# Patient Record
Sex: Female | Born: 1950
Health system: Southern US, Community
[De-identification: ages and names within clinical notes are randomized; demographics above are authoritative.]

## PROBLEM LIST (undated history)

## (undated) DIAGNOSIS — I10 Essential (primary) hypertension: Secondary | ICD-10-CM

## (undated) DIAGNOSIS — F419 Anxiety disorder, unspecified: Secondary | ICD-10-CM

## (undated) DIAGNOSIS — M1990 Unspecified osteoarthritis, unspecified site: Secondary | ICD-10-CM

## (undated) DIAGNOSIS — E079 Disorder of thyroid, unspecified: Secondary | ICD-10-CM

## (undated) DIAGNOSIS — E785 Hyperlipidemia, unspecified: Secondary | ICD-10-CM

## (undated) DIAGNOSIS — K219 Gastro-esophageal reflux disease without esophagitis: Secondary | ICD-10-CM

## (undated) DIAGNOSIS — E039 Hypothyroidism, unspecified: Secondary | ICD-10-CM

## (undated) DIAGNOSIS — S82851A Displaced trimalleolar fracture of right lower leg, initial encounter for closed fracture: Secondary | ICD-10-CM

## (undated) DIAGNOSIS — F32A Depression, unspecified: Secondary | ICD-10-CM

## (undated) HISTORY — PX: ABDOMINAL HYSTERECTOMY: SHX81

## (undated) HISTORY — PX: CHOLECYSTECTOMY: SHX55

## (undated) HISTORY — DX: Hyperlipidemia, unspecified: E78.5

## (undated) HISTORY — DX: Anxiety disorder, unspecified: F41.9

## (undated) HISTORY — DX: Essential (primary) hypertension: I10

## (undated) HISTORY — DX: Disorder of thyroid, unspecified: E07.9

---

## 2000-02-27 ENCOUNTER — Encounter: Payer: Self-pay | Admitting: Family Medicine

## 2000-02-27 ENCOUNTER — Encounter: Admission: RE | Admit: 2000-02-27 | Discharge: 2000-02-27 | Payer: Self-pay | Admitting: Family Medicine

## 2003-03-21 ENCOUNTER — Ambulatory Visit (HOSPITAL_BASED_OUTPATIENT_CLINIC_OR_DEPARTMENT_OTHER): Admission: RE | Admit: 2003-03-21 | Discharge: 2003-03-21 | Payer: Self-pay | Admitting: Orthopedic Surgery

## 2003-05-17 ENCOUNTER — Ambulatory Visit (HOSPITAL_BASED_OUTPATIENT_CLINIC_OR_DEPARTMENT_OTHER): Admission: RE | Admit: 2003-05-17 | Discharge: 2003-05-17 | Payer: Self-pay | Admitting: Orthopedic Surgery

## 2003-05-17 ENCOUNTER — Ambulatory Visit (HOSPITAL_COMMUNITY): Admission: RE | Admit: 2003-05-17 | Discharge: 2003-05-17 | Payer: Self-pay | Admitting: Orthopedic Surgery

## 2004-05-08 ENCOUNTER — Ambulatory Visit: Payer: Self-pay | Admitting: Family Medicine

## 2004-05-14 ENCOUNTER — Ambulatory Visit: Payer: Self-pay | Admitting: Family Medicine

## 2004-06-05 ENCOUNTER — Ambulatory Visit: Payer: Self-pay | Admitting: Family Medicine

## 2004-08-13 ENCOUNTER — Ambulatory Visit: Payer: Self-pay | Admitting: Family Medicine

## 2004-10-01 ENCOUNTER — Ambulatory Visit: Payer: Self-pay | Admitting: Family Medicine

## 2004-12-03 ENCOUNTER — Other Ambulatory Visit: Admission: RE | Admit: 2004-12-03 | Discharge: 2004-12-03 | Payer: Self-pay | Admitting: Obstetrics and Gynecology

## 2004-12-10 ENCOUNTER — Emergency Department (HOSPITAL_COMMUNITY): Admission: EM | Admit: 2004-12-10 | Discharge: 2004-12-10 | Payer: Self-pay | Admitting: Emergency Medicine

## 2004-12-11 ENCOUNTER — Ambulatory Visit: Payer: Self-pay | Admitting: Family Medicine

## 2005-01-02 ENCOUNTER — Ambulatory Visit: Payer: Self-pay | Admitting: Family Medicine

## 2005-05-13 ENCOUNTER — Ambulatory Visit: Payer: Self-pay | Admitting: Family Medicine

## 2005-06-17 ENCOUNTER — Ambulatory Visit: Payer: Self-pay | Admitting: Family Medicine

## 2005-06-23 ENCOUNTER — Other Ambulatory Visit: Admission: RE | Admit: 2005-06-23 | Discharge: 2005-06-23 | Payer: Self-pay | Admitting: Obstetrics and Gynecology

## 2005-08-12 ENCOUNTER — Ambulatory Visit: Payer: Self-pay | Admitting: Family Medicine

## 2005-10-01 ENCOUNTER — Ambulatory Visit: Payer: Self-pay | Admitting: Family Medicine

## 2005-11-18 ENCOUNTER — Ambulatory Visit: Payer: Self-pay | Admitting: Family Medicine

## 2005-12-08 ENCOUNTER — Other Ambulatory Visit: Admission: RE | Admit: 2005-12-08 | Discharge: 2005-12-08 | Payer: Self-pay | Admitting: Obstetrics and Gynecology

## 2006-06-04 ENCOUNTER — Ambulatory Visit: Payer: Self-pay | Admitting: Family Medicine

## 2006-09-01 ENCOUNTER — Ambulatory Visit: Payer: Self-pay | Admitting: Family Medicine

## 2007-03-29 ENCOUNTER — Ambulatory Visit: Payer: Self-pay | Admitting: Internal Medicine

## 2007-04-13 ENCOUNTER — Ambulatory Visit: Payer: Self-pay

## 2010-11-19 NOTE — Assessment & Plan Note (Signed)
[facility]                            CARDIOLOGY OFFICE NOTE   NAME:Copelin, Noely                            MRN:          [id number]  DATE:03/29/2007                            DOB:          16-Oct-1950    Ms. Byrd is a 60 year old who was referred by Dr. Caryl Never for  evaluation of chest pressure.   The patient has no known coronary artery disease.  She notes over the  past year episodes of chest discomfort.  She describes it as a  toothache that occurs on and off.  The episodes usually last about 30  minutes to an hour, not brought on by any particular activity, more  recently with increased frequency.   She has a chronic cough, question if this is exacerbating.  She also  notes over the past 3 years, she has been more tired.   She says she has a high pain tolerance and was concerned, and Dr.  Caryl Never again referred her for further evaluation.   CURRENT MEDICATIONS:  1. Synthroid 75 alternating with 25 daily.  2. Diovan/HCTZ 160/25 daily.  3. Vytorin 10/20.  4. Lexapro 10.   PAST MEDICAL HISTORY:  1. Hypothyroidism.  2. Hypertension.  3. Dyslipidemia.  4. Anxiety/depression.   PAST SURGICAL HISTORY:  1. Status post TAH.  2. Status post cholecystectomy.   SOCIAL HISTORY:  The patient does not smoke, does not drink.   FAMILY HISTORY:  Mother deceased at age 90, unknown causes.  Father with  known CAD, had a CABG at age 60, was a smoker.  Four sisters with  hypertension, arthritis.   REVIEW OF SYSTEMS:  All systems are reviewed, negative to the above  problem except as noted.   PHYSICAL EXAM:  The patient is in no distress.  Blood pressure is 160/100.  Pulse is 68.  Weight 163.  HEENT:  Normocephalic, atraumatic.  EOMI.  PERRL.  Throat clear, nares  clear, mucous membranes moist.  LUNGS:  Clear with no wheezes or rales.  CARDIAC:  Regular rate and rhythm, S1, S2, no S3, S4 or murmurs.  ABDOMEN:  Supple, nontender, no  hepatomegaly, no masses.  EXTREMITIES:  2+ pulses, no edema.   IMPRESSION:  Ms. Muela is a 60 year old woman with risk factors for  coronary artery disease as noted above.  Blood pressure is a little high  today.  It actually has been high in clinic as well.  She comes in for  evaluation of chest pressure that has atypical features but is  concerning since it has progressed, also with fatigability, shortness of  breath.   I would recommend scheduling the patient for a stress Myoview and also  an echocardiogram to evaluate.   In regard to her hypertension, she may need to be on another agent.  She  has, though, said that her blood pressure has been better controlled in  the past.  We will need to follow.  Again, I will be back in touch with  the patient.     Pricilla Riffle, MD, [facility]  Electronically Signed  PVR/MedQ  DD: 03/30/2007  DT: 03/30/2007  Job #: [id number]   cc:   Evelena Peat, M.D.

## 2010-11-22 NOTE — Op Note (Signed)
NAMECRYSTALMARIE, YASIN                             ACCOUNT NO.:  [id number]   MEDICAL RECORD NO.:  [id number]                   PATIENT TYPE:  AMB   LOCATION:  DSC                                  FACILITY:  [facility]   PHYSICIAN:  Cindee Salt, M.D.                    DATE OF BIRTH:  24-Feb-1951   DATE OF PROCEDURE:  03/21/2003  DATE OF DISCHARGE:                                 OPERATIVE REPORT   PREOPERATIVE DIAGNOSIS:  Carpal tunnel syndrome, mass, right wrist.   POSTOPERATIVE DIAGNOSIS:  Carpal tunnel syndrome, mass, right wrist.   OPERATION/PROCEDURE:  Decompression median nerve, right wrist.   SURGEON:  Cindee Salt, M.D.   ASSISTANT:  Abul Imam.   ANESTHESIA:  Forearm based IV regional.   INDICATIONS:  The patient is a 60year-old female with a history of carpal  tunnel syndrome.  EMG nerve conduction is positive.  A mass just proximal to  the wrist crease.  She is desirous for exploration and release.   DESCRIPTION OF PROCEDURE:  The patient was brought to the operating room  where forearm based IV regional anesthetic was carried out without  difficulty.  She was prepped and draped using DuraPrep in the supine  position, right arm free.  A longitudinal incision was made in the palm,  curved to the ulnar aspect, carried down through the subcutaneous tissue.  Bleeders were electrocauterized.  Palmar fascia was split.  Superficial  palmar arch identified.  Flexor tendon to the ring and little finger  identified to the ulnar side of the median nerve.  A carpal retinaculum was  incised with sharp dissection.  Right angle Sewall retractor was placed  between the skin and forearm fascia.  The fascia was released for  approximately 3 cm proximal to the wrist crease under direct vision.  The  canal was explored.  No further lesions were identified.  Tenosynovial  tissue was moderately thickened but not grossly enlarged.  Separate incision  was then made over the mass, carried down through  the subcutaneous tissue.  Bleeders again electrocauterized.  The palmar fascia was noted to be propped  up.  The fatty tissue was present beneath this but did not appear to be a  lipoma.  Using sharp dissection, a fullness in the flexor superficialis with  a very distal muscle belly was noted.  The canal was explored, the median  nerve identified and protected as was the ulnar nerve.  No further lesions  were identified.  Exploration carried down to the pronator.  The wounds were  copiously irrigated with saline and skin closed with interrupted 5-0 nylon  sutures.  Sterile compressive dressing and splint was applied.  The patient  tolerated the procedure well and was taken to the recovery room for  observation in satisfactory condition.   She is discharged to home to return to the [facility]  of [place] in one  week on Vicodin and Keflex.                                               Cindee Salt, M.D.    GK/MEDQ  D:  03/21/2003  T:  03/21/2003  Job:  [id number]

## 2010-11-22 NOTE — Op Note (Signed)
   NAMESHANOAH, ASBILL                             ACCOUNT NO.:  [id number]   MEDICAL RECORD NO.:  [id number]                   PATIENT TYPE:  AMB   LOCATION:  DSC                                  FACILITY:  [facility]   PHYSICIAN:  Cindee Salt, M.D.                    DATE OF BIRTH:  04-02-1951   DATE OF PROCEDURE:  05/17/2003  DATE OF DISCHARGE:                                 OPERATIVE REPORT   PREOPERATIVE DIAGNOSIS:  Carpal tunnel syndrome left hand.   POSTOPERATIVE DIAGNOSIS:  Carpal tunnel syndrome left hand.   OPERATION:  Release of left carpal tunnel.   SURGEON:  Cindee Salt, M.D.   ASSISTANTCarolyne Fiscal.   ANESTHESIA:  Forearm based IV regional.   HISTORY:  The patient is a 60 year old female with a history of carpal  tunnel syndrome. EMG nerve conduction is positive, which has not responded  to conservative treatment.   DESCRIPTION OF PROCEDURE:  The patient was brought to the operating room  with forearm based IV regional anesthetic carried out without difficulty.  She was prepped using Duraprep. In the supine position, the left arm free. A  longitudinal incision was made in the palm and carried down through the  subcutaneous tissue. Bleeders were electrocauterized. The palmar fascia was  split. The superficial palmar arch was identified. The flexor tendon to the  ring and little finger identified to the ulnar side of the median nerve. The  retinaculum was incised with sharp dissection. A right angle and Sewell  retractor were placed between the skin and forearm fascia. The fascia was  released for approximately 3 cm proximal to the wrist crease under direct  vision. The canal was explored. No further lesions were identified. The  wound was irrigated and the skin was closed with interrupted 5-0 nylon  sutures. A sterile compressive dressing and splint was applied. The patient  tolerated the procedure well and was taken to the recovery room for  observation in satisfactory  condition. It was noted that she had a  persistent median artery. This had not thrombosed. The patient is discharged  to home to return to the [facility] of [place] in one week. On Vicodin  and Keflex.                                               Cindee Salt, M.D.    GK/MEDQ  D:  05/17/2003  T:  05/17/2003  Job:  [id number]

## 2011-07-08 HISTORY — PX: CARPAL TUNNEL RELEASE: SHX101

## 2013-12-15 ENCOUNTER — Telehealth: Payer: Self-pay | Admitting: *Deleted

## 2013-12-15 NOTE — Telephone Encounter (Signed)
Called Catherine Mayer back. Neither one of [place] know why she was suppose to call our office.

## 2013-12-15 NOTE — Telephone Encounter (Signed)
Courtney from [facility], Dr. Fidel Levy called,   She said patient told her that they needed to get in contact with you, but she had no idea why?  She would like a call back. CB# [phone]

## 2013-12-19 ENCOUNTER — Encounter: Payer: Self-pay | Admitting: Internal Medicine

## 2013-12-19 ENCOUNTER — Ambulatory Visit (INDEPENDENT_AMBULATORY_CARE_PROVIDER_SITE_OTHER): Payer: BC Managed Care – PPO | Admitting: Internal Medicine

## 2013-12-19 ENCOUNTER — Ambulatory Visit
Admission: RE | Admit: 2013-12-19 | Discharge: 2013-12-19 | Disposition: A | Discharge status: Home / Self Care | Payer: BC Managed Care – PPO | Source: Ambulatory Visit | Attending: Internal Medicine | Admitting: Internal Medicine

## 2013-12-19 VITALS — BP 144/90 | HR 94 | Temp 98.3°F | Resp 16 | Ht 62.0 in | Wt 164.6 lb

## 2013-12-19 DIAGNOSIS — T17308A Unspecified foreign body in larynx causing other injury, initial encounter: Secondary | ICD-10-CM

## 2013-12-19 DIAGNOSIS — E0590 Thyrotoxicosis, unspecified without thyrotoxic crisis or storm: Secondary | ICD-10-CM

## 2013-12-19 DIAGNOSIS — E039 Hypothyroidism, unspecified: Secondary | ICD-10-CM | POA: Insufficient documentation

## 2013-12-19 LAB — TSH: TSH: 0.02 u[IU]/mL — ABNORMAL LOW (ref 0.35–4.50)

## 2013-12-19 LAB — T3, FREE: T3, Free: 3.5 pg/mL (ref 2.3–4.2)

## 2013-12-19 LAB — T4, FREE: Free T4: 1.88 ng/dL — ABNORMAL HIGH (ref 0.60–1.60)

## 2013-12-19 NOTE — Progress Notes (Addendum)
Patient ID: Catherine Mayer, female   DOB: 1950/09/23, 63 y.o.   MRN: [id number]   HPI  Catherine Mayer is a 63 y.o.-year-old female, self-referred in consultation for choking (? goiter/thyroid nodules).  Pt c/o: - + choking - + coughing up phlegm - thick mucus (for a long time) - + sharp pains in L side of neck - denies feeling nodules in neck - + ocasional hoarseness - + dysphagia/no odynophagia - + occasionally SOB with lying down.  Pt describes: - no heat/cold intolerance - + weight gain (50 lbs in last 10 years) - + fatigue - + decreased appetite - + poor sleep - occasionally diarrhea and constipation - + dry skin - + hair falling - + depression  No available TFT results.   She has no FH of thyroid disorders. No FH of thyroid cancer. No h/o radiation tx to head or neck. No recent use of iodine supplements.  ROS: Constitutional: see HPI Eyes: + blurry vision, no xerophthalmia ENT: no sore throat, see HPI Cardiovascular: no CP/+SOB/+ palpitations/no leg swelling Respiratory: + cough/no SOB/+ wheezing Gastrointestinal: no N/V/D/C, + GERD Musculoskeletal: + muscle aches/no joint aches Skin: no rashes, + easy bruising, + itching, + hair loss Neurological: + tremors/no numbness/tingling/dizziness, + HA Psychiatric: + depression/no anxiety  + low libido  PMH: - HTN - HL  - vit D def - GERD  PSxH: - TAH 1977 - cholecystectomy  History   Social History  . Marital Status: Married    Spouse Name: N/A    Number of Children: 1, 944 y/o   Occupational History  . Sales - furniture    Social History Main Topics  . Smoking status: Never Smoker   . Smokeless tobacco: No  . Alcohol Use: No  . Drug Use: No   Current Outpatient Rx  Name  Route  Sig  Dispense  Refill  . escitalopram (LEXAPRO) 20 MG tablet               . LORazepam (ATIVAN) 0.5 MG tablet               . losartan-hydrochlorothiazide (HYZAAR) 100-25 MG per tablet               . omeprazole  (PRILOSEC) 20 MG capsule               . pravastatin (PRAVACHOL) 80 MG tablet               . Vitamin D, Ergocalciferol, (DRISDOL) 50000 UNITS CAPS capsule                FH: - sister with DM - mother, sister, father with HTN, HL - father with heart disease  NKDA  PE: BP 144/90  Pulse 94  Temp(Src) 98.3 F (36.8 C)  Resp 16  Ht 5\' 2"  (1.575 m)  Wt 164 lb 9.6 oz (74.662 kg)  BMI 30.10 kg/m2  SpO2 96% Wt Readings from Last 3 Encounters:  12/19/13 164 lb 9.6 oz (74.662 kg)   Constitutional: overweight, in NAD Eyes: PERRLA, EOMI, no exophthalmos ENT: moist mucous membranes, + slight thyromegaly, no cervical lymphadenopathy Cardiovascular: RRR, No MRG Respiratory: CTA B Gastrointestinal: abdomen soft, NT, ND, BS+ Musculoskeletal: no deformities, strength intact in all 4 Skin: moist, warm, no rashes Neurological: slight tremor with outstretched hands, DTR normal in all 4  ASSESSMENT: 1. Choking - Pt believes her thyroid is causing her choking - would like to check on this  PLAN:  1. Patient with neck compression sxs - I cannot palpate nodules, only slight thyromegaly - we discussed to get a thyroid U/S to check for nodules. If this is normal, we may need a Ba swallow to see if internal or external compression. We scheduled this for today. - we'll check thyroid tests today: TSH, free T4, free T3 - If these are abnormal, she will need to return in 6-8 weeks for repeat labs - we will schedule a new appt if the above labs or U/S are normal  CLINICAL DATA: History of choking. Evaluate for thyroid nodularity.  EXAM: THYROID ULTRASOUND  TECHNIQUE: Ultrasound examination of the thyroid gland and adjacent soft tissues was performed.  COMPARISON: None.  FINDINGS: Right thyroid lobe  Measurements: 3.5 x 0.8 x 1.2 cm. Diffusely heterogeneous thyroid parenchyma. The thyroid is largely hypoechoic with bands of increased echogenicity. No nodules  visualized.  Left thyroid lobe  Measurements: 3.9 x 0.8 x 1.4 cm. Diffusely heterogeneous thyroid parenchyma. The thyroid is largely hypoechoic with bands of increased echogenicity. No nodules visualized.  Isthmus  Thickness: 0.2 cm. No nodules visualized.  Lymphadenopathy  None visualized.  IMPRESSION: 1. No discrete thyroid nodule identified. 2. Diffusely heterogeneous hypoechoic thyroid gland with bands of increased echogenicity. While nonspecific, these findings can be seen in the setting of chronic thyroiditis such as Hashimoto's thyroiditis.   Electronically Signed By: Malachy MoanHeath McCullough M.D. On: 12/19/2013 11:32  Based on the results above, will add a TPO Abs level to the labs drawn, since the U/S suggests Hashimoto's thyroiditis. Will also need the barium swallow (ordered).  Component     Latest Ref Rng 12/19/2013  TSH     0.35 - 4.50 uIU/mL 0.02 (L)  Free T4     0.60 - 1.60 ng/dL 4.091.88 (H)  T3, Free     2.3 - 4.2 pg/mL 3.5  TPO Abs not added.  Likely thyroiditis >> will suggest pt to use Ibuprofen to decrease swelling and will recheck the TFTs + TPO Abs + TSI Abs + ATA in 3 weeks.  01/17/2014  CLINICAL DATA: History of choking, fullness in the throat  EXAM: ESOPHOGRAM / BARIUM SWALLOW / BARIUM TABLET STUDY  TECHNIQUE: Combined double contrast and single contrast examination performed using effervescent crystals, thick barium liquid, and thin barium liquid. The patient was observed with fluoroscopy swallowing a 13mm barium sulphate tablet.  FLUOROSCOPY TIME: 1 min 42 seconds  COMPARISON: None.  FINDINGS: With the history of coughing after swallowing, the study was begun in the lateral projection. Rapid sequence spot films show no evidence of aspiration or penetration. The swallowing mechanism is unremarkable. A double contrast study shows the mucosa of the esophagus to be normal. Esophageal peristalsis is normal. However, there is a small hiatal  hernia present. Moderate gastroesophageal reflux is demonstrated. A barium pill was given at the end of the study which passed into the stomach without delay.  IMPRESSION: 1. Small hiatal hernia with moderate gastroesophageal reflux. 2. Barium pill passes into the stomach without delay. 3. No evidence of aspiration   Electronically Signed By: Dwyane DeePaul Barry M.D. On: 01/17/2014 13:26  No extrinsic Esophageal compression on Ba Esophagogram. Chocking possibly from GERD. She is on Protonix.  Will FWD to PCP.

## 2013-12-19 NOTE — Patient Instructions (Addendum)
Please stop at the lab. We will call you with the results. We scheduled your thyroid U/S for today at 10:00 am. After this, we may need a barium swallow test, but let's wait for the results of the U/S and the thyroid tests. We will schedule a new appointment if the labs or ultrasound are abnormal.

## 2013-12-29 DIAGNOSIS — T17308A Unspecified foreign body in larynx causing other injury, initial encounter: Secondary | ICD-10-CM | POA: Insufficient documentation

## 2014-01-16 ENCOUNTER — Other Ambulatory Visit: Payer: BC Managed Care – PPO

## 2014-01-17 ENCOUNTER — Ambulatory Visit
Admission: RE | Admit: 2014-01-17 | Discharge: 2014-01-17 | Disposition: A | Discharge status: Home / Self Care | Payer: BC Managed Care – PPO | Source: Ambulatory Visit | Attending: Internal Medicine | Admitting: Internal Medicine

## 2014-01-18 ENCOUNTER — Encounter: Payer: Self-pay | Admitting: *Deleted

## 2014-01-20 ENCOUNTER — Telehealth: Payer: Self-pay | Admitting: Internal Medicine

## 2014-01-20 NOTE — Telephone Encounter (Signed)
Called pt and advised her per Dr Charlean SanfilippoGherghe's note. Pt understood and said she will be here.

## 2014-01-20 NOTE — Telephone Encounter (Signed)
Yes, to see how she is feeling and repeat all the labs.

## 2014-01-20 NOTE — Telephone Encounter (Signed)
Since pts labs came back good does she still need to come in on 7/28

## 2014-01-20 NOTE — Telephone Encounter (Signed)
Please read note below and advise.  

## 2014-01-31 ENCOUNTER — Other Ambulatory Visit: Payer: Self-pay | Admitting: Internal Medicine

## 2014-01-31 ENCOUNTER — Encounter: Payer: Self-pay | Admitting: Internal Medicine

## 2014-01-31 ENCOUNTER — Ambulatory Visit (INDEPENDENT_AMBULATORY_CARE_PROVIDER_SITE_OTHER): Payer: BC Managed Care – PPO | Admitting: Internal Medicine

## 2014-01-31 VITALS — BP 174/104 | HR 77 | Temp 97.9°F | Resp 12 | Wt 166.0 lb

## 2014-01-31 DIAGNOSIS — Z5189 Encounter for other specified aftercare: Secondary | ICD-10-CM

## 2014-01-31 DIAGNOSIS — T17308A Unspecified foreign body in larynx causing other injury, initial encounter: Secondary | ICD-10-CM

## 2014-01-31 DIAGNOSIS — T17308D Unspecified foreign body in larynx causing other injury, subsequent encounter: Secondary | ICD-10-CM

## 2014-01-31 DIAGNOSIS — E0590 Thyrotoxicosis, unspecified without thyrotoxic crisis or storm: Secondary | ICD-10-CM

## 2014-01-31 DIAGNOSIS — E039 Hypothyroidism, unspecified: Secondary | ICD-10-CM

## 2014-01-31 LAB — TSH: TSH: 3.507 u[IU]/mL (ref 0.350–4.500)

## 2014-01-31 LAB — T4, FREE: Free T4: 1.04 ng/dL (ref 0.80–1.80)

## 2014-01-31 MED ORDER — LEVOTHYROXINE SODIUM 125 MCG PO TABS: 125.0000 ug | ORAL_TABLET | Freq: Every day | ORAL | Status: DC

## 2014-01-31 NOTE — Patient Instructions (Signed)
Please separate the Synthroid from Omeprazole and take Synthroid in am, fasting, with water. Please stop at the lab downstairs [facility]  Please come back for a follow-up appointment in 3 months

## 2014-01-31 NOTE — Progress Notes (Signed)
Patient ID: Catherine Mayer, female   DOB: 12-06-1950, 63 y.o.   MRN: [id number]   HPI  Catherine Mayer is a 63 y.o.-year-old female, returning for f/u for hypothyroidism and choking. Lats visit 1.5 mo ago.   Pt still c/o choking. - denies feeling nodules in neck - + ocasional hoarseness - + dysphagia/no odynophagia - + occasionally SOB with lying down.  At last visit, we checked a thyroid U/S (no nodules) and a barium swallow test (no exogenous compression on the esophagus, only a small hiatal hernia). It is possible that her neck compression sxs can be fluctuating ~ amount of inflammation in her thyroid - she had a heterogeneous aspect of her thyroid on U/S suggesting thyroiditis, likely Hashimoto's thyroiditis.   She tells me that she believes her choking may have a nervous component >> she felt this after getting out of the car to come to the appt.  Hypothyroidism: One thing that she forgot to mention at our last appt is that she was on Synthroid 125 mcg daily! This med did not appear in her med list and did not tell me she was on it.   She now tells me that she was dx'ed with hypothyroidism 6 years ago >> started Synthroid (DAW) - 125 mcg. She is still taking this dose now: - at bedtime (2-3 hours after dinner)! - with water - takes it along with Prilosec!  Previous TFT results were reviewed; Component     Latest Ref Rng 12/19/2013  TSH     0.35 - 4.50 uIU/mL 0.02 (L)  Free T4     0.60 - 1.60 ng/dL 1.91 (H)  T3, Free     2.3 - 4.2 pg/mL 3.5   Pt describes: - no heat/cold intolerance - + weight gain (50 lbs in last 10 years) - + fatigue - + poor sleep - occasionally diarrhea and constipation - + dry skin - + hair falling - + depression  ROS: Constitutional: see HPI Eyes: no blurry vision, no xerophthalmia ENT: no sore throat, see HPI Cardiovascular: no CP/SOB/no palpitations/no leg swelling Respiratory: no cough/no SOB/wheezing Gastrointestinal: no N/V/D/C, +  GERD Musculoskeletal: no muscle aches/no joint aches Skin: no rashes, + easy bruising Neurological: + tremors/no numbness/tingling/dizziness  I reviewed pt's medications, allergies, PMH, social hx, family hx and no changes required, except as mentioned above.  PE: BP 174/104  Pulse 77  Temp(Src) 97.9 F (36.6 C) (Oral)  Resp 12  Wt 166 lb (75.297 kg)  SpO2 95% Wt Readings from Last 3 Encounters:  01/31/14 166 lb (75.297 kg)  12/19/13 164 lb 9.6 oz (74.662 kg)   Constitutional: overweight, in NAD Eyes: PERRLA, EOMI, no exophthalmos ENT: moist mucous membranes, + slight thyromegaly, no cervical lymphadenopathy Cardiovascular: RRR, No MRG Respiratory: CTA B Gastrointestinal: abdomen soft, NT, ND, BS+ Musculoskeletal: no deformities, strength intact in all 4 Skin: moist, warm, no rashes Neurological: slight tremor with outstretched hands, DTR normal in all 4  ASSESSMENT: 1. Choking - Pt believes her thyroid is causing her choking - we performed the following tests:  THYROID ULTRASOUND (12/19/2013)  FINDINGS: Right thyroid lobe  Measurements: 3.5 x 0.8 x 1.2 cm. Diffusely heterogeneous thyroid parenchyma. The thyroid is largely hypoechoic with bands of increased echogenicity. No nodules visualized.  Left thyroid lobe  Measurements: 3.9 x 0.8 x 1.4 cm. Diffusely heterogeneous thyroid parenchyma. The thyroid is largely hypoechoic with bands of increased echogenicity. No nodules visualized.  Isthmus  Thickness: 0.2 cm. No nodules visualized.  Lymphadenopathy  None visualized.  IMPRESSION: 1. No discrete thyroid nodule identified. 2. Diffusely heterogeneous hypoechoic thyroid gland with bands of increased echogenicity. While nonspecific, these findings can be seen in the setting of chronic thyroiditis such as Hashimoto's thyroiditis.  Based on the results above, will add a TPO Abs level to the labs drawn, since the U/S suggests Hashimoto's  thyroiditis.  ESOPHOGRAM / BARIUM SWALLOW / BARIUM TABLET STUDY (01/17/2014)  FINDINGS: With the history of coughing after swallowing, the study was begun in the lateral projection. Rapid sequence spot films show no evidence of aspiration or penetration. The swallowing mechanism is unremarkable. A double contrast study shows the mucosa of the esophagus to be normal. Esophageal peristalsis is normal. However, there is a small hiatal hernia present. Moderate gastroesophageal reflux is demonstrated. A barium pill was given at the end of the study which passed into the stomach without delay.  IMPRESSION: 1. Small hiatal hernia with moderate gastroesophageal reflux. 2. Barium pill passes into the stomach without delay. 3. No evidence of aspiration  No extrinsic Esophageal compression on Ba Esophagogram.   2. Hypothyroidism - On DAW Synthroid 125 mcg  Component     Latest Ref Rng 12/19/2013  TSH     0.35 - 4.50 uIU/mL 0.02 (L)  Free T4     0.60 - 1.60 ng/dL 1.611.88 (H)  T3, Free     2.3 - 4.2 pg/mL 3.5    PLAN:  1. Choking - Patient with neck compression sxs - thyroid U/S shows no nodules; Ba swallow shows no compression. Possible nervous component.  2. Hypothyroidism - per last TFT check, her TSH is oversuppressed on her current Synthroid dose. This is also in the context of taking the PPI along with the Synthroid after dinner! - we discussed about risk of having an overly suppressed TSH: tachycardia, arrhythmia, hypercoagulability >> blood clots, osteoporosis, + hyperthyroid sxs. - we discussed about correct intake of thyroid hh: EVERY DAY, with water, >30 min before b'fast, separated by >4h from anti acid medication, calcium, iron, MVI. - will check thyroid tests today: TSH, free T4 - If these are abnormal, which I suspect, she will need to return in 6-8 weeks for repeat labs, after we change the Synthroid dose - we will schedule a new appt in 3 mo  Orders Only on 01/31/2014   Component Date Value Ref Range Status  . TSH 01/31/2014 3.507  0.350 - 4.500 uIU/mL Final  . Free T4 01/31/2014 1.04  0.80 - 1.80 ng/dL Final   Since TSH a little higher in the normal range >> we will continue the same dose of Synthroid (125 mcg), but separating it from the PPI >> we will need to recheck labs in 5-6 weeks.

## 2014-05-01 ENCOUNTER — Ambulatory Visit: Payer: BC Managed Care – PPO | Admitting: Internal Medicine

## 2015-02-14 ENCOUNTER — Other Ambulatory Visit (HOSPITAL_COMMUNITY): Payer: Self-pay | Admitting: Emergency Medicine

## 2015-02-14 ENCOUNTER — Ambulatory Visit (HOSPITAL_COMMUNITY)
Admission: RE | Admit: 2015-02-14 | Discharge: 2015-02-14 | Disposition: A | Discharge status: Home / Self Care | Payer: Self-pay | Source: Ambulatory Visit | Attending: Emergency Medicine | Admitting: Emergency Medicine

## 2015-02-14 DIAGNOSIS — R29810 Facial weakness: Secondary | ICD-10-CM

## 2016-04-22 ENCOUNTER — Encounter: Payer: Self-pay | Admitting: Family Medicine

## 2016-05-05 ENCOUNTER — Other Ambulatory Visit: Payer: Self-pay | Admitting: Obstetrics and Gynecology

## 2016-05-05 DIAGNOSIS — R928 Other abnormal and inconclusive findings on diagnostic imaging of breast: Secondary | ICD-10-CM

## 2016-05-07 ENCOUNTER — Other Ambulatory Visit: Payer: Self-pay | Admitting: Obstetrics and Gynecology

## 2016-05-07 ENCOUNTER — Ambulatory Visit
Admission: RE | Admit: 2016-05-07 | Discharge: 2016-05-07 | Disposition: A | Discharge status: Home / Self Care | Payer: Medicare Other | Source: Ambulatory Visit | Attending: Obstetrics and Gynecology | Admitting: Obstetrics and Gynecology

## 2016-05-07 DIAGNOSIS — R928 Other abnormal and inconclusive findings on diagnostic imaging of breast: Secondary | ICD-10-CM

## 2016-05-12 ENCOUNTER — Other Ambulatory Visit: Payer: Self-pay | Admitting: Obstetrics and Gynecology

## 2016-05-12 DIAGNOSIS — R928 Other abnormal and inconclusive findings on diagnostic imaging of breast: Secondary | ICD-10-CM

## 2016-05-13 ENCOUNTER — Ambulatory Visit
Admission: RE | Admit: 2016-05-13 | Discharge: 2016-05-13 | Disposition: A | Discharge status: Home / Self Care | Payer: Medicare Other | Source: Ambulatory Visit | Attending: Obstetrics and Gynecology | Admitting: Obstetrics and Gynecology

## 2016-05-13 DIAGNOSIS — R928 Other abnormal and inconclusive findings on diagnostic imaging of breast: Secondary | ICD-10-CM

## 2016-07-22 DIAGNOSIS — E559 Vitamin D deficiency, unspecified: Secondary | ICD-10-CM | POA: Insufficient documentation

## 2016-07-22 DIAGNOSIS — E785 Hyperlipidemia, unspecified: Secondary | ICD-10-CM | POA: Insufficient documentation

## 2016-07-22 DIAGNOSIS — F3342 Major depressive disorder, recurrent, in full remission: Secondary | ICD-10-CM | POA: Insufficient documentation

## 2016-07-22 DIAGNOSIS — I10 Essential (primary) hypertension: Secondary | ICD-10-CM | POA: Insufficient documentation

## 2018-06-11 ENCOUNTER — Ambulatory Visit: Payer: Medicare Other | Admitting: Diagnostic Neuroimaging

## 2018-07-02 ENCOUNTER — Emergency Department (HOSPITAL_COMMUNITY)
Admission: EM | Admit: 2018-07-02 | Discharge: 2018-07-02 | Disposition: A | Discharge status: Home / Self Care | Payer: Medicare Other | Attending: Emergency Medicine | Admitting: Emergency Medicine

## 2018-07-02 ENCOUNTER — Encounter (HOSPITAL_COMMUNITY): Payer: Self-pay | Admitting: Emergency Medicine

## 2018-07-02 ENCOUNTER — Emergency Department (HOSPITAL_COMMUNITY): Payer: Medicare Other

## 2018-07-02 DIAGNOSIS — E876 Hypokalemia: Secondary | ICD-10-CM | POA: Diagnosis not present

## 2018-07-02 DIAGNOSIS — Z79899 Other long term (current) drug therapy: Secondary | ICD-10-CM | POA: Insufficient documentation

## 2018-07-02 DIAGNOSIS — F410 Panic disorder [episodic paroxysmal anxiety] without agoraphobia: Secondary | ICD-10-CM | POA: Insufficient documentation

## 2018-07-02 DIAGNOSIS — E039 Hypothyroidism, unspecified: Secondary | ICD-10-CM | POA: Diagnosis not present

## 2018-07-02 DIAGNOSIS — R0602 Shortness of breath: Secondary | ICD-10-CM | POA: Diagnosis present

## 2018-07-02 LAB — BASIC METABOLIC PANEL
Anion gap: 14 (ref 5–15)
BUN: 23 mg/dL (ref 8–23)
CHLORIDE: 99 mmol/L (ref 98–111)
CO2: 26 mmol/L (ref 22–32)
Calcium: 8.4 mg/dL — ABNORMAL LOW (ref 8.9–10.3)
Creatinine, Ser: 0.99 mg/dL (ref 0.44–1.00)
GFR calc Af Amer: >60 mL/min (ref >60)
GFR calc non Af Amer: 59 mL/min — ABNORMAL LOW (ref >60)
GLUCOSE: 108 mg/dL — AB (ref 70–99)
POTASSIUM: 2.8 mmol/L — AB (ref 3.5–5.1)
Sodium: 139 mmol/L (ref 135–145)

## 2018-07-02 LAB — I-STAT TROPONIN, ED: Troponin i, poc: 0 ng/mL (ref 0.00–0.08)

## 2018-07-02 LAB — CBC
HEMATOCRIT: 38.4 % (ref 36.0–46.0)
HEMOGLOBIN: 13.3 g/dL (ref 12.0–15.0)
MCH: 32.2 pg (ref 26.0–34.0)
MCHC: 34.6 g/dL (ref 30.0–36.0)
MCV: 93 fL (ref 80.0–100.0)
Platelets: 208 10*3/uL (ref 150–400)
RBC: 4.13 MIL/uL (ref 3.87–5.11)
RDW: 12.4 % (ref 11.5–15.5)
WBC: 5.6 10*3/uL (ref 4.0–10.5)
nRBC: 0 % (ref 0.0–0.2)

## 2018-07-02 MED ORDER — POTASSIUM CHLORIDE ER 10 MEQ PO TBCR: 20.0000 meq | EXTENDED_RELEASE_TABLET | Freq: Every day | ORAL | 0 refills | Status: DC

## 2018-07-02 MED ORDER — LORAZEPAM 0.5 MG PO TABS: 0.5000 mg | ORAL_TABLET | Freq: Every day | ORAL | 0 refills | Status: DC | PRN

## 2018-07-02 MED ORDER — POTASSIUM CHLORIDE CRYS ER 20 MEQ PO TBCR: 40.0000 meq | EXTENDED_RELEASE_TABLET | Freq: Once | ORAL | Status: DC

## 2018-07-02 NOTE — ED Provider Notes (Signed)
[facility] Provider Note   CSN: [id number] Arrival date & time: 07/02/18  1616     History   Chief Complaint Chief Complaint  Patient presents with  . Shortness of Breath    HPI Catherine Mayer Comes is a 67 y.o. female.  Pt presents to the ED today with sob.  The pt has had a viral GI bug (N/v/d) for the past few days.  Today, she woke up from sleep around 0400 and felt like she could not get her breath.  She has a hx of anxiety and felt like this was a panic attack.  She said she breathed through it, and it went away.  She saw her pcp today for the vomiting and diarrhea.  She was given zofran and was able to tolerate gatorade.  She was feeling better, but then the same thing happened around 1400.  She breathed through it again, and it went away.  She felt like she should get it checked out.  She feels fine now.     History reviewed. No pertinent past medical history.  Patient Active Problem List   Diagnosis Date Noted  . Choking 12/29/2013  . Hypothyroidism 12/19/2013    Past Surgical History:  Procedure Laterality Date  . ABDOMINAL HYSTERECTOMY    . CHOLECYSTECTOMY       OB History   No obstetric history on file.      Home Medications    Prior to Admission medications   Medication Sig Start Date End Date Taking? Authorizing Provider  escitalopram (LEXAPRO) 20 MG tablet Take 20 mg by mouth daily.  11/29/13  Yes [provider]  levothyroxine (SYNTHROID) 125 MCG tablet Take 1 tablet (125 mcg total) by mouth daily before breakfast. 01/31/14  Yes Carlus PavlovGherghe, Cristina, MD  losartan-hydrochlorothiazide (HYZAAR) 100-25 MG per tablet Take 1 tablet by mouth daily.  11/25/13  Yes [provider]  meloxicam (MOBIC) 15 MG tablet Take 15 mg by mouth daily as needed for pain. 03/09/18  Yes [provider]  omeprazole (PRILOSEC) 20 MG capsule Take 20 mg by mouth daily.  11/29/13  Yes [provider]  ondansetron  (ZOFRAN-ODT) 8 MG disintegrating tablet Take 8 mg by mouth every 8 (eight) hours as needed for nausea/vomiting. 07/02/18 07/09/18 Yes [provider]  pravastatin (PRAVACHOL) 80 MG tablet Take 80 mg by mouth daily.  10/30/13  Yes [provider]  Vitamin D, Ergocalciferol, (DRISDOL) 50000 UNITS CAPS capsule Take 50,000 Units by mouth every 7 (seven) days. SUNDAYS 10/28/13  Yes [provider]  LORazepam (ATIVAN) 0.5 MG tablet Take 1 tablet (0.5 mg total) by mouth daily as needed for anxiety. 07/02/18   Jacalyn LefevreHaviland, Lileigh Fahringer, MD  potassium chloride (K-DUR) 10 MEQ tablet Take 2 tablets (20 mEq total) by mouth daily. 07/02/18   Jacalyn LefevreHaviland, Aizlynn Digilio, MD    Family History No family history on file.  Social History Social History   Tobacco Use  . Smoking status: Never Smoker  . Smokeless tobacco: Never Used  Substance Use Topics  . Alcohol use: Not on file  . Drug use: Never     Allergies   Patient has no known allergies.   Review of Systems Review of Systems  Respiratory: Positive for shortness of breath.   All other systems reviewed and are negative.    Physical Exam Updated Vital Signs BP (!) 165/111 (BP Location: Right Arm)   Pulse 88   Temp 98.9 F (37.2 C) (Oral)   Resp  18   Ht 5' (1.524 m)   Wt 72.6 kg   SpO2 96%   BMI 31.25 kg/m   Physical Exam Vitals signs and nursing note reviewed.  Constitutional:      Appearance: She is well-developed.  HENT:     Head: Normocephalic and atraumatic.     Mouth/Throat:     Mouth: Mucous membranes are moist.     Pharynx: Oropharynx is clear.  Eyes:     Extraocular Movements: Extraocular movements intact.     Pupils: Pupils are equal, round, and reactive to light.  Neck:     Musculoskeletal: Normal range of motion and neck supple.  Cardiovascular:     Rate and Rhythm: Normal rate.  Pulmonary:     Effort: Pulmonary effort is normal.     Breath sounds: Normal breath sounds.  Abdominal:     General: Bowel  sounds are normal.     Palpations: Abdomen is soft.  Musculoskeletal: Normal range of motion.  Skin:    General: Skin is warm and dry.     Capillary Refill: Capillary refill takes less than 2 seconds.  Neurological:     General: No focal deficit present.     Mental Status: She is alert and oriented to person, place, and time.  Psychiatric:        Mood and Affect: Mood normal.        Behavior: Behavior normal.      ED Treatments / Results  Labs (all labs ordered are listed, but only abnormal results are displayed) Labs Reviewed  BASIC METABOLIC PANEL - Abnormal; Notable for the following components:      Result Value   Potassium 2.8 (*)    Glucose, Bld 108 (*)    Calcium 8.4 (*)    GFR calc non Af Amer 59 (*)    All other components within normal limits  CBC  I-STAT TROPONIN, ED    EKG EKG Interpretation  Date/Time:  Friday July 02 2018 16:22:37 EST Ventricular Rate:  89 PR Interval:  144 QRS Duration: 82 QT Interval:  380 QTC Calculation: 462 R Axis:   76 Text Interpretation:  Normal sinus rhythm Cannot rule out Anterior infarct , age undetermined Abnormal ECG No old tracing to compare Confirmed by Jacalyn Lefevre [phone]) on 07/02/2018 9:55:26 PM   Radiology Dg Chest 2 View  Result Date: 07/02/2018 CLINICAL DATA:  Shortness of breath today, worse lying down EXAM: CHEST - 2 VIEW COMPARISON:  None. FINDINGS: No active infiltrate or effusion is seen. Mediastinal and hilar contours are unremarkable. The heart is mildly enlarged. There are mild degenerative changes in the mid to lower thoracic spine. IMPRESSION: No active lung disease.  Mild cardiomegaly. Electronically Signed   By: Dwyane Dee M.D.   On: 07/02/2018 17:03    Procedures Procedures (including critical care time)  Medications Ordered in ED Medications  potassium chloride SA (K-DUR,KLOR-CON) CR tablet 40 mEq (has no administration in time range)     Initial Impression / Assessment and Plan / ED  Course  I have reviewed the triage vital signs and the nursing notes.  Pertinent labs & imaging results that were available during my care of the patient were reviewed by me and considered in my medical decision making (see chart for details).     Pt's story is atypical for CAD.  She did not have associated CP.  Last episode of SOB occurred 8 hours ago.  EKG, CXR, troponin nl.  Potassium is low,  likely from the diarrhea.  Pt will be given a dose here and instructed to take it for the next 2 weeks.  She is given a rx for ativan 0.5 mg (#10) if it happens again and the breathing does not help.  If she develops cp or worsening sob, she is instructed to return to the Ed.  Final Clinical Impressions(s) / ED Diagnoses   Final diagnoses:  Hypokalemia  Panic attack    ED Discharge Orders         Ordered    potassium chloride (K-DUR) 10 MEQ tablet  Daily     07/02/18 2212    LORazepam (ATIVAN) 0.5 MG tablet  Daily PRN     07/02/18 2212           Jacalyn LefevreHaviland, Pricsilla Lindvall, MD 07/02/18 2216

## 2018-07-02 NOTE — ED Notes (Signed)
RN Victorino DikeJennifer informed of elevated BP

## 2018-07-02 NOTE — ED Triage Notes (Signed)
Pt reports that she has been having SOB worsening when laying down intermittently but it has been worse today. States she has been recovering from a stomach virus. States she went to her doctors office and the sent her here for further evaluation.

## 2018-07-13 ENCOUNTER — Ambulatory Visit: Payer: Medicare Other | Admitting: Diagnostic Neuroimaging

## 2018-09-13 DIAGNOSIS — F411 Generalized anxiety disorder: Secondary | ICD-10-CM | POA: Insufficient documentation

## 2018-09-13 DIAGNOSIS — F410 Panic disorder [episodic paroxysmal anxiety] without agoraphobia: Secondary | ICD-10-CM | POA: Insufficient documentation

## 2019-05-17 DIAGNOSIS — Z1231 Encounter for screening mammogram for malignant neoplasm of breast: Secondary | ICD-10-CM | POA: Diagnosis not present

## 2019-05-17 DIAGNOSIS — L900 Lichen sclerosus et atrophicus: Secondary | ICD-10-CM | POA: Diagnosis not present

## 2019-05-18 ENCOUNTER — Encounter: Payer: Self-pay | Admitting: Family Medicine

## 2019-05-18 ENCOUNTER — Other Ambulatory Visit: Payer: Self-pay

## 2019-05-18 ENCOUNTER — Ambulatory Visit (INDEPENDENT_AMBULATORY_CARE_PROVIDER_SITE_OTHER): Payer: Medicare Other | Admitting: Family Medicine

## 2019-05-18 VITALS — BP 188/112 | HR 98 | Temp 98.6°F | Ht 60.0 in | Wt 168.0 lb

## 2019-05-18 DIAGNOSIS — F410 Panic disorder [episodic paroxysmal anxiety] without agoraphobia: Secondary | ICD-10-CM

## 2019-05-18 DIAGNOSIS — F411 Generalized anxiety disorder: Secondary | ICD-10-CM | POA: Diagnosis not present

## 2019-05-18 DIAGNOSIS — Z79899 Other long term (current) drug therapy: Secondary | ICD-10-CM

## 2019-05-18 DIAGNOSIS — E039 Hypothyroidism, unspecified: Secondary | ICD-10-CM | POA: Diagnosis not present

## 2019-05-18 DIAGNOSIS — M79651 Pain in right thigh: Secondary | ICD-10-CM

## 2019-05-18 DIAGNOSIS — Z23 Encounter for immunization: Secondary | ICD-10-CM | POA: Diagnosis not present

## 2019-05-18 DIAGNOSIS — I10 Essential (primary) hypertension: Secondary | ICD-10-CM | POA: Diagnosis not present

## 2019-05-18 DIAGNOSIS — Z7689 Persons encountering health services in other specified circumstances: Secondary | ICD-10-CM

## 2019-05-18 DIAGNOSIS — E782 Mixed hyperlipidemia: Secondary | ICD-10-CM

## 2019-05-18 MED ORDER — LORAZEPAM 0.5 MG PO TABS: 0.5000 mg | ORAL_TABLET | Freq: Every day | ORAL | 1 refills | Status: DC | PRN

## 2019-05-18 MED ORDER — AMLODIPINE BESYLATE 5 MG PO TABS
5.0000 mg | ORAL_TABLET | Freq: Every day | ORAL | 3 refills | Status: DC
Start: 2019-05-18 — End: 2020-02-01

## 2019-05-18 NOTE — Patient Instructions (Addendum)
You had labs performed today.  You will be contacted with the results of the labs once they are available, usually in the next 3 business days for routine lab work.  If you have an active my chart account, they will be released to your MyChart.  If you prefer to have these labs released to you via telephone, please let us know.  If you had a pap smear or biopsy performed, expect to be contacted in about 7-10 days.  We talked about the risks of benzodiazepine use today including development of dementia, falls, life-threatening breathing problems.  Controlled Substance Guidelines:  1. You cannot get an early refill, even it is lost.  2. You cannot get controlled medications from any other doctor, unless it is the emergency department and related to a new problem or injury.  3. You cannot use alcohol, marijuana, cocaine or any other recreational drugs while using this medication. This is very dangerous.  4. You are willing to have your urine drug tested at each visit.  5. You will not drive while using this medication, because that can put yourself and others in serious danger of an accident. 6. If any medication is stolen, then there must be a police report to verify it, or it cannot be refilled.  7. I will not prescribe these medications for longer than 3 months.  8. You must bring your pill bottle to each visit.  9. You must use the same pharmacy for all refills for the medication, unless you clear it with me beforehand.  10. You cannot share or sell this medication.

## 2019-05-18 NOTE — Progress Notes (Signed)
Subjective: JM:EQASTMHDQ care, generalized anxiety disorder with panic, right thigh pain HPI: Catherine Mayer is a 68 y.o. female presenting to clinic today for:  1.  Generalized anxiety disorder with panic Patient reports longstanding history of generalized anxiety disorder.  She also has situational anxiety for which she uses lorazepam.  She identifies her husband as the reason that she uses this intermittently.  She admits that he is an alcoholic.  Sometimes, he becomes very frustrating and she uses the medication to calm down.  She uses this rarely and certainly not every day.  Denies excessive sedation, change in memory, falls.  She does not use alcohol or exogenous substances.  She is also treated with max dose of Lexapro.  No history of hospitalization for mental health disorder.  No known family history of mental health disorder.  2.  Right thigh pain Patient reports about a year history of a right posterior thigh pain.  Denies any preceding injury.  She has been evaluated by orthopedics and had an MRI which she reports was negative.  She was never referred to physical therapy.  She uses Mobic intermittently with no significant relief.  3.  Hypothyroidism Several year history of hypothyroidism.  She notes that this was found incidentally on laboratory work-up.  Denies any history of radiation or surgery to the neck.  No known family history of thyroid disorder.  She denies any change in voice, difficulty swallowing, tremors, heart palpitation, unplanned weight change or change in bowel movements  4.  Hypertension Patient reports compliance with Hyzaar 100-25 mg daily.  No chest pain, shortness of breath, dizziness, headaches or lower extremity edema.  She does not monitor her blood pressure at home.  Past Medical History:  Diagnosis Date  . Anxiety   . Hyperlipidemia   . Hypertension   . Thyroid disease    Past Surgical History:  Procedure Laterality Date  . ABDOMINAL HYSTERECTOMY     . CHOLECYSTECTOMY     Social History   Socioeconomic History  . Marital status: Married    Spouse name: Not on file  . Number of children: Not on file  . Years of education: Not on file  . Highest education level: Not on file  Occupational History  . Not on file  Social Needs  . Financial resource strain: Not on file  . Food insecurity    Worry: Not on file    Inability: Not on file  . Transportation needs    Medical: Not on file    Non-medical: Not on file  Tobacco Use  . Smoking status: Never Smoker  . Smokeless tobacco: Never Used  Substance and Sexual Activity  . Alcohol use: Not Currently  . Drug use: Never  . Sexual activity: Not on file  Lifestyle  . Physical activity    Days per week: Not on file    Minutes per session: Not on file  . Stress: Not on file  Relationships  . Social Musician on phone: Not on file    Gets together: Not on file    Attends religious service: Not on file    Active member of club or organization: Not on file    Attends meetings of clubs or organizations: Not on file    Relationship status: Not on file  . Intimate partner violence    Fear of current or ex partner: Not on file    Emotionally abused: Not on file    Physically abused: Not  on file    Forced sexual activity: Not on file  Other Topics Concern  . Not on file  Social History Narrative  . Not on file   Current Meds  Medication Sig  . escitalopram (LEXAPRO) 20 MG tablet Take 20 mg by mouth daily.   Marland Kitchen. levothyroxine (SYNTHROID) 125 MCG tablet Take 1 tablet (125 mcg total) by mouth daily before breakfast.  . LORazepam (ATIVAN) 0.5 MG tablet Take by mouth.  . losartan-hydrochlorothiazide (HYZAAR) 100-25 MG per tablet Take 1 tablet by mouth daily.   . meloxicam (MOBIC) 15 MG tablet Take 15 mg by mouth daily as needed for pain.  Marland Kitchen. omeprazole (PRILOSEC) 20 MG capsule Take 20 mg by mouth daily.   . pravastatin (PRAVACHOL) 80 MG tablet Take 80 mg by mouth daily.     Family History  Problem Relation Age of Onset  . Hypertension Mother   . Hyperlipidemia Mother   . Hypertension Father    No Known Allergies   Health Maintenance: Mammo/ Pap w/ Dr Jackelyn Knifemeisinger.  Will schedule.  Completed ROI for colonoscopy results. ROS: Per HPI  Objective: Office vital signs reviewed. BP (!) 188/112   Pulse 98   Temp 98.6 F (37 C) (Oral)   Ht 5' (1.524 m)   Wt 168 lb (76.2 kg)   SpO2 99%   BMI 32.81 kg/m   Physical Examination:  General: Awake, alert, well nourished, No acute distress HEENT: Normal; no goiter.  No exophthalmos Cardio: regular rate and rhythm, S1S2 heard, no murmurs appreciated Pulm: clear to auscultation bilaterally, no wheezes, rhonchi or rales; normal work of breathing on room air Extremities: warm, well perfused, No edema, cyanosis or clubbing; +2 pulses bilaterally MSK: normal gait and station; right posterior thigh without any deformity. Skin: Normal temperature Neuro: No tremor.  Alert and oriented.  No focal neurologic deficits Psych: Mood stable, speech normal, affect appropriate, pleasant and interactive Depression screen [facility] 05/18/2019  Decreased Interest 0  Down, Depressed, Hopeless 0  PHQ - 2 Score 0  Altered sleeping 1  Tired, decreased energy 0  Change in appetite 0  Feeling bad or failure about yourself  0  Trouble concentrating 0  Moving slowly or fidgety/restless 0  Suicidal thoughts 0  PHQ-9 Score 1   GAD 7 : Generalized Anxiety Score 05/18/2019  Nervous, Anxious, on Edge 1  Control/stop worrying 1  Worry too much - different things 1  Trouble relaxing 0  Restless 1  Easily annoyed or irritable 0  Afraid - awful might happen 0  Total GAD 7 Score 4  Anxiety Difficulty Somewhat difficult    Assessment/ Plan: 68 y.o. female   1. Generalized anxiety disorder with panic attacks Sparing use of of benzodiazepine.  We discussed the risks of the medication today.  Continue SSRI.  Controlled substance contract  and UDS obtained today.  The Narcotic Database has been reviewed.  There were no red flags.  - DRUG SCREEN-TOXASSURE - LORazepam (ATIVAN) 0.5 MG tablet; Take 1 tablet (0.5 mg total) by mouth daily as needed for anxiety.  Dispense: 90 tablet; Refill: 1  2. Controlled substance agreement signed - DRUG SCREEN-TOXASSURE  3. Hypothyroidism, unspecified type Asymptomatic.  Check thyroid panel - Thyroid Panel With TSH  4. Establishing care with new doctor, encounter for  5. Essential hypertension with goal blood pressure less than 130/80 Not controlled.  I have added amlodipine.  She will follow-up in 2 weeks for with RN for blood pressure recheck - amLODipine (NORVASC)  5 MG tablet; Take 1 tablet (5 mg total) by mouth daily.  Dispense: 90 tablet; Refill: 3  6. Mixed hyperlipidemia Continue statin  7. Right thigh pain Uncertain etiology.  MRI not visible in database.  Question strain?  It appears that she was seen by Dr. Gladstone Lighter previously.  I recommended possible eval by sports medicine to evaluate gait, possible need for shoe inserts.  She may benefit from eval under ultrasound as well. - Ambulatory referral to [facility], [place] [phone]

## 2019-05-19 LAB — THYROID PANEL WITH TSH
Free Thyroxine Index: 2.1 (ref 1.2–4.9)
T3 Uptake Ratio: 26 % (ref 24–39)
T4, Total: 8 ug/dL (ref 4.5–12.0)
TSH: 5.14 u[IU]/mL — ABNORMAL HIGH (ref 0.450–4.500)

## 2019-05-21 LAB — TOXASSURE SELECT 13 (MW), URINE

## 2019-05-31 ENCOUNTER — Other Ambulatory Visit: Payer: Self-pay

## 2019-06-01 ENCOUNTER — Ambulatory Visit: Payer: Medicare Other

## 2019-06-01 VITALS — BP 145/98 | HR 88

## 2019-06-01 DIAGNOSIS — I10 Essential (primary) hypertension: Secondary | ICD-10-CM

## 2019-06-01 NOTE — Progress Notes (Signed)
Patient notified to increase Norvasc to 10 mg per day.  She will call back to schedule appointment for blood pressure recheck in 2-4 weeks.

## 2019-06-01 NOTE — Progress Notes (Signed)
Patient here today for blood pressure check.  Blood pressure and pulse 155/96, 87. Rechecked at 145/98, 88.

## 2019-06-06 ENCOUNTER — Telehealth: Payer: Self-pay | Admitting: Family Medicine

## 2019-06-06 NOTE — Telephone Encounter (Signed)
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  This person was exposed to Covid and has been quarantined from her full time job for 14 days.  The person came to work at her part-time job even though she was supposed to be quarantined from her full time job.  Patient's questions is, shouldn't this person also be quarantining from her part time job.  In response, I answered yes that if she is quarantined from her full time job she should also quarantine herself from her part time job.

## 2019-06-13 ENCOUNTER — Ambulatory Visit: Payer: Medicare Other | Admitting: Family Medicine

## 2019-06-21 ENCOUNTER — Ambulatory Visit: Payer: Medicare Other | Admitting: Family Medicine

## 2019-07-14 ENCOUNTER — Encounter: Payer: Self-pay | Admitting: Family Medicine

## 2019-07-14 ENCOUNTER — Ambulatory Visit (INDEPENDENT_AMBULATORY_CARE_PROVIDER_SITE_OTHER): Payer: Medicare Other | Admitting: Family Medicine

## 2019-07-14 ENCOUNTER — Other Ambulatory Visit: Payer: Self-pay

## 2019-07-14 DIAGNOSIS — M79671 Pain in right foot: Secondary | ICD-10-CM | POA: Diagnosis not present

## 2019-07-14 DIAGNOSIS — M722 Plantar fascial fibromatosis: Secondary | ICD-10-CM

## 2019-07-14 MED ORDER — DICLOFENAC SODIUM 1 % EX GEL: 2.0000 g | Freq: Four times a day (QID) | CUTANEOUS | 1 refills | Status: DC

## 2019-07-14 MED ORDER — PREDNISONE 20 MG PO TABS
ORAL_TABLET | ORAL | 0 refills | Status: DC
Start: 2019-07-14 — End: 2019-08-17

## 2019-07-14 NOTE — Patient Instructions (Signed)
Plantar Fasciitis  Plantar fasciitis is a painful foot condition that affects the heel. It occurs when the band of tissue that connects the toes to the heel bone (plantar fascia) becomes irritated. This can happen as the result of exercising too much or doing other repetitive activities (overuse injury). The pain from plantar fasciitis can range from mild irritation to severe pain that makes it difficult to walk or move. The pain is usually worse in the morning after sleeping, or after sitting or lying down for a while. Pain may also be worse after long periods of walking or standing. What are the causes? This condition may be caused by:  Standing for long periods of time.  Wearing shoes that do not have good arch support.  Doing activities that put stress on joints (high-impact activities), including running, aerobics, and ballet.  Being overweight.  An abnormal way of walking (gait).  Tight muscles in the back of your lower leg (calf).  High arches in your feet.  Starting a new athletic activity. What are the signs or symptoms? The main symptom of this condition is heel pain. Pain may:  Be worse with first steps after a time of rest, especially in the morning after sleeping or after you have been sitting or lying down for a while.  Be worse after long periods of standing still.  Decrease after 30-45 minutes of activity, such as gentle walking. How is this diagnosed? This condition may be diagnosed based on your medical history and your symptoms. Your health care provider may ask questions about your activity level. Your health care provider will do a physical exam to check for:  A tender area on the bottom of your foot.  A high arch in your foot.  Pain when you move your foot.  Difficulty moving your foot. You may have imaging tests to confirm the diagnosis, such as:  X-rays.  Ultrasound.  MRI. How is this treated? Treatment for plantar fasciitis depends on how  severe your condition is. Treatment may include:  Rest, ice, applying pressure (compression), and raising the affected foot (elevation). This may be called RICE therapy. Your health care provider may recommend RICE therapy along with over-the-counter pain medicines to manage your pain.  Exercises to stretch your calves and your plantar fascia.  A splint that holds your foot in a stretched, upward position while you sleep (night splint).  Physical therapy to relieve symptoms and prevent problems in the future.  Injections of steroid medicine (cortisone) to relieve pain and inflammation.  Stimulating your plantar fascia with electrical impulses (extracorporeal shock wave therapy). This is usually the last treatment option before surgery.  Surgery, if other treatments have not worked after 12 months. Follow these instructions at home:  Managing pain, stiffness, and swelling  If directed, put ice on the painful area: ? Put ice in a plastic bag, or use a frozen bottle of water. ? Place a towel between your skin and the bag or bottle. ? Roll the bottom of your foot over the bag or bottle. ? Do this for 20 minutes, 2-3 times a day.  Wear athletic shoes that have air-sole or gel-sole cushions, or try wearing soft shoe inserts that are designed for plantar fasciitis.  Raise (elevate) your foot above the level of your heart while you are sitting or lying down. Activity  Avoid activities that cause pain. Ask your health care provider what activities are safe for you.  Do physical therapy exercises and stretches as told   by your health care provider.  Try activities and forms of exercise that are easier on your joints (low-impact). Examples include swimming, water aerobics, and biking. General instructions  Take over-the-counter and prescription medicines only as told by your health care provider.  Wear a night splint while sleeping, if told by your health care provider. Loosen the splint  if your toes tingle, become numb, or turn cold and blue.  Maintain a healthy weight, or work with your health care provider to lose weight as needed.  Keep all follow-up visits as told by your health care provider. This is important. Contact a health care provider if you:  Have symptoms that do not go away after caring for yourself at home.  Have pain that gets worse.  Have pain that affects your ability to move or do your daily activities. Summary  Plantar fasciitis is a painful foot condition that affects the heel. It occurs when the band of tissue that connects the toes to the heel bone (plantar fascia) becomes irritated.  The main symptom of this condition is heel pain that may be worse after exercising too much or standing still for a long time.  Treatment varies, but it usually starts with rest, ice, compression, and elevation (RICE therapy) and over-the-counter medicines to manage pain. This information is not intended to replace advice given to you by your health care provider. Make sure you discuss any questions you have with your health care provider. Document Revised: 06/05/2017 Document Reviewed: 04/20/2017 [employer] Patient Education  2020 [employer]  

## 2019-07-14 NOTE — Progress Notes (Signed)
Virtual Visit via telephone Note Due to COVID-19 pandemic this visit was conducted virtually. This visit type was conducted due to national recommendations for restrictions regarding the COVID-19 Pandemic (e.g. social distancing, sheltering in place) in an effort to limit this patient's exposure and mitigate transmission in our community. All issues noted in this document were discussed and addressed.  A physical exam was not performed with this format.   I connected with Catherine Mayer on 07/14/2019 at 1435 by telephone and verified that I am speaking with the correct person using two identifiers. Catherine Mayer is currently located at work and no one is currently with them during visit. The provider, Kari Baars, FNP is located in their office at time of visit.  I discussed the limitations, risks, security and privacy concerns of performing an evaluation and management service by telephone and the availability of in person appointments. I also discussed with the patient that there may be a patient responsible charge related to this service. The patient expressed understanding and agreed to proceed.  Subjective:  Patient ID: Catherine Mayer, female    DOB: 13-Dec-1950, 69 y.o.   MRN: [id number]  Chief Complaint:  Foot Pain   HPI: Catherine Mayer is a 69 y.o. female presenting on 07/14/2019 for Foot Pain   Pt complains of right heel pain that started several days ago. States the pain is unbearable at times. States the pain is worse in the mornings and after sitting for long periods of time. States it hurts to walk and bear weight. No known injury.   Foot Pain This is a new problem. The current episode started in the past 7 days. The problem occurs daily. The problem has been waxing and waning. Associated symptoms include arthralgias and myalgias. Pertinent negatives include no abdominal pain, anorexia, change in bowel habit, chest pain, chills, congestion, coughing, diaphoresis, fatigue, fever, headaches, joint  swelling, nausea, neck pain, numbness, rash, sore throat, swollen glands, urinary symptoms, vertigo, visual change, vomiting or weakness. The symptoms are aggravated by walking. She has tried rest and position changes for the symptoms. The treatment provided no relief.     Relevant past medical, surgical, family, and social history reviewed and updated as indicated.  Allergies and medications reviewed and updated.   Past Medical History:  Diagnosis Date  . Anxiety   . Hyperlipidemia   . Hypertension   . Thyroid disease     Past Surgical History:  Procedure Laterality Date  . ABDOMINAL HYSTERECTOMY    . CHOLECYSTECTOMY      Social History   Socioeconomic History  . Marital status: Married    Spouse name: Not on file  . Number of children: Not on file  . Years of education: Not on file  . Highest education level: Not on file  Occupational History  . Not on file  Tobacco Use  . Smoking status: Never Smoker  . Smokeless tobacco: Never Used  Substance and Sexual Activity  . Alcohol use: Not Currently  . Drug use: Never  . Sexual activity: Not on file  Other Topics Concern  . Not on file  Social History Narrative  . Not on file   Social Determinants of Health   Financial Resource Strain:   . Difficulty of Paying Living Expenses: Not on file  Food Insecurity:   . Worried About Programme researcher, broadcasting/film/video in the Last Year: Not on file  . Ran Out of Food in the Last Year: Not on file  Transportation Needs:   .  Lack of Transportation (Medical): Not on file  . Lack of Transportation (Non-Medical): Not on file  Physical Activity:   . Days of Exercise per Week: Not on file  . Minutes of Exercise per Session: Not on file  Stress:   . Feeling of Stress : Not on file  Social Connections:   . Frequency of Communication with Friends and Family: Not on file  . Frequency of Social Gatherings with Friends and Family: Not on file  . Attends Religious Services: Not on file  . Active  Member of Clubs or Organizations: Not on file  . Attends Banker Meetings: Not on file  . Marital Status: Not on file  Intimate Partner Violence:   . Fear of Current or Ex-Partner: Not on file  . Emotionally Abused: Not on file  . Physically Abused: Not on file  . Sexually Abused: Not on file    Outpatient Encounter Medications as of 07/14/2019  Medication Sig  . amLODipine (NORVASC) 5 MG tablet Take 1 tablet (5 mg total) by mouth daily.  . diclofenac Sodium (VOLTAREN) 1 % GEL Apply 2 g topically 4 (four) times daily.  Marland Kitchen escitalopram (LEXAPRO) 20 MG tablet Take 20 mg by mouth daily.   Marland Kitchen levothyroxine (SYNTHROID) 125 MCG tablet Take 1 tablet (125 mcg total) by mouth daily before breakfast.  . LORazepam (ATIVAN) 0.5 MG tablet Take 1 tablet (0.5 mg total) by mouth daily as needed for anxiety.  Marland Kitchen losartan-hydrochlorothiazide (HYZAAR) 100-25 MG per tablet Take 1 tablet by mouth daily.   . meloxicam (MOBIC) 15 MG tablet Take 15 mg by mouth daily as needed for pain.  Marland Kitchen omeprazole (PRILOSEC) 20 MG capsule Take 20 mg by mouth daily.   . potassium chloride (K-DUR) 10 MEQ tablet Take 2 tablets (20 mEq total) by mouth daily. (Patient not taking: Reported on 05/18/2019)  . pravastatin (PRAVACHOL) 80 MG tablet Take 80 mg by mouth daily.   . predniSONE (DELTASONE) 20 MG tablet 2 po at sametime daily for 5 days   No facility-administered encounter medications on file as of 07/14/2019.    No Known Allergies  Review of Systems  Constitutional: Negative for activity change, appetite change, chills, diaphoresis, fatigue, fever and unexpected weight change.  HENT: Negative.  Negative for congestion and sore throat.   Eyes: Negative.   Respiratory: Negative for cough, chest tightness and shortness of breath.   Cardiovascular: Negative for chest pain, palpitations and leg swelling.  Gastrointestinal: Negative for abdominal pain, anorexia, blood in stool, change in bowel habit, constipation,  diarrhea, nausea and vomiting.  Endocrine: Negative.   Genitourinary: Negative for dysuria, frequency and urgency.  Musculoskeletal: Positive for arthralgias, gait problem and myalgias. Negative for joint swelling and neck pain.  Skin: Negative.  Negative for rash.  Allergic/Immunologic: Negative.   Neurological: Negative for dizziness, vertigo, weakness, numbness and headaches.  Hematological: Negative.   Psychiatric/Behavioral: Negative for confusion, hallucinations, sleep disturbance and suicidal ideas.  All other systems reviewed and are negative.        Observations/Objective: No vital signs or physical exam, this was a telephone or virtual health encounter.  Pt alert and oriented, answers all questions appropriately, and able to speak in full sentences.    Assessment and Plan: Catherine Mayer was seen today for foot pain.  Diagnoses and all orders for this visit:  Right foot pain Plantar fasciitis of right foot Reported symptoms of heel pain that is worse first thing in the morning and after resting consistent with  plantar fascitis. Last GFR 59, will give burst of steroids and topical NSAID therapy. Stretching exercises discussed in detail with pt. Pt aware to wear good arch supports. Follow up if symptoms persist or worsen, may need trigger point injection or referral to podiatry.  -     predniSONE (DELTASONE) 20 MG tablet; 2 po at sametime daily for 5 days -     diclofenac Sodium (VOLTAREN) 1 % GEL; Apply 2 g topically 4 (four) times daily.     Follow Up Instructions: Return if symptoms worsen or fail to improve.    I discussed the assessment and treatment plan with the patient. The patient was provided an opportunity to ask questions and all were answered. The patient agreed with the plan and demonstrated an understanding of the instructions.   The patient was advised to call back or seek an in-person evaluation if the symptoms worsen or if the condition fails to improve as  anticipated.  The above assessment and management plan was discussed with the patient. The patient verbalized understanding of and has agreed to the management plan. Patient is aware to call the clinic if they develop any new symptoms or if symptoms persist or worsen. Patient is aware when to return to the clinic for a follow-up visit. Patient educated on when it is appropriate to go to the emergency department.    I provided 15 minutes of non-face-to-face time during this encounter. The call started at 1435. The call ended at 1450. The other time was used for coordination of care.    Monia Pouch, FNP-C [facility] [address] [place],  [address] [phone] 07/14/2019

## 2019-08-02 DIAGNOSIS — M9261 Juvenile osteochondrosis of tarsus, right ankle: Secondary | ICD-10-CM | POA: Diagnosis not present

## 2019-08-02 DIAGNOSIS — M7731 Calcaneal spur, right foot: Secondary | ICD-10-CM | POA: Diagnosis not present

## 2019-08-02 DIAGNOSIS — M7661 Achilles tendinitis, right leg: Secondary | ICD-10-CM | POA: Diagnosis not present

## 2019-08-02 DIAGNOSIS — M79671 Pain in right foot: Secondary | ICD-10-CM | POA: Diagnosis not present

## 2019-08-02 DIAGNOSIS — M25579 Pain in unspecified ankle and joints of unspecified foot: Secondary | ICD-10-CM | POA: Diagnosis not present

## 2019-08-15 DIAGNOSIS — M7661 Achilles tendinitis, right leg: Secondary | ICD-10-CM | POA: Diagnosis not present

## 2019-08-15 DIAGNOSIS — M79671 Pain in right foot: Secondary | ICD-10-CM | POA: Diagnosis not present

## 2019-08-17 ENCOUNTER — Ambulatory Visit: Payer: Medicare Other

## 2019-08-17 ENCOUNTER — Other Ambulatory Visit: Payer: Self-pay

## 2019-08-17 ENCOUNTER — Encounter: Payer: Self-pay | Admitting: Family Medicine

## 2019-08-17 ENCOUNTER — Ambulatory Visit (INDEPENDENT_AMBULATORY_CARE_PROVIDER_SITE_OTHER): Payer: Medicare Other | Admitting: Family Medicine

## 2019-08-17 ENCOUNTER — Telehealth: Payer: Self-pay | Admitting: Family Medicine

## 2019-08-17 VITALS — BP 103/68 | HR 92 | Temp 98.0°F | Ht 60.0 in

## 2019-08-17 DIAGNOSIS — E782 Mixed hyperlipidemia: Secondary | ICD-10-CM | POA: Diagnosis not present

## 2019-08-17 DIAGNOSIS — E039 Hypothyroidism, unspecified: Secondary | ICD-10-CM | POA: Diagnosis not present

## 2019-08-17 DIAGNOSIS — Z1211 Encounter for screening for malignant neoplasm of colon: Secondary | ICD-10-CM | POA: Diagnosis not present

## 2019-08-17 DIAGNOSIS — Z780 Asymptomatic menopausal state: Secondary | ICD-10-CM | POA: Diagnosis not present

## 2019-08-17 DIAGNOSIS — I10 Essential (primary) hypertension: Secondary | ICD-10-CM

## 2019-08-17 NOTE — Progress Notes (Signed)
Subjective: CC: F/u thyroid disorder, HTN, HLD HPI: Catherine Mayer is a 69 y.o. female presenting to clinic today for:  1.  Hypothyroidism Several year history of hypothyroidism. No history of radiation or surgery to the neck.  No known family history of thyroid disorder.    She reports compliance with her levothyroxine 125 mcg daily and takes it appropriately.  No use of biotin.  She notes that it has become quite unaffordable lately.  The generic is costing $80 or the brand is well over 100.  This is to her mail order.  She is wondering if there are alternatives to this medicine.  She denies any change in voice, difficulty swallowing, tremors, heart palpitation, unplanned weight change or change in bowel movements  2.  Hypertension Patient reports compliance with Hyzaar 100-25 mg daily and Norvasc 10 mg daily.  No chest pain, shortness of breath, dizziness, headaches or lower extremity edema.  She has been monitoring her blood pressure with a home wrist cuff that is quite we will hold.  Her blood pressures at home are reading 140s over 70s to 80s.  Nothing above 150 over 90s.  Past Medical History:  Diagnosis Date  . Anxiety   . Hyperlipidemia   . Hypertension   . Thyroid disease    Past Surgical History:  Procedure Laterality Date  . ABDOMINAL HYSTERECTOMY    . CHOLECYSTECTOMY     Social History   Socioeconomic History  . Marital status: Married    Spouse name: Not on file  . Number of children: Not on file  . Years of education: Not on file  . Highest education level: Not on file  Occupational History  . Not on file  Tobacco Use  . Smoking status: Never Smoker  . Smokeless tobacco: Never Used  Substance and Sexual Activity  . Alcohol use: Not Currently  . Drug use: Never  . Sexual activity: Not on file  Other Topics Concern  . Not on file  Social History Narrative  . Not on file   Social Determinants of Health   Financial Resource Strain:   . Difficulty of Paying  Living Expenses: Not on file  Food Insecurity:   . Worried About Charity fundraiser in the Last Year: Not on file  . Ran Out of Food in the Last Year: Not on file  Transportation Needs:   . Lack of Transportation (Medical): Not on file  . Lack of Transportation (Non-Medical): Not on file  Physical Activity:   . Days of Exercise per Week: Not on file  . Minutes of Exercise per Session: Not on file  Stress:   . Feeling of Stress : Not on file  Social Connections:   . Frequency of Communication with Friends and Family: Not on file  . Frequency of Social Gatherings with Friends and Family: Not on file  . Attends Religious Services: Not on file  . Active Member of Clubs or Organizations: Not on file  . Attends Archivist Meetings: Not on file  . Marital Status: Not on file  Intimate Partner Violence:   . Fear of Current or Ex-Partner: Not on file  . Emotionally Abused: Not on file  . Physically Abused: Not on file  . Sexually Abused: Not on file   Current Meds  Medication Sig  . amLODipine (NORVASC) 5 MG tablet Take 1 tablet (5 mg total) by mouth daily.  . diclofenac Sodium (VOLTAREN) 1 % GEL Apply 2 g topically 4 (  four) times daily.  Marland Kitchen escitalopram (LEXAPRO) 20 MG tablet Take 20 mg by mouth daily.   Marland Kitchen ibuprofen (ADVIL) 600 MG tablet Take 600 mg by mouth 3 (three) times daily.  Marland Kitchen levothyroxine (SYNTHROID) 125 MCG tablet Take 1 tablet (125 mcg total) by mouth daily before breakfast.  . LORazepam (ATIVAN) 0.5 MG tablet Take 1 tablet (0.5 mg total) by mouth daily as needed for anxiety.  Marland Kitchen losartan-hydrochlorothiazide (HYZAAR) 100-25 MG per tablet Take 1 tablet by mouth daily.   . meloxicam (MOBIC) 15 MG tablet Take 15 mg by mouth daily as needed for pain.  Marland Kitchen omeprazole (PRILOSEC) 20 MG capsule Take 20 mg by mouth daily.   . pravastatin (PRAVACHOL) 80 MG tablet Take 80 mg by mouth daily.    Family History  Problem Relation Age of Onset  . Hypertension Mother   .  Hyperlipidemia Mother   . Hypertension Father    No Known Allergies   Health Maintenance: Mammo/ Pap w/ Dr Willis Modena.  Will schedule.  Completed ROI for colonoscopy results. ROS: Per HPI  Objective: Office vital signs reviewed. BP 103/68   Pulse 92   Temp 98 F (36.7 C) (Temporal)   Ht 5' (1.524 m)   SpO2 97%   BMI 32.81 kg/m   Physical Examination:  General: Awake, alert, well nourished, No acute distress HEENT: Normal; no goiter.  No exophthalmos Cardio: regular rate and rhythm, S1S2 heard, no murmurs appreciated Pulm: clear to auscultation bilaterally, no wheezes, rhonchi or rales; normal work of breathing on room air Extremities: warm, well perfused, No edema, cyanosis or clubbing; +2 radial pulses bilaterally MSK: she has RLE in a walking boot. Skin: Normal temperature Neuro: No tremor.  Alert and oriented.  No focal neurologic deficits Psych: Mood stable, speech normal, affect appropriate, pleasant and interactive Depression screen [facility] 2/9 08/17/2019 05/18/2019  Decreased Interest 1 0  Down, Depressed, Hopeless 1 0  PHQ - 2 Score 2 0  Altered sleeping 1 1  Tired, decreased energy 0 0  Change in appetite 1 0  Feeling bad or failure about yourself  0 0  Trouble concentrating 1 0  Moving slowly or fidgety/restless 0 0  Suicidal thoughts 0 0  PHQ-9 Score 5 1  Difficult doing work/chores Not difficult at all -   GAD 7 : Generalized Anxiety Score 08/17/2019 05/18/2019  Nervous, Anxious, on Edge 1 1  Control/stop worrying 1 1  Worry too much - different things 1 1  Trouble relaxing 0 0  Restless 0 1  Easily annoyed or irritable 0 0  Afraid - awful might happen 1 0  Total GAD 7 Score 4 4  Anxiety Difficulty Not difficult at all Somewhat difficult    Assessment/ Plan: 69 y.o. female   1. Hypothyroidism, unspecified type Check thyroid levels.  I will reach out to her pharmacy to determine if Euthyrox might be cheaper than current levothyroxine. ?  Deductible that  she might have to meet - Thyroid Panel With TSH  2. Essential hypertension with goal blood pressure less than 130/80 A little too controlled.  She borders hypotension.  For this reason I have recommended that she reduce her Norvasc back to 1 tablet daily and continue the Hyzaar at current dose.  We discussed goal blood pressure less than 150/90.  Encouraged her to replace the wrist cuff with a new brachial cuff for better readings. - CMP14+EGFR  3. Mixed hyperlipidemia Continue statin.  Check nonfasting lipid panel - CMP14+EGFR - Lipid Panel  4. Asymptomatic postmenopausal estrogen deficiency Plan for DEXA. - DG [facility] DEXA; Future  5. Screen for colon cancer I have referred her back to her gastroenterologist at [facility] for colonoscopy - Ambulatory referral to Gastroenterology   Janora Norlander, [place] [phone]

## 2019-08-17 NOTE — Patient Instructions (Signed)
Go back to taking ONE tablet of Amlodipine daily.  Goal BP <150/90.  If it starts going back up above this level, go to 1.5 tablets of Amlodipine.  The 2 tablets seem to be too strong.   I will work on figuring out your thyroid medication.  Plan for bone density scan soon.  Catherine Mayer will call you to arrange  I have ordered referral to [facility] for your colon cancer screen.  You had labs performed today.  You will be contacted with the results of the labs once they are available, usually in the next 3 business days for routine lab work.  If you have an active my chart account, they will be released to your MyChart.  If you prefer to have these labs released to you via telephone, please let us know.  If you had a pap smear or biopsy performed, expect to be contacted in about 7-10 days.

## 2019-08-18 LAB — CMP14+EGFR
ALT: 24 IU/L (ref 0–32)
AST: 23 IU/L (ref 0–40)
Albumin/Globulin Ratio: 1.8 (ref 1.2–2.2)
Albumin: 4.4 g/dL (ref 3.8–4.8)
Alkaline Phosphatase: 91 IU/L (ref 39–117)
BUN/Creatinine Ratio: 16 (ref 12–28)
BUN: 15 mg/dL (ref 8–27)
Bilirubin Total: 0.6 mg/dL (ref 0.0–1.2)
CO2: 24 mmol/L (ref 20–29)
Calcium: 9.2 mg/dL (ref 8.7–10.3)
Chloride: 97 mmol/L (ref 96–106)
Creatinine, Ser: 0.92 mg/dL (ref 0.57–1.00)
GFR calc Af Amer: 74 mL/min/{1.73_m2} (ref >59)
GFR calc non Af Amer: 64 mL/min/{1.73_m2} (ref >59)
Globulin, Total: 2.5 g/dL (ref 1.5–4.5)
Glucose: 88 mg/dL (ref 65–99)
Potassium: 3.3 mmol/L — ABNORMAL LOW (ref 3.5–5.2)
Sodium: 140 mmol/L (ref 134–144)
Total Protein: 6.9 g/dL (ref 6.0–8.5)

## 2019-08-18 LAB — LIPID PANEL
Chol/HDL Ratio: 4.6 ratio — ABNORMAL HIGH (ref 0.0–4.4)
Cholesterol, Total: 199 mg/dL (ref 100–199)
HDL: 43 mg/dL (ref >39)
LDL Chol Calc (NIH): 114 mg/dL — ABNORMAL HIGH (ref 0–99)
Triglycerides: 240 mg/dL — ABNORMAL HIGH (ref 0–149)
VLDL Cholesterol Cal: 42 mg/dL — ABNORMAL HIGH (ref 5–40)

## 2019-08-18 LAB — THYROID PANEL WITH TSH
Free Thyroxine Index: 1.9 (ref 1.2–4.9)
T3 Uptake Ratio: 24 % (ref 24–39)
T4, Total: 7.9 ug/dL (ref 4.5–12.0)
TSH: 8.81 u[IU]/mL — ABNORMAL HIGH (ref 0.450–4.500)

## 2019-08-18 MED ORDER — LEVOTHYROXINE SODIUM 137 MCG PO TABS: 137.0000 ug | ORAL_TABLET | Freq: Every day | ORAL | 0 refills | Status: DC

## 2019-08-18 NOTE — Addendum Note (Signed)
Addended by: [name] on: 08/18/2019 11:05 AM   Modules accepted: Orders

## 2019-08-29 ENCOUNTER — Ambulatory Visit (INDEPENDENT_AMBULATORY_CARE_PROVIDER_SITE_OTHER): Payer: Medicare Other | Admitting: *Deleted

## 2019-08-29 DIAGNOSIS — M79671 Pain in right foot: Secondary | ICD-10-CM | POA: Diagnosis not present

## 2019-08-29 DIAGNOSIS — Z0000 Encounter for general adult medical examination without abnormal findings: Secondary | ICD-10-CM

## 2019-08-29 DIAGNOSIS — M7661 Achilles tendinitis, right leg: Secondary | ICD-10-CM | POA: Diagnosis not present

## 2019-08-29 NOTE — Progress Notes (Addendum)
MEDICARE ANNUAL WELLNESS VISIT  08/29/2019  Telephone Visit Disclaimer This Medicare AWV was conducted by telephone due to national recommendations for restrictions regarding the COVID-19 Pandemic (e.g. social distancing).  I verified, using two identifiers, that I am speaking with Catherine Mayer or their authorized healthcare agent. I discussed the limitations, risks, security, and privacy concerns of performing an evaluation and management service by telephone and the potential availability of an in-person appointment in the future. The patient expressed understanding and agreed to proceed.   Subjective:  Catherine Mayer is a 69 y.o. female patient of Catherine Norlander, DO who had a Medicare Annual Wellness Visit today via telephone. 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 she has 1 son. she reports that she is socially active and does interact with friends/family regularly. she is minimally physically active and enjoys the outdoors and playing with dogs weather permitting.  Patient Care Team: Catherine Norlander, DO as PCP - General (Family Medicine) Cheri Fowler, MD as Consulting Physician (Obstetrics and Gynecology)  Advanced Directives 08/29/2019 07/02/2018  Does Patient Have a Medical Advance Directive? No No  Would patient like information on creating a medical advance directive? No - Patient declined No - Patient declined    Hospital Utilization Over the Past 12 Months: # of hospitalizations or ER visits: 0 # of surgeries: 0  Review of Systems    Patient reports that her overall health is unchanged compared to last year.  History obtained from the patient and chart.   Patient Reported Readings (BP, Pulse, CBG, Weight, etc) none  Pain Assessment Pain : No/denies pain     Current Medications & Allergies (verified) Allergies as of 08/29/2019   No Known Allergies      Medication List        Accurate as of August 29, 2019  9:55 AM.  If you have any questions, ask your nurse or doctor.          STOP taking these medications    diclofenac Sodium 1 % Gel Commonly known as: Voltaren   potassium chloride 10 MEQ tablet Commonly known as: KLOR-CON       TAKE these medications    amLODipine 5 MG tablet Commonly known as: NORVASC Take 1 tablet (5 mg total) by mouth daily.   escitalopram 20 MG tablet Commonly known as: LEXAPRO Take 20 mg by mouth daily.   ibuprofen 600 MG tablet Commonly known as: ADVIL Take 600 mg by mouth 3 (three) times daily.   levothyroxine 137 MCG tablet Commonly known as: Synthroid Take 1 tablet (137 mcg total) by mouth daily before breakfast. Recheck levels in 2-3 months   LORazepam 0.5 MG tablet Commonly known as: ATIVAN Take 1 tablet (0.5 mg total) by mouth daily as needed for anxiety.   losartan-hydrochlorothiazide 100-25 MG tablet Commonly known as: HYZAAR Take 1 tablet by mouth daily.   meloxicam 15 MG tablet Commonly known as: MOBIC Take 15 mg by mouth daily as needed for pain.   omeprazole 20 MG capsule Commonly known as: PRILOSEC Take 20 mg by mouth daily.   pravastatin 80 MG tablet Commonly known as: PRAVACHOL Take 80 mg by mouth daily.        History (reviewed): Past Medical History:  Diagnosis Date   Anxiety    Hyperlipidemia    Hypertension    Thyroid disease    Past Surgical History:  Procedure Laterality Date   ABDOMINAL HYSTERECTOMY  CARPAL TUNNEL RELEASE Bilateral 2013   CHOLECYSTECTOMY     Family History  Problem Relation Age of Onset   Hypertension Mother    Hyperlipidemia Mother    Hypertension Father    Hypertension Sister    Hypertension Son    Hyperlipidemia Son    Hypertension Sister    Hypertension Sister    Social History   Socioeconomic History   Marital status: Married    Spouse name: Catherine Mayer   Number of children: 1   Years of education: Not on file   Highest education level: Associate degree: occupational,  Scientist, product/process development, or vocational program  Occupational History   Occupation: Control and instrumentation engineer    Comment: part time  Tobacco Use   Smoking status: Never Smoker   Smokeless tobacco: Never Used  Substance and Sexual Activity   Alcohol use: Not Currently   Drug use: Never   Sexual activity: Not on file  Other Topics Concern   Not on file  Social History Narrative   Enjoys playing with dogs and being outside.    Social Determinants of Health   Financial Resource Strain:    Difficulty of Paying Living Expenses: Not on file  Food Insecurity:    Worried About Programme researcher, broadcasting/film/video in the Last Year: Not on file   [employer] of Food in the Last Year: Not on file  Transportation Needs:    Lack of Transportation (Medical): Not on file   Lack of Transportation (Non-Medical): Not on file  Physical Activity:    Days of Exercise per Week: Not on file   Minutes of Exercise per Session: Not on file  Stress:    Feeling of Stress : Not on file  Social Connections:    Frequency of Communication with Friends and Family: Not on file   Frequency of Social Gatherings with Friends and Family: Not on file   Attends Religious Services: Not on file   Active Member of Clubs or Organizations: Not on file   Attends Banker Meetings: Not on file   Marital Status: Not on file    Activities of Daily Living In your present state of health, do you have any difficulty performing the following activities: 08/29/2019  Hearing? N  Vision? N  Difficulty concentrating or making decisions? N  Walking or climbing stairs? N  Dressing or bathing? N  Doing errands, shopping? N  Preparing Food and eating ? N  Using the Toilet? N  In the past six months, have you accidently leaked urine? Y  Comment stress incontinence  Do you have problems with loss of bowel control? N  Managing your Medications? N  Managing your Finances? N  Housekeeping or managing your Housekeeping? N  Some recent data might be hidden     Patient Education/ Literacy How often do you need to have someone help you when you read instructions, pamphlets, or other written materials from your doctor or pharmacy?: 1 - Never  Exercise Current Exercise Habits: The patient does not participate in regular exercise at present, Exercise limited by: None identified  Diet Patient reports consuming 2 meals a day and 2 snack(s) a day Patient reports that her primary diet is: Regular Patient reports that she does have regular access to food.   Depression Screen PHQ 2/9 Scores 08/29/2019 08/17/2019 05/18/2019  PHQ - 2 Score 0 2 0  PHQ- 9 Score - 5 1     Fall Risk Fall Risk  08/29/2019 08/17/2019 05/18/2019  Falls in the  past year? 1 1 0  Number falls in past yr: 0 0 -  Injury with Fall? 1 1 -  Comment foot injury - -  Risk for fall due to : History of fall(s) History of fall(s) -  Follow up Falls prevention discussed Falls prevention discussed -     Objective:  Catherine Mayer seemed alert and oriented and she participated appropriately during our telephone visit.  Blood Pressure Weight BMI  BP Readings from Last 3 Encounters:  08/17/19 103/68  06/01/19 (!) 145/98  05/18/19 (!) 188/112   Wt Readings from Last 3 Encounters:  05/18/19 168 lb (76.2 kg)  07/02/18 160 lb (72.6 kg)  01/31/14 166 lb (75.3 kg)   BMI Readings from Last 1 Encounters:  08/17/19 32.81 kg/m    *Unable to obtain current vital signs, weight, and BMI due to telephone visit type  Hearing/Vision  Deyonna did not seem to have difficulty with hearing/understanding during the telephone conversation Reports that she has had a formal eye exam by an eye care professional within the past year Reports that she has not had a formal hearing evaluation within the past year *Unable to fully assess hearing and vision during telephone visit type  Cognitive Function: 6CIT Screen 08/29/2019  What Year? 0 points  What month? 0 points  What time? 0 points  Count back from 20  0 points  Months in reverse 0 points  Repeat phrase 0 points  Total Score 0   (Normal:0-7, Significant for Dysfunction: >8)  Normal Cognitive Function Screening: YES  Immunization & Health Maintenance Record Immunization History  Administered Date(s) Administered   Influenza, High Dose Seasonal PF 07/22/2016, 03/18/2017, 03/22/2019   Influenza,inj,quad, With Preservative 04/06/2017   Pneumococcal Conjugate-13 07/22/2016   Pneumococcal Polysaccharide-23 05/18/2019   Tdap 02/13/2015    Health Maintenance  Topic Date Due   MAMMOGRAM  01/18/2001   COLONOSCOPY  01/18/2001   DEXA SCAN  01/19/2016   TETANUS/TDAP  02/12/2025   INFLUENZA VACCINE  Completed   Hepatitis C Screening  Completed   PNA vac Low Risk Adult  Completed       Assessment  This is a routine wellness examination for Catherine Mayer.  Health Maintenance: Due or Overdue Health Maintenance Due  Topic Date Due   MAMMOGRAM  01/18/2001   COLONOSCOPY  01/18/2001   DEXA SCAN  01/19/2016    Catherine Mayer does not need a referral for Community Assistance: Care Management:   no Social Work:    no Prescription Assistance:  no Nutrition/Diabetes Education:  no   Plan:  Personalized Goals Goals Addressed             This Visit's Progress    Patient Stated       08/29/2019 AWV Goal: Fall Prevention  Over the next year, patient will decrease their risk for falls by: Using assistive devices, such as a cane or walker, as needed Identifying fall risks within their home and correcting them by: Removing throw rugs Adding handrails to stairs or ramps Removing clutter and keeping a clear pathway throughout the home Increasing light, especially at night Adding shower handles/bars Raising toilet seat Identifying potential personal risk factors for falls: Medication side effects Incontinence/urgency Vestibular dysfunction Hearing loss Musculoskeletal disorders Neurological disorders Orthostatic hypotension   08/29/2019 AWV Goal: Exercise for General Health  Patient will verbalize understanding of the benefits of increased physical activity: Exercising regularly is important. It will improve your overall fitness, flexibility, and endurance. Regular exercise also will improve  your overall health. It can help you control your weight, reduce stress, and improve your bone density. Over the next year, patient will increase physical activity as tolerated with a goal of at least 150 minutes of moderate physical activity per week.  You can tell that you are exercising at a moderate intensity if your heart starts beating faster and you start breathing faster but can still hold a conversation. Moderate-intensity exercise ideas include: Walking 1 mile (1.6 km) in about 15 minutes Biking Hiking Golfing Dancing Water aerobics Patient will verbalize understanding of everyday activities that increase physical activity by providing examples like the following: Yard work, such as: Insurance underwriter Gardening Washing windows or floors Patient will be able to explain general safety guidelines for exercising:  Before you start a new exercise program, talk with your health care provider. Do not exercise so much that you hurt yourself, feel dizzy, or get very short of breath. Wear comfortable clothes and wear shoes with good support. Drink plenty of water while you exercise to prevent dehydration or heat stroke. Work out until your breathing and your heartbeat get faster.        Personalized Health Maintenance & Screening Recommendations  UP TO DATE  Lung Cancer Screening Recommended: no (Low Dose CT Chest recommended if Age 68-80 years, 30 pack-year currently smoking OR have quit w/in past 15 years) Hepatitis C Screening recommended: no HIV Screening recommended: no  Advanced Directives: Written information was not  prepared per patient's request.  Referrals & Orders No orders of the defined types were placed in this encounter.   Follow-up Plan Follow-up with [name], DO as planned     I have personally reviewed and noted the following in the patient's chart:   Medical and social history Use of alcohol, tobacco or illicit drugs  Current medications and supplements Functional ability and status Nutritional status Physical activity Advanced directives List of other physicians Hospitalizations, surgeries, and ER visits in previous 12 months Vitals Screenings to include cognitive, depression, and falls Referrals and appointments  In addition, I have reviewed and discussed with Catherine Mayer certain preventive protocols, quality metrics, and best practice recommendations. A written personalized care plan for preventive services as well as general preventive health recommendations is available and can be mailed to the patient at her request.      Niklas Chretien, JAMIE B  08/29/2019   I have reviewed and agree with the above AWV documentation.   Jannifer Rodney, FNP

## 2019-09-26 DIAGNOSIS — M7661 Achilles tendinitis, right leg: Secondary | ICD-10-CM | POA: Diagnosis not present

## 2019-09-26 DIAGNOSIS — M79671 Pain in right foot: Secondary | ICD-10-CM | POA: Diagnosis not present

## 2019-10-03 ENCOUNTER — Telehealth: Payer: Self-pay | Admitting: Family Medicine

## 2019-10-04 ENCOUNTER — Other Ambulatory Visit: Payer: Self-pay

## 2019-10-04 ENCOUNTER — Ambulatory Visit (INDEPENDENT_AMBULATORY_CARE_PROVIDER_SITE_OTHER): Payer: Medicare Other | Admitting: Family Medicine

## 2019-10-04 ENCOUNTER — Encounter: Payer: Self-pay | Admitting: Family Medicine

## 2019-10-04 VITALS — BP 133/88 | HR 87 | Temp 97.4°F | Ht 60.0 in | Wt 168.0 lb

## 2019-10-04 DIAGNOSIS — R21 Rash and other nonspecific skin eruption: Secondary | ICD-10-CM

## 2019-10-04 MED ORDER — TRIAMCINOLONE ACETONIDE 0.1 % EX CREA: 1.0000 "application " | TOPICAL_CREAM | Freq: Two times a day (BID) | CUTANEOUS | 0 refills | Status: DC

## 2019-10-04 MED ORDER — LORATADINE 10 MG PO TABS: 10.0000 mg | ORAL_TABLET | Freq: Every day | ORAL | 11 refills | Status: DC

## 2019-10-04 NOTE — Patient Instructions (Signed)
This appears to be a contact dermatitis.  It does not appear consistent with skin infection or blood clot.  I am sending in a steroid cream to apply to affected areas twice daily and a Claritin to take daily as well.  If not better by Tues next week or if you develop any concerning signs (leg swelling, painful calf, fevers, etc) I want you to get seen.   Atopic Dermatitis Atopic dermatitis is a skin disorder that causes inflammation of the skin. This is the most common type of eczema. Eczema is a group of skin conditions that cause the skin to be itchy, red, and swollen. This condition is generally worse during the cooler winter months and often improves during the warm summer months. Symptoms can vary from person to person. Atopic dermatitis usually starts showing signs in infancy and can last through adulthood. This condition cannot be passed from one person to another (non-contagious), but it is more common in families. Atopic dermatitis may not always be present. When it is present, it is called a flare-up. What are the causes? The exact cause of this condition is not known. Flare-ups of the condition may be triggered by:  Contact with something that you are sensitive or allergic to.  Stress.  Certain foods.  Extremely hot or cold weather.  Harsh chemicals and soaps.  Dry air.  Chlorine. What increases the risk? This condition is more likely to develop in people who have a personal history or family history of eczema, allergies, asthma, or hay fever. What are the signs or symptoms? Symptoms of this condition include:  Dry, scaly skin.  Red, itchy rash.  Itchiness, which can be severe. This may occur before the skin rash. This can make sleeping difficult.  Skin thickening and cracking that can occur over time. How is this diagnosed? This condition is diagnosed based on your symptoms, a medical history, and a physical exam. How is this treated? There is no cure for this  condition, but symptoms can usually be controlled. Treatment focuses on:  Controlling the itchiness and scratching. You may be given medicines, such as antihistamines or steroid creams.  Limiting exposure to things that you are sensitive or allergic to (allergens).  Recognizing situations that cause stress and developing a plan to manage stress. If your atopic dermatitis does not get better with medicines, or if it is all over your body (widespread), a treatment using a specific type of light (phototherapy) may be used. Follow these instructions at home: Skin care   Keep your skin well-moisturized. Doing this seals in moisture and helps to prevent dryness. ? Use unscented lotions that have petroleum in them. ? Avoid lotions that contain alcohol or water. They can dry the skin.  Keep baths or showers short (less than 5 minutes) in warm water. Do not use hot water. ? Use mild, unscented cleansers for bathing. Avoid soap and bubble bath. ? Apply a moisturizer to your skin right after a bath or shower.  Do not apply anything to your skin without checking with your health care provider. General instructions  Dress in clothes made of cotton or cotton blends. Dress lightly because heat increases itchiness.  When washing your clothes, rinse your clothes twice so all of the soap is removed.  Avoid any triggers that can cause a flare-up.  Try to manage your stress.  Keep your fingernails cut short.  Avoid scratching. Scratching makes the rash and itchiness worse. It may also result in a skin infection (  impetigo) due to a break in the skin caused by scratching.  Take or apply over-the-counter and prescription medicines only as told by your health care provider.  Keep all follow-up visits as told by your health care provider. This is important.  Do not be around people who have cold sores or fever blisters. If you get the infection, it may cause your atopic dermatitis to worsen. Contact a  health care provider if:  Your itchiness interferes with sleep.  Your rash gets worse or it is not better within one week of starting treatment.  You have a fever.  You have a rash flare-up after having contact with someone who has cold sores or fever blisters. Get help right away if:  You develop pus or soft yellow scabs in the rash area. Summary  This condition causes a red rash and itchy, dry, scaly skin.  Treatment focuses on controlling the itchiness and scratching, limiting exposure to things that you are sensitive or allergic to (allergens), recognizing situations that cause stress, and developing a plan to manage stress.  Keep your skin well-moisturized.  Keep baths or showers shorter than 5 minutes and use warm water. Do not use hot water. This information is not intended to replace advice given to you by your health care provider. Make sure you discuss any questions you have with your health care provider. Document Revised: 10/12/2018 Document Reviewed: 07/25/2016 Elsevier Patient Education  2020 [employer].

## 2019-10-04 NOTE — Progress Notes (Signed)
Subjective: CC: Red leg PCP: Janora Norlander, DO HPI:Catherine Mayer is a 69 y.o. female presenting to clinic today for:  1.  Red leg Patient reports couple day history of redness along the anterior right leg.  She has a slight similar appearance along the left medial knee.  Reports some tingling but no overt pain.  She is ambulating normally (recently treated for a injury in the right lower extremity).  She is now out of the boot.  She did wear blue jeans yesterday which she thinks may have aggravated things.  No treatments thus far.  Denies any swelling of the leg.   ROS: Per HPI  No Known Allergies Past Medical History:  Diagnosis Date  . Anxiety   . Hyperlipidemia   . Hypertension   . Thyroid disease     Current Outpatient Medications:  .  amLODipine (NORVASC) 5 MG tablet, Take 1 tablet (5 mg total) by mouth daily., Disp: 90 tablet, Rfl: 3 .  escitalopram (LEXAPRO) 20 MG tablet, Take 20 mg by mouth daily. , Disp: , Rfl:  .  ibuprofen (ADVIL) 600 MG tablet, Take 600 mg by mouth 3 (three) times daily., Disp: , Rfl:  .  levothyroxine (SYNTHROID) 137 MCG tablet, Take 1 tablet (137 mcg total) by mouth daily before breakfast. Recheck levels in 2-3 months, Disp: 90 tablet, Rfl: 0 .  LORazepam (ATIVAN) 0.5 MG tablet, Take 1 tablet (0.5 mg total) by mouth daily as needed for anxiety., Disp: 90 tablet, Rfl: 1 .  losartan-hydrochlorothiazide (HYZAAR) 100-25 MG per tablet, Take 1 tablet by mouth daily. , Disp: , Rfl:  .  meloxicam (MOBIC) 15 MG tablet, Take 15 mg by mouth daily as needed for pain., Disp: , Rfl:  .  omeprazole (PRILOSEC) 20 MG capsule, Take 20 mg by mouth daily. , Disp: , Rfl:  .  pravastatin (PRAVACHOL) 80 MG tablet, Take 80 mg by mouth daily. , Disp: , Rfl:  Social History   Socioeconomic History  . Marital status: Married    Spouse name: Elenore Rota  . Number of children: 1  . Years of education: Not on file  . Highest education level: Associate degree: occupational,  Hotel manager, or vocational program  Occupational History  . Occupation: Economist    Comment: part time  Tobacco Use  . Smoking status: Never Smoker  . Smokeless tobacco: Never Used  Substance and Sexual Activity  . Alcohol use: Not Currently  . Drug use: Never  . Sexual activity: Not on file  Other Topics Concern  . Not on file  Social History Narrative   Enjoys playing with dogs and being outside.    Social Determinants of Health   Financial Resource Strain:   . Difficulty of Paying Living Expenses:   Food Insecurity:   . Worried About Charity fundraiser in the Last Year:   . Arboriculturist in the Last Year:   Transportation Needs:   . Film/video editor (Medical):   Marland Kitchen Lack of Transportation (Non-Medical):   Physical Activity:   . Days of Exercise per Week:   . Minutes of Exercise per Session:   Stress:   . Feeling of Stress :   Social Connections:   . Frequency of Communication with Friends and Family:   . Frequency of Social Gatherings with Friends and Family:   . Attends Religious Services:   . Active Member of Clubs or Organizations:   . Attends Archivist Meetings:   .  Marital Status:   Intimate Partner Violence:   . Fear of Current or Ex-Partner:   . Emotionally Abused:   Marland Kitchen Physically Abused:   . Sexually Abused:    Family History  Problem Relation Age of Onset  . Hypertension Mother   . Hyperlipidemia Mother   . Hypertension Father   . Hypertension Sister   . Hypertension Son   . Hyperlipidemia Son   . Hypertension Sister   . Hypertension Sister     Objective: Office vital signs reviewed. BP 133/88   Pulse 87   Temp (!) 97.4 F (36.3 C) (Temporal)   Ht 5' (1.524 m)   Wt 168 lb (76.2 kg)   SpO2 97%   BMI 32.81 kg/m   Physical Examination:  General: Awake, alert, well nourished, No acute distress Extremities: warm, well perfused, No edema, cyanosis or clubbing; +2 pulses bilaterally; negative Homans.  No palpable cords.   No tenderness palpation to the calf.  Calves are equal in size. Skin: Blanching, erythematous, maculopapular rash noted along the anterior right lower leg extending up to the knee.  Left medial knee with similar.  No skin breakdown.  No induration.  Assessment/ Plan: 69 y.o. female   1. Rash and nonspecific skin eruption Uncertain etiology.  Nothing on exam to suggest blood clot or infection at this time.  Trial of triamcinolone cream and Claritin.  We discussed red flag signs and symptoms warranting further evaluation.  If no resolution by this time next week, she is to follow-up. - triamcinolone cream (KENALOG) 0.1 %; Apply 1 application topically 2 (two) times daily. x7-10d  Dispense: 30 g; Refill: 0 - loratadine (CLARITIN) 10 MG tablet; Take 1 tablet (10 mg total) by mouth daily.  Dispense: 30 tablet; Refill: 11   No orders of the defined types were placed in this encounter.  No orders of the defined types were placed in this encounter.    [name], DO [facility] [phone]

## 2019-10-19 ENCOUNTER — Other Ambulatory Visit: Payer: Self-pay | Admitting: Family Medicine

## 2019-10-26 ENCOUNTER — Telehealth: Payer: Self-pay | Admitting: Family Medicine

## 2019-10-26 NOTE — Telephone Encounter (Signed)
Pts husband called to let us know when patient had her COVID Vaccines and last Eye Exam.  08/31/2019- 1st COVID Vaccine 09/28/2019- 2nd COVID Vaccine  09/27/2019- Last Eye Exam

## 2019-10-26 NOTE — Telephone Encounter (Signed)
For nurse review.

## 2019-10-27 NOTE — Telephone Encounter (Signed)
Dates recorded

## 2019-11-21 ENCOUNTER — Ambulatory Visit (INDEPENDENT_AMBULATORY_CARE_PROVIDER_SITE_OTHER): Payer: Medicare Other | Admitting: Family Medicine

## 2019-11-21 ENCOUNTER — Encounter: Payer: Self-pay | Admitting: Family Medicine

## 2019-11-21 ENCOUNTER — Other Ambulatory Visit: Payer: Self-pay

## 2019-11-21 ENCOUNTER — Ambulatory Visit: Payer: Medicare Other

## 2019-11-21 VITALS — BP 130/88 | HR 83 | Temp 97.5°F | Ht 60.0 in | Wt 164.0 lb

## 2019-11-21 DIAGNOSIS — E034 Atrophy of thyroid (acquired): Secondary | ICD-10-CM | POA: Diagnosis not present

## 2019-11-21 DIAGNOSIS — F410 Panic disorder [episodic paroxysmal anxiety] without agoraphobia: Secondary | ICD-10-CM

## 2019-11-21 DIAGNOSIS — F411 Generalized anxiety disorder: Secondary | ICD-10-CM

## 2019-11-21 DIAGNOSIS — E876 Hypokalemia: Secondary | ICD-10-CM

## 2019-11-21 DIAGNOSIS — M7661 Achilles tendinitis, right leg: Secondary | ICD-10-CM | POA: Diagnosis not present

## 2019-11-21 DIAGNOSIS — I10 Essential (primary) hypertension: Secondary | ICD-10-CM

## 2019-11-21 DIAGNOSIS — M79671 Pain in right foot: Secondary | ICD-10-CM | POA: Diagnosis not present

## 2019-11-21 MED ORDER — LORAZEPAM 0.5 MG PO TABS: 0.5000 mg | ORAL_TABLET | Freq: Every day | ORAL | 1 refills | Status: DC | PRN

## 2019-11-21 NOTE — Progress Notes (Signed)
Subjective: CC: F/u thyroid disorder, HTN HPI: Catherine Mayer is a 69 y.o. female presenting to clinic today for:  1.  Hypothyroidism Several year history of hypothyroidism. No history of radiation or surgery to the neck.  No known family history of thyroid disorder.    TSH was not appropriate at last visit and therefore her Synthroid was increased to 137 mcg daily.  She has been feeling fine on this dose.  No change in voice, difficulty swallowing, tremor, heart palpitation, change in bowel habits.  2.  Hypertension Blood pressure was borderline hypotensive and she was instructed to reduce Norvasc to 5 mg daily with continued Hyzaar 100-25 mg daily.  No chest pain, shortness of breath.  3.  Stress at home Patient reports ongoing stress at home related to her husband's behavior.  Her husband was found to have some abnormalities within his lab work but he has not yet pursued further work-up of this.  He is convinced that he "has cancer" and that he will die from cancer.  She notes that he has been drinking more alcohol as a result and she subsequently is more stressed out about his behaviors.  She continues to take her Lexapro as prescribed and uses Ativan very sparingly.  No falls, mental status changes with her medication.  Past Medical History:  Diagnosis Date  . Anxiety   . Hyperlipidemia   . Hypertension   . Thyroid disease    Past Surgical History:  Procedure Laterality Date  . ABDOMINAL HYSTERECTOMY    . CARPAL TUNNEL RELEASE Bilateral 2013  . CHOLECYSTECTOMY     Social History   Socioeconomic History  . Marital status: Married    Spouse name: [name]  . Number of children: 1  . Years of education: Not on file  . Highest education level: Associate degree: occupational, Scientist, product/process development, or vocational program  Occupational History  . 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 Smoking status: Never Smoker  . Smokeless tobacco: Never Used  Substance and  Sexual Activity  . Alcohol use: Not Currently  . Drug use: Never  . Sexual activity: Not on file  Other Topics Concern  . Not on file  Social History Narrative   Enjoys playing with dogs and being outside.    Social Determinants of Health   Financial Resource Strain:   . Difficulty of Paying Living Expenses:   Food Insecurity:   . Worried About Programme researcher, broadcasting/film/video in the Last Year:   . Barista in the Last Year:   Transportation Needs:   . Freight forwarder (Medical):   Marland Kitchen Lack of Transportation (Non-Medical):   Physical Activity:   . Days of Exercise per Week:   . Minutes of Exercise per Session:   Stress:   . Feeling of Stress :   Social Connections:   . Frequency of Communication with Friends and Family:   . Frequency of Social Gatherings with Friends and Family:   . Attends Religious Services:   . Active Member of Clubs or Organizations:   . Attends Banker Meetings:   Marland Kitchen Marital Status:   Intimate Partner Violence:   . Fear of Current or Ex-Partner:   . Emotionally Abused:   Marland Kitchen Physically Abused:   . Sexually Abused:    No outpatient medications have been marked as taking for the 11/21/19 encounter (Appointment) with [name], DO.   Family History  Problem Relation Age  of Onset  . Hypertension Mother   . Hyperlipidemia Mother   . Hypertension Father   . Hypertension Sister   . Hypertension Son   . Hyperlipidemia Son   . Hypertension Sister   . Hypertension Sister    No Known Allergies   Health Maintenance: Mammo/ Pap w/ Dr Willis Modena.  Will schedule.  Completed ROI for colonoscopy results. ROS: Per HPI  Objective: Office vital signs reviewed. BP 130/88   Pulse 83   Temp (!) 97.5 F (36.4 C) (Temporal)   Ht 5' (1.524 m)   Wt 164 lb (74.4 kg)   SpO2 97%   BMI 32.03 kg/m   Physical Examination:  General: Awake, alert, well nourished, No acute distress HEENT: Normal; no goiter.  No exophthalmos Cardio: regular rate  and rhythm, S1S2 heard, no murmurs appreciated Pulm: clear to auscultation bilaterally, no wheezes, rhonchi or rales; normal work of breathing on room air Extremities: warm, well perfused, No edema, cyanosis or clubbing; +2 radial pulses bilaterally Skin: Normal temperature Neuro: No tremor.   Psych: Mood stable, speech normal, affect appropriate, pleasant and interactive Depression screen [facility] 2/9 11/21/2019 10/04/2019 08/29/2019  Decreased Interest 0 0 0  Down, Depressed, Hopeless 0 0 0  PHQ - 2 Score 0 0 0  Altered sleeping 0 0 -  Tired, decreased energy 0 0 -  Change in appetite 0 0 -  Feeling bad or failure about yourself  0 0 -  Trouble concentrating 0 0 -  Moving slowly or fidgety/restless 0 0 -  Suicidal thoughts 0 0 -  PHQ-9 Score 0 0 -  Difficult doing work/chores Not difficult at all - -   GAD 7 : Generalized Anxiety Score 11/21/2019 08/17/2019 05/18/2019  Nervous, Anxious, on Edge 1 1 1   Control/stop worrying 2 1 1   Worry too much - different things 2 1 1   Trouble relaxing 1 0 0  Restless 1 0 1  Easily annoyed or irritable 1 0 0  Afraid - awful might happen 1 1 0  Total GAD 7 Score 9 4 4   Anxiety Difficulty - Not difficult at all Somewhat difficult    Assessment/ Plan: 69 y.o. female   1. Hypothyroidism due to acquired atrophy of thyroid Asymptomatic.  Check thyroid panel. - Thyroid Panel With TSH  2. Hypokalemia Check BMP and magnesium - Basic Metabolic Panel - Magnesium  3. Essential hypertension with goal blood pressure less than 130/80 Controlled.  Continue current regimen  4. Generalized anxiety disorder with panic attacks National narcotic database was reviewed and there were no red flags.  Ativan renewed.  Continue Lexapro. - LORazepam (ATIVAN) 0.5 MG tablet; Take 1 tablet (0.5 mg total) by mouth daily as needed for anxiety.  Dispense: 90 tablet; Refill: [place], [place] [phone]

## 2019-11-22 LAB — MAGNESIUM: Magnesium: 2 mg/dL (ref 1.6–2.3)

## 2019-11-22 LAB — BASIC METABOLIC PANEL
BUN/Creatinine Ratio: 16 (ref 12–28)
BUN: 14 mg/dL (ref 8–27)
CO2: 24 mmol/L (ref 20–29)
Calcium: 9.3 mg/dL (ref 8.7–10.3)
Chloride: 102 mmol/L (ref 96–106)
Creatinine, Ser: 0.86 mg/dL (ref 0.57–1.00)
GFR calc Af Amer: 80 mL/min/{1.73_m2} (ref >59)
GFR calc non Af Amer: 70 mL/min/{1.73_m2} (ref >59)
Glucose: 94 mg/dL (ref 65–99)
Potassium: 3.6 mmol/L (ref 3.5–5.2)
Sodium: 143 mmol/L (ref 134–144)

## 2019-11-22 LAB — THYROID PANEL WITH TSH
Free Thyroxine Index: 3.4 (ref 1.2–4.9)
T3 Uptake Ratio: 31 % (ref 24–39)
T4, Total: 10.9 ug/dL (ref 4.5–12.0)
TSH: 0.043 u[IU]/mL — ABNORMAL LOW (ref 0.450–4.500)

## 2019-11-23 ENCOUNTER — Telehealth: Payer: Self-pay | Admitting: Family Medicine

## 2019-11-23 NOTE — Telephone Encounter (Signed)
Pt returned missed call from [facility] regarding lab results. Reviewed results with pt per Dr Jeannett Senior notes. Pt voiced understanding. Also scheduled pt to come back in 2 mths to have blood work rechecked per Dr Reece Agar request.

## 2019-12-01 ENCOUNTER — Encounter: Payer: Self-pay | Admitting: Family Medicine

## 2020-01-17 ENCOUNTER — Other Ambulatory Visit: Payer: Self-pay

## 2020-01-17 ENCOUNTER — Encounter: Payer: Self-pay | Admitting: Family Medicine

## 2020-01-17 ENCOUNTER — Ambulatory Visit (INDEPENDENT_AMBULATORY_CARE_PROVIDER_SITE_OTHER): Payer: Medicare Other | Admitting: Family Medicine

## 2020-01-17 VITALS — BP 119/78 | HR 84 | Temp 97.4°F | Ht >= 80 in | Wt 164.0 lb

## 2020-01-17 DIAGNOSIS — E034 Atrophy of thyroid (acquired): Secondary | ICD-10-CM

## 2020-01-17 DIAGNOSIS — R5383 Other fatigue: Secondary | ICD-10-CM

## 2020-01-17 DIAGNOSIS — R6889 Other general symptoms and signs: Secondary | ICD-10-CM | POA: Diagnosis not present

## 2020-01-17 DIAGNOSIS — W57XXXA Bitten or stung by nonvenomous insect and other nonvenomous arthropods, initial encounter: Secondary | ICD-10-CM | POA: Diagnosis not present

## 2020-01-17 NOTE — Progress Notes (Signed)
Subjective: CC: F/u thyroid disorder HPI: Catherine Mayer is a 69 y.o. female presenting to clinic today for:  1.  Hypothyroidism Several year history of hypothyroidism. No history of radiation or surgery to the neck.  No known family history of thyroid disorder.    TSH was not appropriate at last visit and therefore her Synthroid was adjusted to 2 days per week and 137 mcg daily all other days.  Overall she is doing okay.  She does report a little bit of fatigue and was unsure if this is related to recent tick bite.  Denies any change in bowel habits, weight, temperature intolerance but does report occasional shivers.  No rashes, headache, nausea, vomiting, joint swelling or increased arthralgias or myalgias.   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    Social Determinants of Health   Financial Resource Strain:    Difficulty of Paying Living Expenses:   Food Insecurity:    Worried About Programme researcher, broadcasting/film/video in the Last Year:    Barista in the Last Year:   Transportation Needs:    Automotive engineer (Medical):    Lack of Transportation (Non-Medical):   Physical Activity:    Days of Exercise per Week:    Minutes of Exercise per Session:   Stress:    Feeling of Stress :   Social Connections:    Frequency of Communication with Friends and Family:    Frequency of Social Gatherings with Friends and Family:    Attends Religious Services:    Active Member of Clubs or Organizations:    Attends Banker Meetings:    Marital Status:   Intimate Partner Violence:    Fear of Current or Ex-Partner:    Emotionally Abused:    Physically Abused:    Sexually Abused:    No outpatient medications have been marked as taking for the 01/17/20 encounter (Office Visit) with [name], DO.   Family History  Problem Relation Age of Onset   Hypertension Mother    Hyperlipidemia Mother    Hypertension Father    Hypertension Sister    Hypertension Son    Hyperlipidemia Son    Hypertension Sister    Hypertension Sister    No Known Allergies   Health Maintenance: Mammo/ Pap w/ Dr Associate Professor.  Will schedule.  Completed ROI for colonoscopy results. ROS: Per HPI  Objective: Office vital signs reviewed. BP 119/78    Pulse 84  Temp (!) 97.4 F (36.3 C) (Temporal)    Ht 7' (2.134 m)    Wt 164 lb (74.4 kg)    BMI 16.34 kg/m   Physical Examination:  General: Awake, alert, well nourished, well appearing female. No acute distress HEENT: Normal; no goiter.  No exophthalmos Cardio: regular rate and rhythm, S1S2 heard, no murmurs appreciated Pulm: clear to auscultation bilaterally, no wheezes, rhonchi or rales; normal work of breathing on room air Extremities: warm, well perfused, No edema, cyanosis or clubbing; +2 radial pulses bilaterally Skin: Normal temperature, dry, warm Neuro: No tremor.   Psych: Mood stable, speech normal, affect appropriate, pleasant and interactive Depression screen [facility] 2/9 11/21/2019 10/04/2019 08/29/2019  Decreased  Interest 0 0 0  Down, Depressed, Hopeless 0 0 0  PHQ - 2 Score 0 0 0  Altered sleeping 0 0 -  Tired, decreased energy 0 0 -  Change in appetite 0 0 -  Feeling bad or failure about yourself  0 0 -  Trouble concentrating 0 0 -  Moving slowly or fidgety/restless 0 0 -  Suicidal thoughts 0 0 -  PHQ-9 Score 0 0 -  Difficult doing work/chores Not difficult at all - -   GAD 7 : Generalized Anxiety Score 11/21/2019 08/17/2019 05/18/2019  Nervous, Anxious, on Edge 1 1 1   Control/stop worrying 2 1 1   Worry too much - different things 2 1 1   Trouble relaxing 1 0 0  Restless 1 0 1  Easily annoyed or irritable 1 0 0  Afraid - awful might happen 1 1 0  Total GAD 7 Score 9 4 4   Anxiety Difficulty - Not difficult at all Somewhat difficult    Assessment/ Plan: 69 y.o. female   1. Hypothyroidism due to acquired atrophy of thyroid Having some fatigue, difficult to tell if this is from metabolic abnormality versus infectious etiology.  Will perform labs as below - Thyroid Panel With TSH  2. Fatigue, unspecified type - Lyme Ab/Western Blot Reflex - CBC - Vitamin B12  3. Tick bite, initial encounter - Lyme Ab/Western Blot Reflex   , DO [facility] [phone]

## 2020-01-17 NOTE — Patient Instructions (Signed)

## 2020-01-19 LAB — CBC
Hematocrit: 39.8 % (ref 34.0–46.6)
Hemoglobin: 13.8 g/dL (ref 11.1–15.9)
MCH: 33.2 pg — ABNORMAL HIGH (ref 26.6–33.0)
MCHC: 34.7 g/dL (ref 31.5–35.7)
MCV: 96 fL (ref 79–97)
Platelets: 255 10*3/uL (ref 150–450)
RBC: 4.16 x10E6/uL (ref 3.77–5.28)
RDW: 14 % (ref 11.7–15.4)
WBC: 5.3 10*3/uL (ref 3.4–10.8)

## 2020-01-19 LAB — THYROID PANEL WITH TSH
Free Thyroxine Index: 2.6 (ref 1.2–4.9)
T3 Uptake Ratio: 28 % (ref 24–39)
T4, Total: 9.3 ug/dL (ref 4.5–12.0)
TSH: 1.06 u[IU]/mL (ref 0.450–4.500)

## 2020-01-19 LAB — LYME AB/WESTERN BLOT REFLEX
LYME DISEASE AB, QUANT, IGM: <0.8 index (ref 0.00–0.79)
Lyme IgG/IgM Ab: <0.91 {ISR} (ref 0.00–0.90)

## 2020-01-19 LAB — VITAMIN B12: Vitamin B-12: 335 pg/mL (ref 232–1245)

## 2020-01-31 ENCOUNTER — Other Ambulatory Visit: Payer: Self-pay | Admitting: Family Medicine

## 2020-01-31 DIAGNOSIS — I10 Essential (primary) hypertension: Secondary | ICD-10-CM

## 2020-02-02 ENCOUNTER — Telehealth: Payer: Self-pay | Admitting: *Deleted

## 2020-02-02 MED ORDER — LEVOTHYROXINE SODIUM 137 MCG PO TABS: ORAL_TABLET | ORAL | 3 refills | Status: DC

## 2020-02-02 NOTE — Telephone Encounter (Signed)
Filled levothyroxine.

## 2020-02-22 ENCOUNTER — Other Ambulatory Visit: Payer: Self-pay

## 2020-02-22 MED ORDER — ESCITALOPRAM OXALATE 20 MG PO TABS: 20.0000 mg | ORAL_TABLET | Freq: Every day | ORAL | 3 refills | Status: DC

## 2020-02-22 MED ORDER — PRAVASTATIN SODIUM 80 MG PO TABS: 80.0000 mg | ORAL_TABLET | Freq: Every day | ORAL | 3 refills | Status: DC

## 2020-02-22 NOTE — Telephone Encounter (Signed)
Received fax from [facility] for a refill of pravastatin (No mg), lexapro (No mg), Losaratan (No mg), and HCTZ (no mg). Looks like these haven't been filled since 2015 so was unsure about refill and strength. Please advise

## 2020-03-09 ENCOUNTER — Other Ambulatory Visit: Payer: Self-pay

## 2020-03-09 DIAGNOSIS — R21 Rash and other nonspecific skin eruption: Secondary | ICD-10-CM

## 2020-03-09 MED ORDER — PRAVASTATIN SODIUM 80 MG PO TABS: 80.0000 mg | ORAL_TABLET | Freq: Every day | ORAL | 0 refills | Status: DC

## 2020-03-09 MED ORDER — TRIAMCINOLONE ACETONIDE 0.1 % EX CREA: 1.0000 "application " | TOPICAL_CREAM | Freq: Two times a day (BID) | CUTANEOUS | 0 refills | Status: DC

## 2020-03-26 ENCOUNTER — Other Ambulatory Visit: Payer: Medicare Other

## 2020-03-27 ENCOUNTER — Other Ambulatory Visit: Payer: Self-pay

## 2020-03-27 ENCOUNTER — Ambulatory Visit: Payer: Medicare Other | Admitting: Family Medicine

## 2020-03-27 ENCOUNTER — Ambulatory Visit (INDEPENDENT_AMBULATORY_CARE_PROVIDER_SITE_OTHER): Payer: Medicare Other

## 2020-03-27 DIAGNOSIS — Z780 Asymptomatic menopausal state: Secondary | ICD-10-CM

## 2020-03-27 LAB — IFOBT (OCCULT BLOOD): IFOBT: NEGATIVE

## 2020-05-09 ENCOUNTER — Telehealth: Payer: Self-pay

## 2020-05-09 NOTE — Telephone Encounter (Signed)
Patient called told to treat symptoms take her at home covid test and if positive to call back. If she does not feel better with in 2-3 days to call us.

## 2020-05-10 ENCOUNTER — Ambulatory Visit (INDEPENDENT_AMBULATORY_CARE_PROVIDER_SITE_OTHER): Payer: Medicare Other | Admitting: Nurse Practitioner

## 2020-05-10 ENCOUNTER — Other Ambulatory Visit: Payer: Self-pay | Admitting: Family Medicine

## 2020-05-10 DIAGNOSIS — R059 Cough, unspecified: Secondary | ICD-10-CM | POA: Diagnosis not present

## 2020-05-10 MED ORDER — AZITHROMYCIN 250 MG PO TABS: ORAL_TABLET | ORAL | 0 refills | Status: DC

## 2020-05-10 MED ORDER — BENZONATATE 100 MG PO CAPS: 100.0000 mg | ORAL_CAPSULE | Freq: Three times a day (TID) | ORAL | 0 refills | Status: DC | PRN

## 2020-05-10 NOTE — Progress Notes (Signed)
Virtual Visit via telephone Note Due to COVID-19 pandemic this visit was conducted virtually. This visit type was conducted due to national recommendations for restrictions regarding the COVID-19 Pandemic (e.g. social distancing, sheltering in place) in an effort to limit this patient's exposure and mitigate transmission in our community. All issues noted in this document were discussed and addressed.  A physical exam was not performed with this format.  I connected with Catherine Mayer on 05/10/20 at 12:40 by telephone and verified that I am speaking with the correct person using two identifiers. Catherine Mayer is currently located at home and noone is currently with her during visit. The provider, Mary-Margaret Daphine Deutscher, FNP is located in their office at time of visit.  I discussed the limitations, risks, security and privacy concerns of performing an evaluation and management service by telephone and the availability of in person appointments. I also discussed with the patient that there may be a patient responsible charge related to this service. The patient expressed understanding and agreed to proceed.   History and Present Illness:   Chief Complaint: Cough   HPI Patient calls in for appointment she has developed a cough , with lots of phlegm in her throat. She has some nasal congestion. Started 7 day sago. She denies a known exposure to covid. Has had both covid vaccines. She has been using alkaseltzer nighttime and has not helped.   Review of Systems  Constitutional: Negative for chills and fever.  HENT: Positive for congestion and sore throat (slight from drainage. no pain with swallowing.). Negative for sinus pain.   Respiratory: Positive for cough (productive) and sputum production. Negative for shortness of breath.   Musculoskeletal: Negative for myalgias.  Neurological: Negative for dizziness and headaches.  Psychiatric/Behavioral: Negative.      Observations/Objective: Alert and  oriented- answers all questions appropriately No distress Wet sounding cough   Assessment and Plan: Catherine Mayer in today with chief complaint of Cough   1. Cough 1. Take meds as prescribed 2. Use a cool mist humidifier especially during the winter months and when heat has been humid. 3. Use saline nose sprays frequently 4. Saline irrigations of the nose can be very helpful if done frequently.  * 4X daily for 1 week*  * Use of a nettie pot can be helpful with this. Follow directions with this* 5. Drink plenty of fluids 6. Keep thermostat turn down low 7.For any cough or congestion  Tessalon perles or OTC cough meds 8. For fever or aces or pains- take tylenol or ibuprofen appropriate for age and weight.  * for fevers greater than 101 orally you may alternate ibuprofen and tylenol every  3 hours.    - benzonatate (TESSALON PERLES) 100 MG capsule; Take 1 capsule (100 mg total) by mouth 3 (three) times daily as needed for cough.  Dispense: 20 capsule; Refill: 0 - azithromycin (ZITHROMAX Z-PAK) 250 MG tablet; As directed  Dispense: 6 tablet; Refill: 0 - Novel Coronavirus, NAA ([facility]); Future     Follow Up Instructions: prn    I discussed the assessment and treatment plan with the patient. The patient was provided an opportunity to ask questions and all were answered. The patient agreed with the plan and demonstrated an understanding of the instructions.   The patient was advised to call back or seek an in-person evaluation if the symptoms worsen or if the condition fails to improve as anticipated.  The above assessment and management plan was discussed with the patient. The patient  verbalized understanding of and has agreed to the management plan. Patient is aware to call the clinic if symptoms persist or worsen. Patient is aware when to return to the clinic for a follow-up visit. Patient educated on when it is appropriate to go to the emergency department.   Time call ended:   12:56  I provided 16 minutes of non-face-to-face time during this encounter.    Mary-Margaret Daphine Deutscher, FNP

## 2020-05-10 NOTE — Addendum Note (Signed)
Addended by: Margorie John on: 05/10/2020 02:26 PM   Modules accepted: Orders

## 2020-05-11 LAB — NOVEL CORONAVIRUS, NAA: SARS-CoV-2, NAA: NOT DETECTED

## 2020-05-11 LAB — SARS-COV-2, NAA 2 DAY TAT

## 2020-05-28 ENCOUNTER — Other Ambulatory Visit: Payer: Self-pay | Admitting: *Deleted

## 2020-05-28 DIAGNOSIS — F410 Panic disorder [episodic paroxysmal anxiety] without agoraphobia: Secondary | ICD-10-CM

## 2020-05-28 MED ORDER — LOSARTAN POTASSIUM-HCTZ 100-25 MG PO TABS
1.0000 | ORAL_TABLET | Freq: Every day | ORAL | 1 refills | Status: DC
Start: 2020-05-28 — End: 2020-08-22

## 2020-06-10 ENCOUNTER — Emergency Department (HOSPITAL_COMMUNITY)
Admission: EM | Admit: 2020-06-10 | Discharge: 2020-06-10 | Disposition: A | Discharge status: Home / Self Care | Payer: Medicare Other | Attending: Emergency Medicine | Admitting: Emergency Medicine

## 2020-06-10 ENCOUNTER — Other Ambulatory Visit: Payer: Self-pay

## 2020-06-10 ENCOUNTER — Emergency Department (HOSPITAL_COMMUNITY): Payer: Medicare Other

## 2020-06-10 DIAGNOSIS — S99911A Unspecified injury of right ankle, initial encounter: Secondary | ICD-10-CM | POA: Diagnosis not present

## 2020-06-10 DIAGNOSIS — Z79899 Other long term (current) drug therapy: Secondary | ICD-10-CM | POA: Diagnosis not present

## 2020-06-10 DIAGNOSIS — E039 Hypothyroidism, unspecified: Secondary | ICD-10-CM | POA: Diagnosis not present

## 2020-06-10 DIAGNOSIS — S8251XA Displaced fracture of medial malleolus of right tibia, initial encounter for closed fracture: Secondary | ICD-10-CM | POA: Diagnosis not present

## 2020-06-10 DIAGNOSIS — W01198A Fall on same level from slipping, tripping and stumbling with subsequent striking against other object, initial encounter: Secondary | ICD-10-CM | POA: Insufficient documentation

## 2020-06-10 DIAGNOSIS — Y9301 Activity, walking, marching and hiking: Secondary | ICD-10-CM | POA: Diagnosis not present

## 2020-06-10 DIAGNOSIS — I10 Essential (primary) hypertension: Secondary | ICD-10-CM | POA: Diagnosis not present

## 2020-06-10 DIAGNOSIS — S82851A Displaced trimalleolar fracture of right lower leg, initial encounter for closed fracture: Secondary | ICD-10-CM | POA: Diagnosis not present

## 2020-06-10 DIAGNOSIS — M7989 Other specified soft tissue disorders: Secondary | ICD-10-CM | POA: Diagnosis not present

## 2020-06-10 MED ORDER — HYDROCODONE-ACETAMINOPHEN 5-325 MG PO TABS
1.0000 | ORAL_TABLET | Freq: Four times a day (QID) | ORAL | 0 refills | Status: DC | PRN
Start: 2020-06-10 — End: 2020-08-14

## 2020-06-10 MED ORDER — PROPOFOL 10 MG/ML IV BOLUS
0.5000 mg/kg | Freq: Once | INTRAVENOUS | Status: AC
  Administered 2020-06-10: 50 mg via INTRAVENOUS
  Filled 2020-06-10: qty 20

## 2020-06-10 NOTE — Discharge Instructions (Addendum)
Fracture care There is evidence of a fracture on the x-ray. Pain:  Ibuprofen, naproxen, or tylenol for pain. Vicodin: May take Vicodin (hydrocodone-acetaminophen) as needed for severe pain.   Do not drive or perform other dangerous activities while taking this medication as it can cause drowsiness as well as changes in reaction time and judgement.   Please note that each pill of Vicodin contains 325 mg of acetaminophen (generic for Tylenol) and the above dosage limits apply. Ice: May apply ice to the injured area for no more than 15 minutes at a time to reduce swelling and pain. Elevation: Keep the extremity elevated whenever possible to reduce swelling and pain. Splint: Keep the splint clean and dry.  Protect it from water during bathing.  If the splint gets wet, you will need to have it reapplied.  Do not leave a wet splint against the skin as this can cause skin breakdown.  Call the orthopedist office or come to the ED for splint replacement, if needed.  Follow-up: Follow-up with the orthopedic specialist for any further management of this issue.  Call the orthopedic office tomorrow morning to set up an appointment with Dr. Victorino Dike tomorrow. Return: Return to the emergency department for severely increased pain, numbness, blanching of the skin, or any other major concerns.

## 2020-06-10 NOTE — ED Provider Notes (Signed)
[facility]-EMERGENCY DEPT Provider Note   CSN: [id number] Arrival date & time: 06/10/20  1549     History Chief Complaint  Patient presents with  . Fall  . Ankle Pain    Catherine Mayer is a 69 y.o. female.  HPI      Catherine Mayer is a 69 y.o. female, with a history of anxiety, hyperlipidemia, HTN, presenting to the ED with right ankle injury that occurred around 3 PM this afternoon. Patient states she was walking outdoors, slipped on some leaves, and her right leg was stuck under her as she fell.  She complains of throbbing, moderate to severe, nonradiating pain all across the right ankle. She states she pulled herself to her vehicle and then drove using her left foot back home so her husband could take her to the emergency room.  Denies head injury, neck/back pain, LOC, chest pain, shortness of breath, abdominal pain, other extremity pain/injury, or any other complaints.  Past Medical History:  Diagnosis Date  . Anxiety   . Hyperlipidemia   . Hypertension   . Thyroid disease     Patient Active Problem List   Diagnosis Date Noted  . Generalized anxiety disorder with panic attacks 05/18/2019  . Panic disorder 09/13/2018  . Anxiety, generalized 09/13/2018  . Essential hypertension with goal blood pressure less than 130/80 07/22/2016  . Hyperlipidemia 07/22/2016  . Recurrent major depressive disorder, in full remission (HCC) 07/22/2016  . Vitamin D deficiency 07/22/2016  . Choking 12/29/2013  . Hypothyroidism 12/19/2013    Past Surgical History:  Procedure Laterality Date  . ABDOMINAL HYSTERECTOMY    . CARPAL TUNNEL RELEASE Bilateral 2013  . CHOLECYSTECTOMY       OB History   No obstetric history on file.     Family History  Problem Relation Age of Onset  . Hypertension Mother   . Hyperlipidemia Mother   . Hypertension Father   . Hypertension Sister   . Hypertension Son   . Hyperlipidemia Son   . Hypertension Sister   . Hypertension  Sister     Social History   Tobacco Use  . Smoking status: Never Smoker  . Smokeless tobacco: Never Used  Vaping Use  . Vaping Use: Never used  Substance Use Topics  . Alcohol use: Not Currently  . Drug use: Never    Home Medications Prior to Admission medications   Medication Sig Start Date End Date Taking? Authorizing Provider  amLODipine (NORVASC) 5 MG tablet TAKE 1 TABLET BY MOUTH  DAILY 02/01/20   Delynn Flavin M, DO  azithromycin (ZITHROMAX Z-PAK) 250 MG tablet As directed 05/10/20   Daphine Deutscher, Mary-Margaret, FNP  benzonatate (TESSALON PERLES) 100 MG capsule Take 1 capsule (100 mg total) by mouth 3 (three) times daily as needed for cough. 05/10/20   Daphine Deutscher, Mary-Margaret, FNP  diclofenac Sodium (VOLTAREN) 1 % GEL  10/20/19   [provider]  escitalopram (LEXAPRO) 20 MG tablet Take 1 tablet (20 mg total) by mouth daily. 02/22/20   [name], DO  HYDROcodone-acetaminophen (NORCO/VICODIN) 5-325 MG tablet Take 1 tablet by mouth every 6 (six) hours as needed for severe pain. 06/10/20   Irene Mitcham C, PA-C  ibuprofen (ADVIL) 600 MG tablet Take 600 mg by mouth 3 (three) times daily. 08/15/19   [provider]  levothyroxine (SYNTHROID) 137 MCG tablet TAKE 1 TABLET BY MOUTH  DAILY BEFORE BREAKFAST 02/02/20   Delynn Flavin M, DO  loratadine (CLARITIN) 10 MG tablet Take 1 tablet (  10 mg total) by mouth daily. 10/04/19   [name], DO  LORazepam (ATIVAN) 0.5 MG tablet Take 1 tablet (0.5 mg total) by mouth daily as needed for anxiety. 11/21/19   [name], DO  losartan-hydrochlorothiazide (HYZAAR) 100-25 MG tablet Take 1 tablet by mouth daily. 05/28/20   [name], DO  meloxicam (MOBIC) 15 MG tablet Take 15 mg by mouth daily as needed for pain. 03/09/18   [provider]  omeprazole (PRILOSEC) 20 MG capsule Take 20 mg by mouth daily.  11/29/13   [provider]  pravastatin (PRAVACHOL) 80 MG tablet TAKE 1 TABLET BY MOUTH   DAILY 05/10/20   Delynn Flavin M, DO  triamcinolone cream (KENALOG) 0.1 % Apply 1 application topically 2 (two) times daily. x7-10d 03/09/20   [name], DO    Allergies    Patient has no known allergies.  Review of Systems   Review of Systems  Constitutional: Negative for diaphoresis.  Respiratory: Negative for shortness of breath.   Cardiovascular: Negative for chest pain.  Gastrointestinal: Negative for abdominal pain, nausea and vomiting.  Musculoskeletal: Positive for arthralgias and joint swelling. Negative for back pain and neck pain.  Neurological: Negative for weakness and numbness.  All other systems reviewed and are negative.   Physical Exam Updated Vital Signs BP (!) 151/96 (BP Location: Left Arm)   Pulse 91   Temp 98.1 F (36.7 C) (Oral)   Resp 16   SpO2 98%   Physical Exam Vitals and nursing note reviewed.  Constitutional:      General: She is not in acute distress.    Appearance: She is well-developed. She is not diaphoretic.  HENT:     Head: Normocephalic and atraumatic.     Comments: No evidence of injury today head or face.    Mouth/Throat:     Mouth: Mucous membranes are moist.     Pharynx: Oropharynx is clear.  Eyes:     Conjunctiva/sclera: Conjunctivae normal.  Cardiovascular:     Rate and Rhythm: Normal rate and regular rhythm.     Pulses: Normal pulses.          Radial pulses are 2+ on the right side and 2+ on the left side.       Posterior tibial pulses are 2+ on the right side and 2+ on the left side.     Comments: Tactile temperature in the extremities appropriate and equal bilaterally. Pulmonary:     Effort: Pulmonary effort is normal. No respiratory distress.  Abdominal:     Palpations: Abdomen is soft.     Tenderness: There is no abdominal tenderness. There is no guarding.  Musculoskeletal:     Cervical back: Normal range of motion and neck supple.     Comments: Normal motor function intact in all extremities. No midline  spinal tenderness.   Pain, swelling, tenderness in the right medial, anterior, and lateral ankle. No tenderness, swelling, deformity, or instability to the right foot, rest of the right lower leg, right knee, right hip. Palpation and range of motion without pain or signs of injury to the rest of the extremities.  Skin:    General: Skin is warm and dry.     Capillary Refill: Capillary refill takes less than 2 seconds.  Neurological:     Mental Status: She is alert.     Comments: Sensation light touch grossly intact in the right foot and toes. Appropriate motor function intact in the right toes.  Psychiatric:        Mood and Affect: Mood and affect normal.        Speech: Speech normal.        Behavior: Behavior normal.     ED Results / Procedures / Treatments   Labs (all labs ordered are listed, but only abnormal results are displayed) Labs Reviewed - No data to display  EKG None  Radiology DG Ankle Complete Right  Result Date: 06/10/2020 CLINICAL DATA:  Post reduction EXAM: RIGHT ANKLE - COMPLETE 3+ VIEW COMPARISON:  X-ray earlier in the same day. FINDINGS: The patient has undergone reduction of the previously demonstrated ankle fractures. The alignment is somewhat improved. There is persistent surrounding soft tissue swelling. There is mild widening of the medial clear space. The plaster splint obscures bony details. IMPRESSION: Status post reduction of the previously demonstrated ankle fractures, as above. Electronically Signed   By: Katherine Mantlehristopher  Green M.D.   On: 06/10/2020 18:59   DG Ankle Complete Right  Result Date: 06/10/2020 CLINICAL DATA:  Tripped and fell today injuring the right ankle. EXAM: RIGHT ANKLE - COMPLETE 3+ VIEW COMPARISON:  None. FINDINGS: Oblique transverse fracture across the base of the medial malleolus. Oblique mildly comminuted fracture of the distal fibula extending from the metadiaphysis to the distal metaphysis crossing the ankle joint. There is a  probable, but not definitive, posterior malleolar fracture. Medial malleolar fracture is displaced medially by 8 mm. Talus is subluxed laterally to a similar degree. Distal fibular fracture is mildly displaced and angulated laterally. There is surrounding soft tissue swelling. IMPRESSION: 1. Displaced fractures of the medial malleolus and distal fibula as detailed with lateral talar subluxation of approximately 8 mm. There is a probable, but not definitive, posterior malleolar fracture. Electronically Signed   By: Amie Portlandavid  Ormond M.D.   On: 06/10/2020 17:11   DG Foot Complete Right  Result Date: 06/10/2020 CLINICAL DATA:  Tripped and fell today.  Injury to the right ankle. EXAM: RIGHT FOOT COMPLETE - 3+ VIEW COMPARISON:  None. FINDINGS: There are ankle fractures described under the ankle radiographs. No foot fracture. Advanced osteoarthritis at the first metatarsophalangeal joint. Remaining joints normally spaced and aligned. Small plantar and moderate dorsal calcaneal spurs. IMPRESSION: 1. Ankle fractures detailed under the ankle radiographs. 2. No right foot fracture or dislocation. Electronically Signed   By: Amie Portlandavid  Ormond M.D.   On: 06/10/2020 17:09    Procedures Reduction of fracture  Date/Time: 06/10/2020 6:12 PM Performed by: Anselm PancoastJoy, Twinkle Sockwell C, PA-C Authorized by: Anselm PancoastJoy, Delesia Martinek C, PA-C  Local anesthesia used: no  Anesthesia: Local anesthesia used: no  Sedation: Patient sedated: yes Sedation type: moderate (conscious) sedation Sedatives: propofol Vitals: Vital signs were monitored during sedation.  Patient tolerance: patient tolerated the procedure well with no immediate complications Comments: Circulation, motor function, sensation confirmed before and after procedure, as well as after splinting.    (including critical care time)  Medications Ordered in ED Medications  propofol (DIPRIVAN) 10 mg/mL bolus/IV push 37.2 mg (50 mg Intravenous Given 06/10/20 1819)    ED Course  I have  reviewed the triage vital signs and the nursing notes.  Pertinent labs & imaging results that were available during my care of the patient were reviewed by me and considered in my medical decision making (see chart for details).  Clinical Course as of Jun 10 2121  Wynelle LinkSun Jun 10, 2020  1727 Patient states her husband has been seen at [facility] in the past and she would like to  be seen at this practice as well.   Spoke with Jonny Ruiz with [facility] answering service.   [SJ]  1734 Spoke wth Dr. Charlann Boxer, orthopedic surgeon. We reviewed the patient's injury and x-ray findings. Stirrup and posterior splint, nonweightbearing. She can call the office in the morning and will see Dr. Victorino Dike tomorrow.   [SJ]  1830 Patient states she did not take her blood pressure medications today.  BP(!): 161/107 [SJ]    Clinical Course User Index [SJ] Aleigh Grunden, Hillard Danker, PA-C   MDM Rules/Calculators/A&P                          Patient presents with a right ankle injury.  No evidence of neurovascular compromise. I personally reviewed and interpreted the patient's x-rays.  Evidence of definite bimalleolar, possibly trimalleolar fracture. Patient underwent procedural sedation to allow for reduction of fracture and splinting. Postreduction x-ray improved. Patient demonstrated her ability to utilize crutches to avoid weightbearing. The patient was given instructions for home care as well as return precautions. Patient voices understanding of these instructions, accepts the plan, and is comfortable with discharge.  Findings and plan of care discussed with Adalberto Cole, MD. Dr. Jeraldine Loots personally evaluated and examined this patient.  Vitals:   06/10/20 1825 06/10/20 1826 06/10/20 1900 06/10/20 1908  BP: (!) 178/95 (!) 161/107 (!) 182/104 (!) 185/87  Pulse:  91 88 88  Resp:  20 15 18   Temp:      TempSrc:      SpO2:  100% 100% 100%  Weight:         Final Clinical Impression(s) / ED Diagnoses Final diagnoses:   Closed trimalleolar fracture of right ankle, initial encounter    Rx / DC Orders ED Discharge Orders         Ordered    HYDROcodone-acetaminophen (NORCO/VICODIN) 5-325 MG tablet  Every 6 hours PRN        06/10/20 1926           14/05/21 06/10/20 2122    2123, MD 06/10/20 2229

## 2020-06-10 NOTE — ED Provider Notes (Signed)
.  Sedation  Date/Time: 06/10/2020 10:30 PM Performed by: Gerhard Munch, MD Authorized by: Gerhard Munch, MD   Consent:    Consent obtained:  Verbal   Consent given by:  Patient   Risks discussed:  Inadequate sedation Universal protocol:    Procedure explained and questions answered to patient or proxy's satisfaction: yes     Relevant documents present and verified: yes     Test results available and properly labeled: yes     Imaging studies available: yes     Required blood products, implants, devices, and special equipment available: yes     Site/side marked: yes     Immediately prior to procedure a time out was called: yes     Patient identity confirmation method:  Verbally with patient Indications:    Procedure performed:  Fracture reduction   Procedure necessitating sedation performed by:  Physician performing sedation Pre-sedation assessment:    Time since last food or drink:  4   ASA classification: class 1 - normal, healthy patient     Neck mobility: normal     Mouth opening:  3 or more finger widths   Mallampati score:  I - soft palate, uvula, fauces, pillars visible   Pre-sedation assessments completed and reviewed: airway patency, cardiovascular function, hydration status, mental status, nausea/vomiting, pain level, respiratory function and temperature     Pre-sedation assessment completed:  06/10/2020 8:15 PM Immediate pre-procedure details:    Reassessment: Patient reassessed immediately prior to procedure     Reviewed: vital signs and NPO status     Verified: bag valve mask available, emergency equipment available, intubation equipment available, IV patency confirmed, oxygen available, reversal medications available and suction available   Procedure details (see MAR for exact dosages):    Preoxygenation:  Nasal cannula   Sedation:  Propofol   Intended level of sedation: deep   Analgesia: see MAR.   Intra-procedure monitoring:  Blood pressure monitoring, cardiac  monitor, continuous capnometry, continuous pulse oximetry, frequent vital sign checks and frequent LOC assessments   Intra-procedure events: none     Total Provider sedation time (minutes):  15 Post-procedure details:    Post-sedation assessment completed:  06/10/2020 9:00 PM   Attendance: Constant attendance by certified staff until patient recovered     Recovery: Patient returned to pre-procedure baseline     Post-sedation assessments completed and reviewed: airway patency, cardiovascular function, hydration status, mental status, nausea/vomiting, pain level, respiratory function and temperature     Patient is stable for discharge or admission: yes     Patient tolerance:  Tolerated well, no immediate complications      Gerhard Munch, MD 06/10/20 2232

## 2020-06-10 NOTE — ED Triage Notes (Signed)
Patient reports she tripped and fell today and hurt her right ankle. Patient reports she is no on any blood thinners, no LOC, did not hit her head.

## 2020-06-10 NOTE — ED Notes (Signed)
Pt provided crutches and walked on them with little assistance

## 2020-06-11 ENCOUNTER — Other Ambulatory Visit (HOSPITAL_COMMUNITY)
Admission: RE | Admit: 2020-06-11 | Discharge: 2020-06-11 | Disposition: A | Discharge status: Home / Self Care | Payer: Medicare Other | Source: Ambulatory Visit | Attending: Orthopedic Surgery | Admitting: Orthopedic Surgery

## 2020-06-11 ENCOUNTER — Other Ambulatory Visit (HOSPITAL_COMMUNITY): Payer: Self-pay | Admitting: Orthopedic Surgery

## 2020-06-11 ENCOUNTER — Encounter (HOSPITAL_BASED_OUTPATIENT_CLINIC_OR_DEPARTMENT_OTHER)
Admission: RE | Admit: 2020-06-11 | Discharge: 2020-06-11 | Disposition: A | Discharge status: Home / Self Care | Payer: Medicare Other | Source: Ambulatory Visit | Attending: Orthopedic Surgery | Admitting: Orthopedic Surgery

## 2020-06-11 ENCOUNTER — Encounter (HOSPITAL_BASED_OUTPATIENT_CLINIC_OR_DEPARTMENT_OTHER): Payer: Self-pay | Admitting: Orthopedic Surgery

## 2020-06-11 ENCOUNTER — Other Ambulatory Visit: Payer: Self-pay

## 2020-06-11 DIAGNOSIS — S82851A Displaced trimalleolar fracture of right lower leg, initial encounter for closed fracture: Secondary | ICD-10-CM | POA: Diagnosis not present

## 2020-06-11 DIAGNOSIS — Z20822 Contact with and (suspected) exposure to covid-19: Secondary | ICD-10-CM | POA: Insufficient documentation

## 2020-06-11 DIAGNOSIS — Z01812 Encounter for preprocedural laboratory examination: Secondary | ICD-10-CM | POA: Insufficient documentation

## 2020-06-11 LAB — BASIC METABOLIC PANEL
Anion gap: 9 (ref 5–15)
BUN: 14 mg/dL (ref 8–23)
CO2: 28 mmol/L (ref 22–32)
Calcium: 9.1 mg/dL (ref 8.9–10.3)
Chloride: 100 mmol/L (ref 98–111)
Creatinine, Ser: 0.84 mg/dL (ref 0.44–1.00)
GFR, Estimated: >60 mL/min (ref >60)
Glucose, Bld: 102 mg/dL — ABNORMAL HIGH (ref 70–99)
Potassium: 3.3 mmol/L — ABNORMAL LOW (ref 3.5–5.1)
Sodium: 137 mmol/L (ref 135–145)

## 2020-06-11 LAB — SARS CORONAVIRUS 2 (TAT 6-24 HRS): SARS Coronavirus 2: NEGATIVE

## 2020-06-11 NOTE — Progress Notes (Signed)

## 2020-06-13 ENCOUNTER — Ambulatory Visit: Payer: Medicare Other

## 2020-06-14 ENCOUNTER — Encounter (HOSPITAL_BASED_OUTPATIENT_CLINIC_OR_DEPARTMENT_OTHER): Payer: Self-pay | Admitting: Orthopedic Surgery

## 2020-06-14 ENCOUNTER — Ambulatory Visit (HOSPITAL_BASED_OUTPATIENT_CLINIC_OR_DEPARTMENT_OTHER): Payer: Medicare Other | Admitting: Anesthesiology

## 2020-06-14 ENCOUNTER — Ambulatory Visit (HOSPITAL_BASED_OUTPATIENT_CLINIC_OR_DEPARTMENT_OTHER)
Admission: RE | Admit: 2020-06-14 | Discharge: 2020-06-14 | Disposition: A | Discharge status: Home / Self Care | Payer: Medicare Other | Source: Ambulatory Visit | Attending: Orthopedic Surgery | Admitting: Orthopedic Surgery

## 2020-06-14 ENCOUNTER — Other Ambulatory Visit: Payer: Self-pay

## 2020-06-14 ENCOUNTER — Encounter (HOSPITAL_BASED_OUTPATIENT_CLINIC_OR_DEPARTMENT_OTHER)
Admission: RE | Disposition: A | Discharge status: Home / Self Care | Payer: Self-pay | Source: Ambulatory Visit | Attending: Orthopedic Surgery

## 2020-06-14 DIAGNOSIS — Y92096 Garden or yard of other non-institutional residence as the place of occurrence of the external cause: Secondary | ICD-10-CM | POA: Diagnosis not present

## 2020-06-14 DIAGNOSIS — Z791 Long term (current) use of non-steroidal anti-inflammatories (NSAID): Secondary | ICD-10-CM | POA: Insufficient documentation

## 2020-06-14 DIAGNOSIS — W19XXXA Unspecified fall, initial encounter: Secondary | ICD-10-CM | POA: Insufficient documentation

## 2020-06-14 DIAGNOSIS — G8918 Other acute postprocedural pain: Secondary | ICD-10-CM | POA: Diagnosis not present

## 2020-06-14 DIAGNOSIS — Z79890 Hormone replacement therapy (postmenopausal): Secondary | ICD-10-CM | POA: Diagnosis not present

## 2020-06-14 DIAGNOSIS — I10 Essential (primary) hypertension: Secondary | ICD-10-CM | POA: Diagnosis not present

## 2020-06-14 DIAGNOSIS — S82851A Displaced trimalleolar fracture of right lower leg, initial encounter for closed fracture: Secondary | ICD-10-CM | POA: Diagnosis not present

## 2020-06-14 DIAGNOSIS — Z79899 Other long term (current) drug therapy: Secondary | ICD-10-CM | POA: Diagnosis not present

## 2020-06-14 DIAGNOSIS — E785 Hyperlipidemia, unspecified: Secondary | ICD-10-CM | POA: Diagnosis not present

## 2020-06-14 HISTORY — DX: Unspecified osteoarthritis, unspecified site: M19.90

## 2020-06-14 HISTORY — PX: ORIF ANKLE FRACTURE: SHX5408

## 2020-06-14 HISTORY — DX: Gastro-esophageal reflux disease without esophagitis: K21.9

## 2020-06-14 HISTORY — DX: Depression, unspecified: F32.A

## 2020-06-14 HISTORY — DX: Hypothyroidism, unspecified: E03.9

## 2020-06-14 HISTORY — DX: Displaced trimalleolar fracture of right lower leg, initial encounter for closed fracture: S82.851A

## 2020-06-14 SURGERY — OPEN REDUCTION INTERNAL FIXATION (ORIF) ANKLE FRACTURE
Anesthesia: General | Site: Ankle | Laterality: Right

## 2020-06-14 MED ORDER — FENTANYL CITRATE (PF) 100 MCG/2ML IJ SOLN: 25.0000 ug | INTRAMUSCULAR | Status: DC | PRN

## 2020-06-14 MED ORDER — LIDOCAINE 2% (20 MG/ML) 5 ML SYRINGE
INTRAMUSCULAR | Status: AC
  Filled 2020-06-14: qty 5

## 2020-06-14 MED ORDER — OXYCODONE HCL 5 MG/5ML PO SOLN: 5.0000 mg | Freq: Once | ORAL | Status: DC | PRN

## 2020-06-14 MED ORDER — DEXAMETHASONE SODIUM PHOSPHATE 4 MG/ML IJ SOLN
INTRAMUSCULAR | Status: DC | PRN
  Administered 2020-06-14: 5 mg via INTRAVENOUS

## 2020-06-14 MED ORDER — VANCOMYCIN HCL 500 MG IV SOLR
INTRAVENOUS | Status: DC | PRN
  Administered 2020-06-14: 500 mg

## 2020-06-14 MED ORDER — EPHEDRINE SULFATE 50 MG/ML IJ SOLN
INTRAMUSCULAR | Status: DC | PRN
  Administered 2020-06-14: 15 mg via INTRAVENOUS

## 2020-06-14 MED ORDER — CEFAZOLIN SODIUM-DEXTROSE 2-4 GM/100ML-% IV SOLN
2.0000 g | INTRAVENOUS | Status: AC
  Administered 2020-06-14: 2 g via INTRAVENOUS

## 2020-06-14 MED ORDER — MIDAZOLAM HCL 2 MG/2ML IJ SOLN
INTRAMUSCULAR | Status: AC
  Filled 2020-06-14: qty 2

## 2020-06-14 MED ORDER — OXYCODONE HCL 5 MG PO TABS: 5.0000 mg | ORAL_TABLET | Freq: Four times a day (QID) | ORAL | 0 refills | Status: AC | PRN

## 2020-06-14 MED ORDER — RIVAROXABAN 10 MG PO TABS: 10.0000 mg | ORAL_TABLET | Freq: Every day | ORAL | 0 refills | Status: DC

## 2020-06-14 MED ORDER — ONDANSETRON HCL 4 MG/2ML IJ SOLN
INTRAMUSCULAR | Status: DC | PRN
  Administered 2020-06-14: 4 mg via INTRAVENOUS

## 2020-06-14 MED ORDER — FENTANYL CITRATE (PF) 100 MCG/2ML IJ SOLN
100.0000 ug | Freq: Once | INTRAMUSCULAR | Status: AC
  Administered 2020-06-14: 50 ug via INTRAVENOUS

## 2020-06-14 MED ORDER — OXYCODONE HCL 5 MG PO TABS: 5.0000 mg | ORAL_TABLET | Freq: Once | ORAL | Status: DC | PRN

## 2020-06-14 MED ORDER — ROPIVACAINE HCL 5 MG/ML IJ SOLN
INTRAMUSCULAR | Status: DC | PRN
  Administered 2020-06-14: 25 mL via PERINEURAL
  Administered 2020-06-14: 15 mL via PERINEURAL

## 2020-06-14 MED ORDER — ONDANSETRON HCL 4 MG/2ML IJ SOLN: 4.0000 mg | Freq: Once | INTRAMUSCULAR | Status: DC | PRN

## 2020-06-14 MED ORDER — MIDAZOLAM HCL 2 MG/2ML IJ SOLN
2.0000 mg | Freq: Once | INTRAMUSCULAR | Status: AC
  Administered 2020-06-14: 1 mg via INTRAVENOUS

## 2020-06-14 MED ORDER — EPHEDRINE 5 MG/ML INJ
INTRAVENOUS | Status: AC
  Filled 2020-06-14: qty 10

## 2020-06-14 MED ORDER — AMISULPRIDE (ANTIEMETIC) 5 MG/2ML IV SOLN: 10.0000 mg | Freq: Once | INTRAVENOUS | Status: DC | PRN

## 2020-06-14 MED ORDER — LACTATED RINGERS IV SOLN: INTRAVENOUS | Status: DC

## 2020-06-14 MED ORDER — PROPOFOL 10 MG/ML IV BOLUS
INTRAVENOUS | Status: DC | PRN
  Administered 2020-06-14: 170 mg via INTRAVENOUS

## 2020-06-14 MED ORDER — CEFAZOLIN SODIUM-DEXTROSE 2-4 GM/100ML-% IV SOLN
INTRAVENOUS | Status: AC
  Filled 2020-06-14: qty 100

## 2020-06-14 MED ORDER — FENTANYL CITRATE (PF) 100 MCG/2ML IJ SOLN
INTRAMUSCULAR | Status: AC
  Filled 2020-06-14: qty 2

## 2020-06-14 MED ORDER — DEXAMETHASONE SODIUM PHOSPHATE 10 MG/ML IJ SOLN
INTRAMUSCULAR | Status: DC | PRN
  Administered 2020-06-14: 5 mg

## 2020-06-14 MED ORDER — PROPOFOL 10 MG/ML IV BOLUS
INTRAVENOUS | Status: AC
  Filled 2020-06-14: qty 20

## 2020-06-14 MED ORDER — SODIUM CHLORIDE 0.9 % IV SOLN: INTRAVENOUS | Status: DC

## 2020-06-14 MED ORDER — LIDOCAINE HCL (CARDIAC) PF 100 MG/5ML IV SOSY
PREFILLED_SYRINGE | INTRAVENOUS | Status: DC | PRN
  Administered 2020-06-14: 50 mg via INTRAVENOUS

## 2020-06-14 SURGICAL SUPPLY — 81 items
APL PRP STRL LF DISP 70% ISPRP (MISCELLANEOUS) ×1
BANDAGE ESMARK 6X9 LF (GAUZE/BANDAGES/DRESSINGS) IMPLANT
BIT DRILL 2.5X2.75 QC CALB (BIT) ×2 IMPLANT
BIT DRILL 3.5X5.5 QC CALB (BIT) ×2 IMPLANT
BLADE SURG 15 STRL LF DISP TIS (BLADE) ×2 IMPLANT
BLADE SURG 15 STRL SS (BLADE) ×6
BNDG CMPR 9X4 STRL LF SNTH (GAUZE/BANDAGES/DRESSINGS)
BNDG CMPR 9X6 STRL LF SNTH (GAUZE/BANDAGES/DRESSINGS)
BNDG COHESIVE 4X5 TAN STRL (GAUZE/BANDAGES/DRESSINGS) ×3 IMPLANT
BNDG COHESIVE 6X5 TAN STRL LF (GAUZE/BANDAGES/DRESSINGS) ×3 IMPLANT
BNDG ESMARK 4X9 LF (GAUZE/BANDAGES/DRESSINGS) IMPLANT
BNDG ESMARK 6X9 LF (GAUZE/BANDAGES/DRESSINGS)
CANISTER SUCT 1200ML W/VALVE (MISCELLANEOUS) ×3 IMPLANT
CHLORAPREP W/TINT 26 (MISCELLANEOUS) ×3 IMPLANT
COVER BACK TABLE 60X90IN (DRAPES) ×3 IMPLANT
COVER WAND RF STERILE (DRAPES) IMPLANT
CUFF TOURN SGL QUICK 34 (TOURNIQUET CUFF) ×3
CUFF TRNQT CYL 34X4.125X (TOURNIQUET CUFF) IMPLANT
DECANTER SPIKE VIAL GLASS SM (MISCELLANEOUS) IMPLANT
DRAPE EXTREMITY T 121X128X90 (DISPOSABLE) ×3 IMPLANT
DRAPE OEC MINIVIEW 54X84 (DRAPES) ×3 IMPLANT
DRAPE U-SHAPE 47X51 STRL (DRAPES) ×3 IMPLANT
DRSG MEPITEL 4X7.2 (GAUZE/BANDAGES/DRESSINGS) ×3 IMPLANT
DRSG PAD ABDOMINAL 8X10 ST (GAUZE/BANDAGES/DRESSINGS) ×6 IMPLANT
ELECT REM PT RETURN 9FT ADLT (ELECTROSURGICAL) ×3
ELECTRODE REM PT RTRN 9FT ADLT (ELECTROSURGICAL) ×1 IMPLANT
GAUZE SPONGE 4X4 12PLY STRL (GAUZE/BANDAGES/DRESSINGS) ×3 IMPLANT
GLOVE BIO SURGEON STRL SZ8 (GLOVE) ×3 IMPLANT
GLOVE ECLIPSE 6.5 STRL STRAW (GLOVE) ×2 IMPLANT
GLOVE ECLIPSE 8.0 STRL XLNG CF (GLOVE) ×1 IMPLANT
GLOVE SRG 8 PF TXTR STRL LF DI (GLOVE) ×2 IMPLANT
GLOVE SURG UNDER POLY LF SZ8 (GLOVE) ×6
GOWN STRL REUS W/ TWL LRG LVL3 (GOWN DISPOSABLE) ×1 IMPLANT
GOWN STRL REUS W/ TWL XL LVL3 (GOWN DISPOSABLE) ×2 IMPLANT
GOWN STRL REUS W/TWL LRG LVL3 (GOWN DISPOSABLE) ×3
GOWN STRL REUS W/TWL XL LVL3 (GOWN DISPOSABLE) ×3
K-WIRE ACE 1.6X6 (WIRE) ×3
KWIRE ACE 1.6X6 (WIRE) IMPLANT
NEEDLE HYPO 22GX1.5 SAFETY (NEEDLE) IMPLANT
NS IRRIG 1000ML POUR BTL (IV SOLUTION) ×3 IMPLANT
PACK BASIN DAY SURGERY FS (CUSTOM PROCEDURE TRAY) ×3 IMPLANT
PAD CAST 4YDX4 CTTN HI CHSV (CAST SUPPLIES) ×1 IMPLANT
PADDING CAST ABS 4INX4YD NS (CAST SUPPLIES)
PADDING CAST ABS COTTON 4X4 ST (CAST SUPPLIES) IMPLANT
PADDING CAST COTTON 4X4 STRL (CAST SUPPLIES) ×3
PADDING CAST COTTON 6X4 STRL (CAST SUPPLIES) ×3 IMPLANT
PENCIL SMOKE EVACUATOR (MISCELLANEOUS) ×3 IMPLANT
PLATE ACE 100DEG 7HOLE (Plate) ×2 IMPLANT
PLATE ACE 3.5MM 2HOLE (Plate) ×2 IMPLANT
SANITIZER HAND PURELL 535ML FO (MISCELLANEOUS) ×3 IMPLANT
SCREW ACE CAN 4.0 40M (Screw) ×2 IMPLANT
SCREW CORT FT 32X3.5XNONLOCK (Screw) IMPLANT
SCREW CORTICAL 3.5MM  16MM (Screw) ×6 IMPLANT
SCREW CORTICAL 3.5MM  32MM (Screw) ×3 IMPLANT
SCREW CORTICAL 3.5MM 14MM (Screw) ×6 IMPLANT
SCREW CORTICAL 3.5MM 16MM (Screw) IMPLANT
SCREW CORTICAL 3.5MM 18MM (Screw) ×2 IMPLANT
SCREW CORTICAL 3.5MM 24MM (Screw) ×4 IMPLANT
SCREW CORTICAL 3.5MM 32MM (Screw) ×1 IMPLANT
SHEET MEDIUM DRAPE 40X70 STRL (DRAPES) ×3 IMPLANT
SLEEVE SCD COMPRESS KNEE MED (MISCELLANEOUS) ×3 IMPLANT
SPLINT FAST PLASTER 5X30 (CAST SUPPLIES) ×40
SPLINT PLASTER CAST FAST 5X30 (CAST SUPPLIES) ×20 IMPLANT
SPONGE LAP 18X18 RF (DISPOSABLE) ×3 IMPLANT
STOCKINETTE 6  STRL (DRAPES) ×3
STOCKINETTE 6 STRL (DRAPES) ×1 IMPLANT
SUCTION FRAZIER HANDLE 10FR (MISCELLANEOUS) ×3
SUCTION TUBE FRAZIER 10FR DISP (MISCELLANEOUS) ×1 IMPLANT
SUT ETHILON 3 0 PS 1 (SUTURE) ×3 IMPLANT
SUT FIBERWIRE #2 38 T-5 BLUE (SUTURE)
SUT MNCRL AB 3-0 PS2 18 (SUTURE) ×2 IMPLANT
SUT VIC AB 0 SH 27 (SUTURE) IMPLANT
SUT VIC AB 2-0 SH 27 (SUTURE) ×3
SUT VIC AB 2-0 SH 27XBRD (SUTURE) ×1 IMPLANT
SUTURE FIBERWR #2 38 T-5 BLUE (SUTURE) IMPLANT
SYR BULB EAR ULCER 3OZ GRN STR (SYRINGE) ×3 IMPLANT
SYR CONTROL 10ML LL (SYRINGE) IMPLANT
TOWEL GREEN STERILE FF (TOWEL DISPOSABLE) ×6 IMPLANT
TUBE CONNECTING 20'X1/4 (TUBING) ×1
TUBE CONNECTING 20X1/4 (TUBING) ×2 IMPLANT
UNDERPAD 30X36 HEAVY ABSORB (UNDERPADS AND DIAPERS) ×3 IMPLANT

## 2020-06-14 NOTE — H&P (Signed)
Catherine Mayer is an 69 y.o. female.   Chief Complaint: right ankle injury HPI: The patient is a 69 year old female without significant past medical history.  She fractured her right ankle when she fell in her yard last week.  She presents now for operative treatment of this displaced and unstable trimalleolar right ankle fracture.  Past Medical History:  Diagnosis Date  . Anxiety   . Arthritis    hands, knees  . Depression   . GERD (gastroesophageal reflux disease)   . Hyperlipidemia   . Hypertension   . Hypothyroidism   . Thyroid disease   . Trimalleolar fracture of ankle, closed, right, initial encounter     Past Surgical History:  Procedure Laterality Date  . ABDOMINAL HYSTERECTOMY    . CARPAL TUNNEL RELEASE Bilateral 2013  . CHOLECYSTECTOMY      Family History  Problem Relation Age of Onset  . Hypertension Mother   . Hyperlipidemia Mother   . Hypertension Father   . Hypertension Sister   . Hypertension Son   . Hyperlipidemia Son   . Hypertension Sister   . Hypertension Sister    Social History:  reports that she has never smoked. She has never used smokeless tobacco. She reports previous alcohol use. She reports that she does not use drugs.  Allergies: No Known Allergies  Medications Prior to Admission  Medication Sig Dispense Refill  . amLODipine (NORVASC) 5 MG tablet TAKE 1 TABLET BY MOUTH  DAILY 90 tablet 1  . diclofenac Sodium (VOLTAREN) 1 % GEL     . escitalopram (LEXAPRO) 20 MG tablet Take 1 tablet (20 mg total) by mouth daily. 90 tablet 3  . levothyroxine (SYNTHROID) 137 MCG tablet TAKE 1 TABLET BY MOUTH  DAILY BEFORE BREAKFAST 90 tablet 3  . losartan-hydrochlorothiazide (HYZAAR) 100-25 MG tablet Take 1 tablet by mouth daily. 90 tablet 1  . meloxicam (MOBIC) 15 MG tablet Take 15 mg by mouth daily as needed for pain.    Marland Kitchen omeprazole (PRILOSEC) 20 MG capsule Take 20 mg by mouth daily.     . pravastatin (PRAVACHOL) 80 MG tablet TAKE 1 TABLET BY MOUTH  DAILY 90  tablet 3  . HYDROcodone-acetaminophen (NORCO/VICODIN) 5-325 MG tablet Take 1 tablet by mouth every 6 (six) hours as needed for severe pain. 6 tablet 0  . LORazepam (ATIVAN) 0.5 MG tablet Take 1 tablet (0.5 mg total) by mouth daily as needed for anxiety. 90 tablet 1  . triamcinolone cream (KENALOG) 0.1 % Apply 1 application topically 2 (two) times daily. x7-10d 30 g 0    No results found for this or any previous visit (from the past 48 hour(s)). No results found.  Review of Systems no recent fever, chills, nausea, vomiting or changes in her appetite  Blood pressure 120/77, pulse 80, temperature 98.4 F (36.9 C), temperature source Oral, resp. rate 19, height 5\' 1"  (1.549 m), weight 70.9 kg, SpO2 99 %. Physical Exam  Well-nourished well-developed woman in no apparent distress.  Alert and oriented x4.  Normal mood and affect.  Gait is nonweightbearing on the right.  Right ankle has moderate swelling.  Small blisters are evident at the medial ankle.  Generally the skin still wrinkles.  Pulses are palpable in the foot.  No lymphadenopathy.  Intact sensibility to light touch dorsally and plantarly at the forefoot.   Assessment/Plan Right ankle trimalleolar fracture -to the operating room today for open treatment with internal fixation.  The risks and benefits of the alternative treatment options  have been discussed in detail.  The patient wishes to proceed with surgery and specifically understands risks of bleeding, infection, nerve damage, blood clots, need for additional surgery, amputation and death.   Toni Arthurs, MD 07-07-20, 1:28 PM

## 2020-06-14 NOTE — Discharge Instructions (Signed)
Catherine Arthurs, MD [facility]  Please read the following information regarding your care after surgery.  Medications  You only need a prescription for the narcotic pain medicine (ex. oxycodone, Percocet, Norco).  All of the other medicines listed below are available over the counter. X Aleve 2 pills twice a day for the first 3 days after surgery. X acetominophen (Tylenol) 650 mg every 4-6 hours as you need for minor to moderate pain X oxycodone as prescribed for severe pain  Narcotic pain medicine (ex. oxycodone, Percocet, Vicodin) will cause constipation.  To prevent this problem, take the following medicines while you are taking any pain medicine. X docusate sodium (Colace) 100 mg twice a day X senna (Senokot) 2 tablets twice a day  X To help prevent blood clots, take Xarelto once a day for two weeks after surgery.  You should also get up every hour while you are awake to move around.    Weight Bearing ? Bear weight when you are able on your operated leg or foot. ? Bear weight only on your operated foot in the post-op shoe. X Do not bear any weight on the operated leg or foot.  Cast / Splint / Dressing X Keep your splint, cast or dressing clean and dry.  Don't put anything (coat hanger, pencil, etc) down inside of it.  If it gets damp, use a hair dryer on the cool setting to dry it.  If it gets soaked, call the office to schedule an appointment for a cast change. ? Remove your dressing 3 days after surgery and cover the incisions with dry dressings.    After your dressing, cast or splint is removed; you may shower, but do not soak or scrub the wound.  Allow the water to run over it, and then gently pat it dry.  Swelling It is normal for you to have swelling where you had surgery.  To reduce swelling and pain, keep your toes above your nose for at least 3 days after surgery.  It may be necessary to keep your foot or leg elevated for several weeks.  If it hurts, it should be  elevated.  Follow Up Call my office at [phone] when you are discharged from the hospital or surgery center to schedule an appointment to be seen two weeks after surgery.  Call my office at [phone] if you develop a fever >101.5 F, nausea, vomiting, bleeding from the surgical site or severe pain.     Post Anesthesia Home Care Instructions  Activity: Get plenty of rest for the remainder of the day. A responsible individual must stay with you for 24 hours following the procedure.  For the next 24 hours, DO NOT: -Drive a car -Advertising copywriter -Drink alcoholic beverages -Take any medication unless instructed by your physician -Make any legal decisions or sign important papers.  Meals: Start with liquid foods such as gelatin or soup. Progress to regular foods as tolerated. Avoid greasy, spicy, heavy foods. If nausea and/or vomiting occur, drink only clear liquids until the nausea and/or vomiting subsides. Call your physician if vomiting continues.  Special Instructions/Symptoms: Your throat may feel dry or sore from the anesthesia or the breathing tube placed in your throat during surgery. If this causes discomfort, gargle with warm salt water. The discomfort should disappear within 24 hours.  If you had a scopolamine patch placed behind your ear for the management of post- operative nausea and/or vomiting:  1. The medication in the patch is effective for 72 hours,  after which it should be removed.  Wrap patch in a tissue and discard in the trash. Wash hands thoroughly with soap and water. 2. You may remove the patch earlier than 72 hours if you experience unpleasant side effects which may include dry mouth, dizziness or visual disturbances. 3. Avoid touching the patch. Wash your hands with soap and water after contact with the patch.    Regional Anesthesia Blocks  1. Numbness or the inability to move the "blocked" extremity may last from 3-48 hours after placement. The length  of time depends on the medication injected and your individual response to the medication. If the numbness is not going away after 48 hours, call your surgeon.  2. The extremity that is blocked will need to be protected until the numbness is gone and the  Strength has returned. Because you cannot feel it, you will need to take extra care to avoid injury. Because it may be weak, you may have difficulty moving it or using it. You may not know what position it is in without looking at it while the block is in effect.  3. For blocks in the legs and feet, returning to weight bearing and walking needs to be done carefully. You will need to wait until the numbness is entirely gone and the strength has returned. You should be able to move your leg and foot normally before you try and bear weight or walk. You will need someone to be with you when you first try to ensure you do not fall and possibly risk injury.  4. Bruising and tenderness at the needle site are common side effects and will resolve in a few days.  5. Persistent numbness or new problems with movement should be communicated to the surgeon or the [facility] [phone] [facility] [phone]).

## 2020-06-14 NOTE — Anesthesia Procedure Notes (Signed)
Procedure Name: LMA Insertion Date/Time: 06/14/2020 1:37 PM Performed by: Cleda Clarks, CRNA Pre-anesthesia Checklist: Patient identified, Emergency Drugs available, Suction available and Patient being monitored Patient Re-evaluated:Patient Re-evaluated prior to induction Oxygen Delivery Method: Circle system utilized Preoxygenation: Pre-oxygenation with 100% oxygen Induction Type: IV induction Ventilation: Mask ventilation without difficulty LMA: LMA inserted LMA Size: 4.0 Number of attempts: 1 Placement Confirmation: positive ETCO2 Tube secured with: Tape Dental Injury: Teeth and Oropharynx as per pre-operative assessment

## 2020-06-14 NOTE — Anesthesia Preprocedure Evaluation (Signed)
Anesthesia Evaluation  Patient identified by MRN, date of birth, ID band Patient awake    Reviewed: Allergy & Precautions, NPO status , Patient's Chart, lab work & pertinent test results  History of Anesthesia Complications Negative for: history of anesthetic complications  Airway Mallampati: II  TM Distance: >3 FB Neck ROM: Full    Dental  (+) Teeth Intact   Pulmonary neg pulmonary ROS,    Pulmonary exam normal        Cardiovascular hypertension, Normal cardiovascular exam     Neuro/Psych Anxiety Depression negative neurological ROS     GI/Hepatic Neg liver ROS, GERD  ,  Endo/Other  Hypothyroidism   Renal/GU negative Renal ROS  negative genitourinary   Musculoskeletal  (+) Arthritis ,   Abdominal   Peds  Hematology negative hematology ROS (+)   Anesthesia Other Findings   Reproductive/Obstetrics                            Anesthesia Physical Anesthesia Plan  ASA: II  Anesthesia Plan: General   Post-op Pain Management: GA combined w/ Regional for post-op pain   Induction: Intravenous  PONV Risk Score and Plan: 3 and Ondansetron, Dexamethasone, Midazolam and Treatment may vary due to age or medical condition  Airway Management Planned: LMA  Additional Equipment: None  Intra-op Plan:   Post-operative Plan: Extubation in OR  Informed Consent: I have reviewed the patients History and Physical, chart, labs and discussed the procedure including the risks, benefits and alternatives for the proposed anesthesia with the patient or authorized representative who has indicated his/her understanding and acceptance.     Dental advisory given  Plan Discussed with:   Anesthesia Plan Comments:         Anesthesia Quick Evaluation

## 2020-06-14 NOTE — Op Note (Signed)
06/14/2020  2:52 PM  PATIENT:  Catherine Mayer  69 y.o. female  PRE-OPERATIVE DIAGNOSIS:  right ankle trimalleolar fracture  POST-OPERATIVE DIAGNOSIS:  right ankle trimalleolar fracture  Procedure(s): 1.  Open treatment right ankle trimalleolar fracture with internal fixation without fixation of the posterior lip 2.  Stress exam of right ankle 3.  AP, mortise and lateral xrays of the right ankle  SURGEON:  Toni Arthurs, MD  ASSISTANT:  none  ANESTHESIA:   General, regional  EBL:  minimal   TOURNIQUET:   Total Tourniquet Time Documented: Thigh (Right) - 51 minutes Total: Thigh (Right) - 51 minutes  COMPLICATIONS:  None apparent  DISPOSITION:  Extubated, awake and stable to recovery.  INDICATION FOR PROCEDURE: The patient is a 69 year old female without significant past medical history.  She fell last week injuring her right ankle.  Radiographs reveal a displaced trimalleolar ankle fracture.  She presents now for operative treatment of this displaced and unstable right ankle injury.  The risks and benefits of the alternative treatment options have been discussed in detail.  The patient wishes to proceed with surgery and specifically understands risks of bleeding, infection, nerve damage, blood clots, need for additional surgery, amputation and death.  PROCEDURE IN DETAIL:  After pre operative consent was obtained, and the correct operative site was identified, the patient was brought to the operating room and placed supine on the OR table.  Anesthesia was administered.  Pre-operative antibiotics were administered.  A surgical timeout was taken.  The right lower extremity was prepped and draped in standard sterile fashion with a tourniquet around the thigh.  The extremity was elevated and the tourniquet was inflated to 250 mmHg.  A longitudinal incision was made over the lateral malleolus.  Dissection was carried down through the subcutaneous tissues.  Care was taken to protect the superficial  peroneal nerve.  Fracture site was identified.  Was cleaned of all hematoma.  There was a large comminuted butterfly fragment anterior to the oblique Weber B fracture line.  The anterior fragment was reduced to the distal fragment and secured with a tenaculum.  The distal fragment was then secured to the proximal fragment with a lobster claw clamp.  A 3.5 mm fully threaded lag screw was inserted from distal to proximal across the primary fracture line.  A 7 hole one third tubular plate was then contoured to fit the lateral malleolus.  It was secured proximally with 4 bicortical screws and distally with 2 unicortical screws.  An anterior to posterior lag screw was then inserted securing the butterfly fragment to the distal fragment.  AP, mortise and lateral radiographs confirmed appropriate reduction of the fibula fracture in appropriate position and length of the hardware.  Attention was turned to the medial side of the ankle where a longitudinal incision was made.  Dissection was carried down through the subcutaneous tissues.  The fracture site was identified.  The fracture was oblique and nearly vertical at the anterior aspect of the distal tibia.  The fracture was reduced and provisionally held.  A 2 hole one third tubular plate was contoured to fit the anteromedial tibia.  It was placed at the apex of the fracture and secured proximal to the fracture line with a 3.5 mm fully threaded screw.  The plate buttress to the anteromedial fracture line appropriately.  A K wire was then inserted from the tip of the medial malleolus into the metaphyseal bone of the distal tibia.  Radiographs confirmed appropriate position of the K  wire.  A 4 mm partially-threaded cannulated screw was inserted from the [employer] small frag set.  It was noted to have excellent purchase and compressed the fracture site appropriately.  AP, mortise and lateral radiographs of the right ankle were then obtained.  These show interval  reduction of the lateral, medial and posterior malleolus fractures.  Posterior malleolus fracture was noted to be appropriately reduced and did not involve a significant amount of the articular surface.  A stress examination was then performed with no widening of the ankle mortise or medial clear space.  The wounds were irrigated copiously and sprinkled with vancomycin powder.  Deep subcutaneous tissues were approximated with 2-0 Vicryl.  Skin incisions were closed with running 3-0 nylon.  Sterile dressings were applied followed by a well-padded short leg splint.  The tourniquet was released after application of the dressings.  The patient was awakened from anesthesia and transported to the recovery room in stable condition.   FOLLOW UP PLAN: Nonweightbearing on the right lower extremity.  Follow-up in 2 weeks for suture removal and conversion to a short leg cast.  Xarelto for DVT prophylaxis due to the patient's age and obesity.  RADIOGRAPHS: AP, mortise and lateral radiographs of the right ankle are obtained intraoperatively.  These show interval reduction and fixation of the trimalleolar ankle fracture.  Hardware is appropriately positioned and of the appropriate lengths.  No other acute injuries are noted.

## 2020-06-14 NOTE — Anesthesia Postprocedure Evaluation (Signed)
Anesthesia Post Note  Patient: Catherine Mayer  Procedure(s) Performed: Open treatment right ankle trimalleolar fracture with internal fixation (Right Ankle)     Patient location during evaluation: PACU Anesthesia Type: General Level of consciousness: awake and alert Pain management: pain level controlled Vital Signs Assessment: post-procedure vital signs reviewed and stable Respiratory status: spontaneous breathing, nonlabored ventilation and respiratory function stable Cardiovascular status: blood pressure returned to baseline and stable Postop Assessment: no apparent nausea or vomiting Anesthetic complications: no   No complications documented.  Last Vitals:  Vitals:   06/14/20 1305 06/14/20 1450  BP: 120/77 (!) 155/81  Pulse: 80 88  Resp: 19 16  Temp:  37.3 C  SpO2: 99% 100%    Last Pain:  Vitals:   06/14/20 1450  TempSrc:   PainSc: 0-No pain                 Lucretia Kern

## 2020-06-14 NOTE — Anesthesia Procedure Notes (Signed)
Anesthesia Regional Block: Adductor canal block   Pre-Anesthetic Checklist: ,, timeout performed, Correct Patient, Correct Site, Correct Laterality, Correct Procedure, Correct Position, site marked, Risks and benefits discussed,  Surgical consent,  Pre-op evaluation,  At surgeon's request and post-op pain management  Laterality: Right  Prep: chloraprep       Needles:  Injection technique: Single-shot  Needle Type: Echogenic Stimulator Needle     Needle Length: 10cm  Needle Gauge: 20     Additional Needles:   Procedures:,,,, ultrasound used (permanent image in chart),,,,  Narrative:  Start time: 06/14/2020 12:57 PM End time: 06/14/2020 1:01 PM Injection made incrementally with aspirations every 5 mL.  Performed by: Personally  Anesthesiologist: Lucretia Kern, MD  Additional Notes: Standard monitors applied. Skin prepped. Good needle visualization with ultrasound. Injection made in 5cc increments with no resistance to injection. Patient tolerated the procedure well.

## 2020-06-14 NOTE — Anesthesia Procedure Notes (Signed)
Anesthesia Regional Block: Popliteal block   Pre-Anesthetic Checklist: ,, timeout performed, Correct Patient, Correct Site, Correct Laterality, Correct Procedure, Correct Position, site marked, Risks and benefits discussed,  Surgical consent,  Pre-op evaluation,  At surgeon's request and post-op pain management  Laterality: Right  Prep: chloraprep       Needles:  Injection technique: Single-shot  Needle Type: Echogenic Stimulator Needle     Needle Length: 10cm  Needle Gauge: 20     Additional Needles:   Procedures:,,,, ultrasound used (permanent image in chart),,,,  Narrative:  Start time: 06/14/2020 1:01 PM End time: 06/14/2020 1:03 PM  Performed by: Personally  Anesthesiologist: Lucretia Kern, MD  Additional Notes: Standard monitors applied. Skin prepped. Good needle visualization with ultrasound. Injection made in 5cc increments with no resistance to injection. Patient tolerated the procedure well.

## 2020-06-14 NOTE — Progress Notes (Signed)
Assisted Dr. Witman with right, ultrasound guided, popliteal, adductor canal block. Side rails up, monitors on throughout procedure. See vital signs in flow sheet. Tolerated Procedure well. 

## 2020-06-14 NOTE — Transfer of Care (Signed)
Immediate Anesthesia Transfer of Care Note  Patient: Catherine Mayer  Procedure(s) Performed: Open treatment right ankle trimalleolar fracture with internal fixation (Right Ankle)  Patient Location: PACU  Anesthesia Type:GA combined with regional for post-op pain  Level of Consciousness: awake, alert , drowsy and patient cooperative  Airway & Oxygen Therapy: Patient Spontanous Breathing and Patient connected to face mask oxygen  Post-op Assessment: Report given to RN and Post -op Vital signs reviewed and stable  Post vital signs: Reviewed and stable  Last Vitals:  Vitals Value Taken Time  BP 155/81 06/14/20 1450  Temp    Pulse 87 06/14/20 1452  Resp 17 06/14/20 1452  SpO2 100 % 06/14/20 1452  Vitals shown include unvalidated device data.  Last Pain:  Vitals:   06/14/20 1149  TempSrc: Oral  PainSc: 2          Complications: No complications documented.

## 2020-06-15 ENCOUNTER — Encounter (HOSPITAL_BASED_OUTPATIENT_CLINIC_OR_DEPARTMENT_OTHER): Payer: Self-pay | Admitting: Orthopedic Surgery

## 2020-06-15 DIAGNOSIS — S82851A Displaced trimalleolar fracture of right lower leg, initial encounter for closed fracture: Secondary | ICD-10-CM | POA: Insufficient documentation

## 2020-06-15 HISTORY — DX: Displaced trimalleolar fracture of right lower leg, initial encounter for closed fracture: S82.851A

## 2020-06-25 ENCOUNTER — Encounter: Payer: Self-pay | Admitting: *Deleted

## 2020-06-27 DIAGNOSIS — S82851A Displaced trimalleolar fracture of right lower leg, initial encounter for closed fracture: Secondary | ICD-10-CM | POA: Diagnosis not present

## 2020-07-10 ENCOUNTER — Other Ambulatory Visit: Payer: Self-pay | Admitting: Family Medicine

## 2020-07-10 DIAGNOSIS — I10 Essential (primary) hypertension: Secondary | ICD-10-CM

## 2020-07-16 ENCOUNTER — Ambulatory Visit: Payer: Medicare Other | Admitting: Family Medicine

## 2020-07-30 DIAGNOSIS — S82851D Displaced trimalleolar fracture of right lower leg, subsequent encounter for closed fracture with routine healing: Secondary | ICD-10-CM | POA: Diagnosis not present

## 2020-08-14 ENCOUNTER — Ambulatory Visit (INDEPENDENT_AMBULATORY_CARE_PROVIDER_SITE_OTHER): Payer: Medicare Other | Admitting: Family Medicine

## 2020-08-14 ENCOUNTER — Other Ambulatory Visit: Payer: Self-pay

## 2020-08-14 ENCOUNTER — Encounter: Payer: Self-pay | Admitting: Family Medicine

## 2020-08-14 VITALS — BP 128/82 | HR 85 | Temp 97.8°F | Ht 61.0 in | Wt 158.8 lb

## 2020-08-14 DIAGNOSIS — Z8781 Personal history of (healed) traumatic fracture: Secondary | ICD-10-CM | POA: Diagnosis not present

## 2020-08-14 DIAGNOSIS — Z79899 Other long term (current) drug therapy: Secondary | ICD-10-CM

## 2020-08-14 DIAGNOSIS — I10 Essential (primary) hypertension: Secondary | ICD-10-CM

## 2020-08-14 DIAGNOSIS — E034 Atrophy of thyroid (acquired): Secondary | ICD-10-CM

## 2020-08-14 DIAGNOSIS — F411 Generalized anxiety disorder: Secondary | ICD-10-CM

## 2020-08-14 DIAGNOSIS — F410 Panic disorder [episodic paroxysmal anxiety] without agoraphobia: Secondary | ICD-10-CM

## 2020-08-14 MED ORDER — LORAZEPAM 0.5 MG PO TABS: 0.5000 mg | ORAL_TABLET | Freq: Every day | ORAL | 1 refills | Status: DC | PRN

## 2020-08-14 NOTE — Progress Notes (Signed)
Subjective: CC: Hypothyroidism, pain attack PCP: [name], DO HPI:Catherine Mayer is a 70 y.o. female presenting to clinic today for:  1.  Hypothyroidism Patient reports compliance with 125 mcg of Synthroid 2 days/week with 137 mcg daily all other days. No reports of tremor, heart palpitations, change in vision or bowel habits. She is on no biotin-containing products  2.  General anxiety disorder with panic attack Does need a refill of her Ativan. She is compliant with her Lexapro. Continues uses extremely rarely (typically only when her husband is drinking and causes her nerves to be shot). Denies any excessive daytime sedation, falls, respiratory depression, visual or auditory hallucinations.  3.  Hypertension Compliant with Hyzaar 100-25 mg daily and amlodipine 5 mg daily. No reports of chest pain, shortness of breath or edema  4.  History of right ankle fracture Status post repair in December.  She was given opioids by her specialist on 3 occasions due to this fracture and subsequent surgery. She is not on any of these opioids anymore. She has completed the Xarelto. She is currently in a walking boot and will be in this for probably another month or so before she is transition out of it. Overall she seems to be doing well from a healing standpoint.   ROS: Per HPI  No Known Allergies Past Medical History:  Diagnosis Date  . Anxiety   . Arthritis    hands, knees  . Depression   . GERD (gastroesophageal reflux disease)   . Hyperlipidemia   . Hypertension   . Hypothyroidism   . Thyroid disease   . Trimalleolar fracture of ankle, closed, right, initial encounter     Current Outpatient Medications:  .  amLODipine (NORVASC) 5 MG tablet, TAKE 1 TABLET BY MOUTH  DAILY, Disp: 90 tablet, Rfl: 0 .  diclofenac Sodium (VOLTAREN) 1 % GEL, , Disp: , Rfl:  .  escitalopram (LEXAPRO) 20 MG tablet, Take 1 tablet (20 mg total) by mouth daily., Disp: 90 tablet, Rfl: 3 .  levothyroxine  (SYNTHROID) 137 MCG tablet, TAKE 1 TABLET BY MOUTH  DAILY BEFORE BREAKFAST, Disp: 90 tablet, Rfl: 3 .  LORazepam (ATIVAN) 0.5 MG tablet, Take 1 tablet (0.5 mg total) by mouth daily as needed for anxiety., Disp: 90 tablet, Rfl: 1 .  losartan-hydrochlorothiazide (HYZAAR) 100-25 MG tablet, Take 1 tablet by mouth daily., Disp: 90 tablet, Rfl: 1 .  meloxicam (MOBIC) 15 MG tablet, Take 15 mg by mouth daily as needed for pain., Disp: , Rfl:  .  omeprazole (PRILOSEC) 20 MG capsule, Take 20 mg by mouth daily. , Disp: , Rfl:  .  pravastatin (PRAVACHOL) 80 MG tablet, TAKE 1 TABLET BY MOUTH  DAILY, Disp: 90 tablet, Rfl: 3 .  rivaroxaban (XARELTO) 10 MG TABS tablet, Take 1 tablet (10 mg total) by mouth daily., Disp: 14 tablet, Rfl: 0 .  triamcinolone cream (KENALOG) 0.1 %, Apply 1 application topically 2 (two) times daily. x7-10d, Disp: 30 g, Rfl: 0 Social History   Socioeconomic History  . Marital status: Married    Spouse name: [name]  . Number of children: 1  . Years of education: Not on file  . Highest education level: Associate degree: occupational, Scientist, product/process development, or vocational program  Occupational History  . 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 Smoking status: Never Smoker  . Smokeless tobacco: Never Used  Vaping Use  . Vaping Use: Never used  Substance and Sexual Activity  .  Alcohol use: Not Currently  . Drug use: Never  . Sexual activity: Not on file  Other Topics Concern  . Not on file  Social History Narrative   Enjoys playing with dogs and being outside.    Social Determinants of Health   Financial Resource Strain: Not on file  Food Insecurity: Not on file  Transportation Needs: Not on file  Physical Activity: Not on file  Stress: Not on file  Social Connections: Not on file  Intimate Partner Violence: Not on file   Family History  Problem Relation Age of Onset  . Hypertension Mother   . Hyperlipidemia Mother   . Hypertension Father   .  Hypertension Sister   . Hypertension Son   . Hyperlipidemia Son   . Hypertension Sister   . Hypertension Sister     Objective: Office vital signs reviewed. BP 128/82   Pulse 85   Temp 97.8 F (36.6 C)   Ht 5\' 1"  (1.549 m)   Wt 158 lb 12.8 oz (72 kg)   SpO2 96%   BMI 30.00 kg/m   Physical Examination:  General: Awake, alert, well nourished, No acute distress HEENT: Normal; no exophthalmos. No goiter Cardio: regular rate and rhythm, S1S2 heard, no murmurs appreciated Pulm: clear to auscultation bilaterally, no wheezes, rhonchi or rales; normal work of breathing on room air Extremities: warm, well perfused, No edema, cyanosis or clubbing; +2 pulses bilaterally MSK: Right ankle with healing postsurgical scar. There is slight edema but nothing to suggest complication. No evidence of secondary infection. No dehiscence. Range of motion looks fairly preserved. Psych: Mood stable, speech normal, affect appropriate. Patient is pleasant and interactive. Does not appear to be responding to internal stimuli  Depression screen [facility] 2/9 08/14/2020 08/14/2020 01/17/2020  Decreased Interest 0 0 0  Down, Depressed, Hopeless 1 0 0  PHQ - 2 Score 1 0 0  Altered sleeping 0 - 1  Tired, decreased energy 1 - 3  Change in appetite 0 - 0  Feeling bad or failure about yourself  0 - 1  Trouble concentrating 1 - 0  Moving slowly or fidgety/restless 0 - 0  Suicidal thoughts 0 - -  PHQ-9 Score 3 - 5  Difficult doing work/chores Somewhat difficult - -   GAD 7 : Generalized Anxiety Score 08/14/2020 01/17/2020 11/21/2019 08/17/2019  Nervous, Anxious, on Edge 1 1 1 1   Control/stop worrying 1 1 2 1   Worry too much - different things 1 1 2 1   Trouble relaxing 1 1 1  0  Restless 1 0 1 0  Easily annoyed or irritable 0 1 1 0  Afraid - awful might happen 0 0 1 1  Total GAD 7 Score 5 5 9 4   Anxiety Difficulty Somewhat difficult Somewhat difficult - Not difficult at all    Assessment/ Plan: 70 y.o. female    Hypothyroidism due to acquired atrophy of thyroid - Plan: Thyroid Panel With TSH  Essential hypertension with goal blood pressure less than 130/80 - Plan: Basic Metabolic Panel  Generalized anxiety disorder with panic attacks - Plan: ToxASSURE Select 13 (MW), Urine, LORazepam (ATIVAN) 0.5 MG tablet  Controlled substance agreement signed - Plan: ToxASSURE Select 13 (MW), Urine  History of fracture of right ankle  Asymptomatic from a thyroid standpoint. Check thyroid panel  Blood pressure is well controlled. Continue current regimen  She has judicious use of Ativan. The national narcotic database was reviewed and there were no red flags. UDS and CSC updated as per  office policy. Continue Lexapro.  The right ankle fracture seems to be healing. No evidence of secondary infection or complication. Continue to follow-up with orthopedics as directed  No orders of the defined types were placed in this encounter.  No orders of the defined types were placed in this encounter.    [name], DO [facility] [phone]

## 2020-08-15 ENCOUNTER — Other Ambulatory Visit: Payer: Self-pay

## 2020-08-15 LAB — BASIC METABOLIC PANEL
BUN/Creatinine Ratio: 13 (ref 12–28)
BUN: 12 mg/dL (ref 8–27)
CO2: 25 mmol/L (ref 20–29)
Calcium: 9.2 mg/dL (ref 8.7–10.3)
Chloride: 99 mmol/L (ref 96–106)
Creatinine, Ser: 0.94 mg/dL (ref 0.57–1.00)
GFR calc Af Amer: 72 mL/min/{1.73_m2} (ref >59)
GFR calc non Af Amer: 62 mL/min/{1.73_m2} (ref >59)
Glucose: 77 mg/dL (ref 65–99)
Potassium: 3.5 mmol/L (ref 3.5–5.2)
Sodium: 136 mmol/L (ref 134–144)

## 2020-08-15 LAB — THYROID PANEL WITH TSH
Free Thyroxine Index: 1.5 (ref 1.2–4.9)
T3 Uptake Ratio: 21 % — ABNORMAL LOW (ref 24–39)
T4, Total: 7 ug/dL (ref 4.5–12.0)
TSH: 26.2 u[IU]/mL — ABNORMAL HIGH (ref 0.450–4.500)

## 2020-08-15 MED ORDER — LEVOTHYROXINE SODIUM 137 MCG PO TABS: ORAL_TABLET | ORAL | 3 refills | Status: DC

## 2020-08-20 LAB — TOXASSURE SELECT 13 (MW), URINE

## 2020-08-22 ENCOUNTER — Other Ambulatory Visit: Payer: Self-pay | Admitting: Family Medicine

## 2020-08-22 MED ORDER — VALSARTAN-HYDROCHLOROTHIAZIDE 160-25 MG PO TABS: 1.0000 | ORAL_TABLET | Freq: Every day | ORAL | 1 refills | Status: DC

## 2020-08-27 DIAGNOSIS — S82851D Displaced trimalleolar fracture of right lower leg, subsequent encounter for closed fracture with routine healing: Secondary | ICD-10-CM | POA: Diagnosis not present

## 2020-08-29 ENCOUNTER — Ambulatory Visit (INDEPENDENT_AMBULATORY_CARE_PROVIDER_SITE_OTHER): Payer: Medicare Other | Admitting: *Deleted

## 2020-08-29 DIAGNOSIS — Z0000 Encounter for general adult medical examination without abnormal findings: Secondary | ICD-10-CM

## 2020-08-29 NOTE — Progress Notes (Signed)
MEDICARE ANNUAL WELLNESS VISIT  08/29/2020  Telephone Visit Disclaimer This Medicare AWV was conducted by telephone due to national recommendations for restrictions regarding the COVID-19 Pandemic (e.g. social distancing).  I verified, using two identifiers, that I am speaking with Catherine JanusAnn Clawson or their authorized healthcare agent. I discussed the limitations, risks, security, and privacy concerns of performing an evaluation and management service by telephone and the potential availability of an in-person appointment in the future. The patient expressed understanding and agreed to proceed.  Location of Patient: home Location of Provider (nurse): office  Subjective:    Catherine Mayer is a 70 y.o. female patient of Catherine Mayer, Catherine M, DO who had a Medicare Annual Wellness Visit today via telephone. Catherine Mayer is Working part time and lives with their spouse. she has 1 child. she reports that she is socially active and does interact with friends/family regularly. she is not physically active and enjoys gardening.  Patient Care Team: Catherine Mayer, Catherine M, DO as PCP - General (Family Medicine) Lavina HammanMeisinger, Todd, MD as Consulting Physician (Obstetrics and Gynecology)  Advanced Directives 08/29/2020 06/14/2020 06/11/2020 06/10/2020 08/29/2019 07/02/2018  Does Patient Have a Medical Advance Directive? No No No No No No  Would patient like information on creating a medical advance directive? No - Patient declined No - Patient declined No - Patient declined No - Patient declined No - Patient declined No - Patient declined    Hospital Utilization Over the Past 12 Months: # of hospitalizations or ER visits: 1 # of surgeries: 1  Review of Systems    Patient reports that her overall health is worse compared to last year.  History obtained from chart review and patient  Patient Reported Readings (BP, Pulse, CBG, Weight, etc) none  Pain Assessment Pain : No/denies pain     Current Medications & Allergies  (verified) Allergies as of 08/29/2020   No Known Allergies     Medication List       Accurate as of August 29, 2020 10:02 AM. If you have any questions, ask your nurse or doctor.        STOP taking these medications   rivaroxaban 10 MG Tabs tablet Commonly known as: Xarelto   triamcinolone 0.1 % Commonly known as: KENALOG     TAKE these medications   amLODipine 5 MG tablet Commonly known as: NORVASC TAKE 1 TABLET BY MOUTH  DAILY   diclofenac Sodium 1 % Gel Commonly known as: VOLTAREN   escitalopram 20 MG tablet Commonly known as: LEXAPRO Take 1 tablet (20 mg total) by mouth daily.   levothyroxine 137 MCG tablet Commonly known as: SYNTHROID TAKE 1 TABLET BY MOUTH  DAILY BEFORE BREAKFAST   LORazepam 0.5 MG tablet Commonly known as: ATIVAN Take 1 tablet (0.5 mg total) by mouth daily as needed for anxiety.   meloxicam 15 MG tablet Commonly known as: MOBIC Take 15 mg by mouth daily as needed for pain.   omeprazole 20 MG capsule Commonly known as: PRILOSEC Take 20 mg by mouth daily.   pravastatin 80 MG tablet Commonly known as: PRAVACHOL TAKE 1 TABLET BY MOUTH  DAILY   valsartan-hydrochlorothiazide 160-25 MG tablet Commonly known as: Diovan HCT Take 1 tablet by mouth daily. Replacing the Hyzaar       History (reviewed): Past Medical History:  Diagnosis Date  . Anxiety   . Arthritis    hands, knees  . Depression   . GERD (gastroesophageal reflux disease)   . Hyperlipidemia   . Hypertension   .  Hypothyroidism   . Thyroid disease   . Trimalleolar fracture of ankle, closed, right, initial encounter    Past Surgical History:  Procedure Laterality Date  . ABDOMINAL HYSTERECTOMY    . CARPAL TUNNEL RELEASE Bilateral 2013  . CHOLECYSTECTOMY    . ORIF ANKLE FRACTURE Right 06/14/2020   Procedure: Open treatment right ankle trimalleolar fracture with internal fixation;  Surgeon: Toni Arthurs, MD;  Location: [facility];  Service: Orthopedics;   Laterality: Right;    Family History  Problem Relation Age of Onset  . Hypertension Mother   . Hyperlipidemia Mother   . Hypertension Father   . Hypertension Sister   . Hypertension Son   . Hyperlipidemia Son   . Hypertension Sister   . Hypertension Sister    Social History   Socioeconomic History  . Marital status: Married    Spouse name: Catherine Mayer  . Number of children: 1  . Years of education: Not on file  . Highest education level: Associate degree: occupational, Scientist, product/process development, or vocational program  Occupational History  . 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 Smoking status: Never Smoker  . Smokeless tobacco: Never Used  Vaping Use  . Vaping Use: Never used  Substance and Sexual Activity  . Alcohol use: Not Currently  . Drug use: Never  . Sexual activity: Not on file  Other Topics Concern  . Not on file  Social History Narrative   Enjoys playing with dogs and being outside.    Social Determinants of Health   Financial Resource Strain: Low Risk   . Difficulty of Paying Living Expenses: Not hard at all  Food Insecurity: No Food Insecurity  . Worried About Programme researcher, broadcasting/film/video in the Last Year: Never true  . Ran Out of Food in the Last Year: Never true  Transportation Needs: No Transportation Needs  . Lack of Transportation (Medical): No  . Lack of Transportation (Non-Medical): No  Physical Activity: Inactive  . Days of Exercise per Week: 0 days  . Minutes of Exercise per Session: 0 min  Stress: No Stress Concern Present  . Feeling of Stress : Only a little  Social Connections: Moderately Integrated  . Frequency of Communication with Friends and Family: More than three times a week  . Frequency of Social Gatherings with Friends and Family: More than three times a week  . Attends Religious Services: 1 to 4 times per year  . Active Member of Clubs or Organizations: No  . Attends Banker Meetings: Never  . Marital  Status: Married    Activities of Daily Living In your present state of health, do you have any difficulty performing the following activities: 08/29/2020 06/14/2020  Hearing? N N  Vision? Y Y  Comment glasses -  Difficulty concentrating or making decisions? N N  Walking or climbing stairs? Y N  Comment due to broken foot -  Dressing or bathing? N N  Doing errands, shopping? N -  Preparing Food and eating ? N -  Using the Toilet? N -  In the past six months, have you accidently leaked urine? N -  Do you have problems with loss of bowel control? N -  Managing your Medications? N -  Managing your Finances? N -  Housekeeping or managing your Housekeeping? N -  Some recent data might be hidden    Patient Education/ Literacy How often do you need to have someone help you when  you read instructions, pamphlets, or other written materials from your doctor or pharmacy?: 1 - Never What is the last grade level you completed in school?: 12  Exercise Current Exercise Habits: The patient does not participate in regular exercise at present, Exercise limited by: Other - see comments (right broken foot)  Diet Patient reports consuming 3 meals a day and 2 snack(s) a day Patient reports that her primary diet is: Regular Patient reports that she does have regular access to food.   Depression Screen PHQ 2/9 Scores 08/29/2020 08/14/2020 08/14/2020 01/17/2020 11/21/2019 10/04/2019 08/29/2019  PHQ - 2 Score 0 1 0 0 0 0 0  PHQ- 9 Score - 3 - 5 0 0 -     Fall Risk Fall Risk  08/29/2020 08/14/2020 08/14/2020 01/17/2020 11/21/2019  Falls in the past year? 1 1 0 1 0  Number falls in past yr: 0 0 - 0 -  Injury with Fall? 1 1 - 1 -  Comment - - - - -  Risk for fall due to : History of fall(s) No Fall Risks - History of fall(s) -  Follow up - Falls evaluation completed - Falls prevention discussed -     Objective:  Catherine Mayer seemed alert and oriented and she participated appropriately during our telephone  visit.  Blood Pressure Weight BMI  BP Readings from Last 3 Encounters:  08/14/20 128/82  06/14/20 (!) 158/86  06/10/20 (!) 185/87   Wt Readings from Last 3 Encounters:  08/14/20 158 lb 12.8 oz (72 kg)  06/14/20 156 lb 4.9 oz (70.9 kg)  06/10/20 164 lb 0.4 oz (74.4 kg)   BMI Readings from Last 1 Encounters:  08/14/20 30.00 kg/Mayer    *Unable to obtain current vital signs, weight, and BMI due to telephone visit type  Hearing/Vision  . Josefina did  seem to have difficulty with hearing/understanding during the telephone conversation . Reports that she has had a formal eye exam by an eye care professional within the past year . Reports that she has not had a formal hearing evaluation within the past year *Unable to fully assess hearing and vision during telephone visit type  Cognitive Function: 6CIT Screen 08/29/2020 08/29/2019  What Year? 0 points 0 points  What month? 0 points 0 points  What time? 0 points 0 points  Count back from 20 0 points 0 points  Months in reverse 0 points 0 points  Repeat phrase 0 points 0 points  Total Score 0 0   (Normal:0-7, Significant for Dysfunction: >8)  Normal Cognitive Function Screening: Yes   Immunization & Health Maintenance Record Immunization History  Administered Date(s) Administered  . Influenza, High Dose Seasonal PF 07/22/2016, 03/18/2017, 03/22/2019  . Influenza,inj,quad, With Preservative 04/06/2017  . Moderna Sars-Covid-2 Vaccination 08/31/2019, 09/28/2019  . Pneumococcal Conjugate-13 07/22/2016  . Pneumococcal Polysaccharide-23 05/18/2019  . Tdap 02/13/2015    Health Maintenance  Topic Date Due  . COLONOSCOPY (Pts 45-19yrs Insurance coverage will need to be confirmed)  Never done  . MAMMOGRAM  Never done  . INFLUENZA VACCINE  02/05/2020  . COVID-19 Vaccine (3 - Booster for Moderna series) 03/30/2020  . TETANUS/TDAP  02/12/2025  . DEXA SCAN  Completed  . Hepatitis C Screening  Completed  . PNA vac Low Risk Adult  Completed        Assessment  This is a routine wellness examination for Catherine Mayer.  Health Maintenance: Due or Overdue Health Maintenance Due  Topic Date Due  . COLONOSCOPY (Pts 45-104yrs  Insurance coverage will need to be confirmed)  Never done  . MAMMOGRAM  Never done  . INFLUENZA VACCINE  02/05/2020  . COVID-19 Vaccine (3 - Booster for Moderna series) 03/30/2020    Catherine Mayer does not need a referral for Community Assistance: Care Management:   no Social Work:    no Prescription Assistance:  no Nutrition/Diabetes Education:  no   Plan:  Personalized Goals Goals Addressed            This Visit's Progress   . DIET - INCREASE WATER INTAKE      . Patient Stated   Not on track    08/29/2019 AWV Goal: Fall Prevention  . Over the next year, patient will decrease their risk for falls by: o Using assistive devices, such as a cane or walker, as needed o Identifying fall risks within their home and correcting them by: - Removing throw rugs - Adding handrails to stairs or ramps - Removing clutter and keeping a clear pathway throughout the home - Increasing light, especially at night - Adding shower handles/bars - Raising toilet seat o Identifying potential personal risk factors for falls: - Medication side effects - Incontinence/urgency - Vestibular dysfunction - Hearing loss - Musculoskeletal disorders - Neurological disorders - Orthostatic hypotension  08/29/2019 AWV Goal: Exercise for General Health   Patient will verbalize understanding of the benefits of increased physical activity:  Exercising regularly is important. It will improve your overall fitness, flexibility, and endurance.  Regular exercise also will improve your overall health. It can help you control your weight, reduce stress, and improve your bone density.  Over the next year, patient will increase physical activity as tolerated with a goal of at least 150 minutes of moderate physical activity per week.    You can tell that you are exercising at a moderate intensity if your heart starts beating faster and you start breathing faster but can still hold a conversation.  Moderate-intensity exercise ideas include:  Walking 1 mile (1.6 km) in about 15 minutes  Biking  Hiking  Golfing  Dancing  Water aerobics  Patient will verbalize understanding of everyday activities that increase physical activity by providing examples like the following: ? Yard work, such as: ? Pushing a Surveyor, mining ? Raking and bagging leaves ? Washing your car ? Pushing a stroller ? Shoveling snow ? Gardening ? Washing windows or floors  Patient will be able to explain general safety guidelines for exercising:   Before you start a new exercise program, talk with your health care provider.  Do not exercise so much that you hurt yourself, feel dizzy, or get very short of breath.  Wear comfortable clothes and wear shoes with good support.  Drink plenty of water while you exercise to prevent dehydration or heat stroke.  Work out until your breathing and your heartbeat get faster.       Personalized Health Maintenance & Screening Recommendations  Screening mammography  Shingles vaccine Lung Cancer Screening Recommended: no (Low Dose CT Chest recommended if Age 11-80 years, 30 pack-year currently smoking OR have quit w/in past 15 years) Hepatitis C Screening recommended: no HIV Screening recommended: no  Advanced Directives: Written information was not prepared per patient's request.  Referrals & Orders No orders of the defined types were placed in this encounter.   Follow-up Plan . Follow-up with Catherine Ip, DO as planned on 02/11/21 . Pt is due for mammogram this year. She is aware and will schedule. . Pt did  FOBT test in 2021 and it was negative, she does not believe she needs a colonoscopy at this time. . She is scheduled for Covid booster on 09/12/20. Marland Kitchen Declined flu shot. . Pt is  independent with all ADL's, she broke her right food in Dec 2021 and is still in a boot. Marland Kitchen She will begin exercising when foot is healed. . Pt declines any information on advanced directives. Marland Kitchen Hearing and vision is good. . Pt voices no healthcare concerns at this time. . AVS printed and mailed to pt.    I have personally reviewed and noted the following in the patient's chart:   . Medical and social history . Use of alcohol, tobacco or illicit drugs  . Current medications and supplements . Functional ability and status . Nutritional status . Physical activity . Advanced directives . List of other physicians . Hospitalizations, surgeries, and ER visits in previous 12 months . Vitals . Screenings to include cognitive, depression, and falls . Referrals and appointments  In addition, I have reviewed and discussed with Catherine Mayer certain preventive protocols, quality metrics, and best practice recommendations. A written personalized care plan for preventive services as well as general preventive health recommendations is available and can be mailed to the patient at her request.      Lori-Tanesha, [place], LPN  [id number]

## 2020-09-05 ENCOUNTER — Encounter: Payer: Self-pay | Admitting: Physical Therapy

## 2020-09-05 ENCOUNTER — Other Ambulatory Visit: Payer: Self-pay

## 2020-09-05 ENCOUNTER — Ambulatory Visit: Payer: Medicare Other | Attending: Orthopedic Surgery | Admitting: Physical Therapy

## 2020-09-05 DIAGNOSIS — R600 Localized edema: Secondary | ICD-10-CM | POA: Insufficient documentation

## 2020-09-05 DIAGNOSIS — M25571 Pain in right ankle and joints of right foot: Secondary | ICD-10-CM | POA: Diagnosis not present

## 2020-09-05 DIAGNOSIS — M25671 Stiffness of right ankle, not elsewhere classified: Secondary | ICD-10-CM | POA: Insufficient documentation

## 2020-09-05 DIAGNOSIS — M6281 Muscle weakness (generalized): Secondary | ICD-10-CM | POA: Diagnosis not present

## 2020-09-05 NOTE — Therapy (Signed)
[facility] [address] [place], [place], [address] Phone: [phone]   Fax:  [phone]  Physical Therapy Evaluation  Patient Details  Name: Catherine Mayer MRN: [id number] Date of Birth: Sep 17, 1950 Referring Provider (PT): Toni Arthurs, MD   Encounter Date: 09/05/2020   PT End of Session - 09/05/20 1403    Visit Number 1    Number of Visits 12    Date for PT Re-Evaluation 10/24/20    Authorization Type [employer]; FOTO; Progress note every 10th visit    PT Start Time 1030    PT Stop Time 1116    PT Time Calculation (min) 46 min    Equipment Utilized During Treatment Other (comment)   CAM boot   Activity Tolerance Patient tolerated treatment well    Behavior During Therapy [facility] for tasks assessed/performed           Past Medical History:  Diagnosis Date  . Anxiety   . Arthritis    hands, knees  . Depression   . GERD (gastroesophageal reflux disease)   . Hyperlipidemia   . Hypertension   . Hypothyroidism   . Thyroid disease   . Trimalleolar fracture of ankle, closed, right, initial encounter     Past Surgical History:  Procedure Laterality Date  . ABDOMINAL HYSTERECTOMY    . CARPAL TUNNEL RELEASE Bilateral 2013  . CHOLECYSTECTOMY    . ORIF ANKLE FRACTURE Right 06/14/2020   Procedure: Open treatment right ankle trimalleolar fracture with internal fixation;  Surgeon: Toni Arthurs, MD;  Location: [facility];  Service: Orthopedics;  Laterality: Right;     There were no vitals filed for this visit.    Subjective Assessment - 09/05/20 1335    Subjective COVID-19 screening performed upon arrival. Patient arrives s/p right ankle ORIF on 06/14/2020 secondary to a closed trimalleolar fracture sustained on 06/10/2020. Patient reported tripping over a root when she fell. Patient has been in the boot for approximately 5 weeks and has been able to get out of the boot with ambulation in the home. Patient is  required to wear boot for community ambulation. Patient report ability to perform all ADLs and home activities with minimal pain. Patient reports walking tolerance is about 1-2 hrs with the boot. Patient reports mild pain at worst and 0/10 at best. Patient's goals are to improve movement, improve strength, improve ability to perform work activities and return to walking program for exercise.    Pertinent History R trimalleolar ORIF 06/14/2020; HTN    Limitations Standing;Walking;House hold activities    Patient Stated Goals return to walking program    Currently in Pain? No/denies              [facility] PT Assessment - 09/05/20 0001      Assessment   Medical Diagnosis displaced trimalleolar fracture of right lower leg, initial encounter for closed fracture    Referring Provider (PT) Toni Arthurs, MD    Onset Date/Surgical Date 06/14/20    Next MD Visit "end of the month"    Prior Therapy no      Precautions   Precautions Other (comment)    Precaution Comments fall; CAM boot for outdoor amulation, regular shoe for indoor ambulation      Restrictions   Weight Bearing Restrictions No      Balance Screen   Has the patient fallen in the past 6 months Yes    How many times? 1    Has the patient had  a decrease in activity level because of a fear of falling?  No    Is the patient reluctant to leave their home because of a fear of falling?  No      Home Environment   Living Environment Private residence    Living Arrangements Spouse/significant other    Type of Home House    Home Access Stairs to enter    Entrance Stairs-Number of Steps 2      Prior Function   Level of Independence Independent with basic ADLs      Observation/Other Assessments   Focus on Therapeutic Outcomes (FOTO)  completed      Observation/Other Assessments-Edema    Edema Figure 8      Figure 8 Edema   Figure 8 - Right  53.4    Figure 8 - Left  48.5      ROM / Strength   AROM / PROM / Strength PROM;AROM       AROM   Overall AROM  Deficits    AROM Assessment Site Ankle    Right/Left Ankle Right    Right Ankle Dorsiflexion 0   4   Right Ankle Plantar Flexion 36    Right Ankle Inversion 26    Right Ankle Eversion 14      PROM   Overall PROM  Within functional limits for tasks performed    PROM Assessment Site Ankle    Right/Left Ankle Right    Right Ankle Dorsiflexion 4   10   Right Ankle Plantar Flexion 52    Right Ankle Inversion 28    Right Ankle Eversion 14      Palpation   Palpation comment notable scar tissue under incision lines otherwise minimal tenderness upon palpation      Transfers   Five time sit to stand comments  10.9 seconds without UE support      Ambulation/Gait   Assistive device None   CAM boot   Gait Pattern Step-through pattern;Decreased stance time - right;Decreased step length - left;Decreased dorsiflexion - left;Decreased hip/knee flexion - left;Right foot flat                      Objective measurements completed on examination: See above findings.       [facility] Adult PT Treatment/Exercise - 09/05/20 0001      Modalities   Modalities Vasopneumatic      Vasopneumatic   Number Minutes Vasopneumatic  10 minutes    Vasopnuematic Location  Ankle    Vasopneumatic Pressure Low    Vasopneumatic Temperature  34                  PT Education - 09/05/20 1344    Education Details gastroc, soleus stretch, ankle ABCs, icing with elevation    Person(s) Educated Patient    Methods Explanation;Handout    Comprehension Verbalized understanding               PT Long Term Goals - 09/05/20 1549      PT LONG TERM GOAL #1   Title Patient will be independent with HEP and progression    Time 6    Period Weeks    Status New      PT LONG TERM GOAL #2   Title Patient will demonstrate 8+ degrees of right ankle DF to improve gait mechanics.    Time 6    Period Weeks    Status New      PT  LONG TERM GOAL #3   Title Patient will  demonstrate 4+/5 or greater right ankle MMT in all planes to improve stability during functional tasks.    Time 6    Period Weeks    Status New      PT LONG TERM GOAL #4   Title Patient will ambulate community distances with right ankle pain less than or equal to 3/10 for work activities and walking program.    Time 6    Period Weeks    Status New      PT LONG TERM GOAL #5   Title Patient will report ability to perform all ADLs with right ankle pain less than or equal to 2/10.    Time 6    Period Weeks    Status New                  Plan - 09/05/20 1544    Clinical Impression Statement Patient is a 70 year old female who presents to physical therapy with right foot/ankle pain, numbness, decreased ROM, and decreased MMT secondary to a trimalleolar ORIF on 06/14/2020. Patient ambulates with a CAM boot out in the community but without it inside her home. Patient noted with increased localized edema. Patient's incision are well healed with palpable scar tissue under each scar. Patient reports numbness on dorsum of the foot with notable skin discoloration. Patient and PT discussed POC and HEP to which patient reported understanding. Patient would benefit from skilled physical therapy to address deficits and address patient's goals.    Personal Factors and Comorbidities Age;Comorbidity 2    Comorbidities R Trimalleolar ORIF 06/14/2020; HTN    Examination-Activity Limitations Locomotion Level    Examination-Participation Restrictions Occupation    Stability/Clinical Decision Making Stable/Uncomplicated    Clinical Decision Making Low    Rehab Potential Excellent    PT Frequency 2x / week    PT Duration 6 weeks    PT Treatment/Interventions ADLs/Self Care Home Management;Cryotherapy;Electrical Stimulation;Iontophoresis 4mg /ml Dexamethasone;Moist Heat;Gait training;Stair training;Functional mobility training;Therapeutic activities;Therapeutic exercise;Balance training;Neuromuscular  re-education;Manual techniques;Patient/family education;Passive range of motion;Vasopneumatic Device;Taping    PT Next Visit Plan can remove boot during PT but must wear for outdoor ambulation; nustep, ankle ROM and strengthening, progress to proprioception and balance, modalities PRN for pain relief    PT Home Exercise Plan see patient education section    Consulted and Agree with Plan of Care Patient           Patient will benefit from skilled therapeutic intervention in order to improve the following deficits and impairments:  Decreased range of motion,Difficulty walking,Decreased activity tolerance,Decreased balance,Decreased strength,Increased edema,Pain  Visit Diagnosis: Stiffness of right ankle, not elsewhere classified - Plan: PT plan of care cert/re-cert  Pain in right ankle and joints of right foot - Plan: PT plan of care cert/re-cert  Muscle weakness (generalized) - Plan: PT plan of care cert/re-cert  Localized edema - Plan: PT plan of care cert/re-cert     Problem List Patient Active Problem List   Diagnosis Date Noted  . Generalized anxiety disorder with panic attacks 05/18/2019  . Panic disorder 09/13/2018  . Anxiety, generalized 09/13/2018  . Essential hypertension with goal blood pressure less than 130/80 07/22/2016  . Hyperlipidemia 07/22/2016  . Recurrent major depressive disorder, in full remission (HCC) 07/22/2016  . Vitamin D deficiency 07/22/2016  . Choking 12/29/2013  . Hypothyroidism 12/19/2013    12/21/2013, PT, DPT 09/05/2020, 3:59 PM  [facility] [address]  [place], [place], [address] Phone: [phone]   Fax:  [phone]  Name: Catherine Mayer MRN: [id number] Date of Birth: [birth year]

## 2020-09-10 ENCOUNTER — Other Ambulatory Visit: Payer: Self-pay

## 2020-09-10 ENCOUNTER — Ambulatory Visit: Payer: Medicare Other | Admitting: Physical Therapy

## 2020-09-10 ENCOUNTER — Encounter: Payer: Self-pay | Admitting: Physical Therapy

## 2020-09-10 DIAGNOSIS — M25571 Pain in right ankle and joints of right foot: Secondary | ICD-10-CM | POA: Diagnosis not present

## 2020-09-10 DIAGNOSIS — M6281 Muscle weakness (generalized): Secondary | ICD-10-CM | POA: Diagnosis not present

## 2020-09-10 DIAGNOSIS — M25671 Stiffness of right ankle, not elsewhere classified: Secondary | ICD-10-CM | POA: Diagnosis not present

## 2020-09-10 DIAGNOSIS — R600 Localized edema: Secondary | ICD-10-CM | POA: Diagnosis not present

## 2020-09-10 NOTE — Therapy (Signed)
[facility] [address] [place], [place], [address] Phone: [phone]   Fax:  [phone]  Physical Therapy Treatment  Patient Details  Name: Catherine Mayer MRN: [id number] Date of Birth: 05/31/1951 Referring Provider (PT): Toni Arthurs, MD   Encounter Date: 09/10/2020   PT End of Session - 09/10/20 0856    Visit Number 2    Number of Visits 12    Date for PT Re-Evaluation 10/24/20    Authorization Type [employer]; FOTO; Progress note every 10th visit    PT Start Time 0816    PT Stop Time 0905    PT Time Calculation (min) 49 min    Activity Tolerance Patient tolerated treatment well    Behavior During Therapy [facility] for tasks assessed/performed           Past Medical History:  Diagnosis Date  . Anxiety   . Arthritis    hands, knees  . Depression   . GERD (gastroesophageal reflux disease)   . Hyperlipidemia   . Hypertension   . Hypothyroidism   . Thyroid disease   . Trimalleolar fracture of ankle, closed, right, initial encounter     Past Surgical History:  Procedure Laterality Date  . ABDOMINAL HYSTERECTOMY    . CARPAL TUNNEL RELEASE Bilateral 2013  . CHOLECYSTECTOMY    . ORIF ANKLE FRACTURE Right 06/14/2020   Procedure: Open treatment right ankle trimalleolar fracture with internal fixation;  Surgeon: Toni Arthurs, MD;  Location: [facility];  Service: Orthopedics;  Laterality: Right;     There were no vitals filed for this visit.   Subjective Assessment - 09/10/20 0821    Subjective COVID 19 screening performed on patient upon arrival. Patient reports she was up a lot on her feet yesterday. Wore her normal shoes to PT today.    Pertinent History R trimalleolar ORIF 06/14/2020; HTN    Limitations Standing;Walking;House hold activities    Patient Stated Goals return to walking program    Currently in Pain? Yes    Pain Score 3     Pain Location Ankle    Pain Orientation Right    Pain Descriptors  / Indicators Sore;Aching    Pain Type Surgical pain    Pain Onset More than a month ago    Pain Frequency Intermittent              [facility] PT Assessment - 09/10/20 0001      Assessment   Medical Diagnosis displaced trimalleolar fracture of right lower leg, initial encounter for closed fracture    Referring Provider (PT) Toni Arthurs, MD    Onset Date/Surgical Date 06/14/20    Next MD Visit "end of the month"    Prior Therapy no      Precautions   Precautions Other (comment)    Precaution Comments fall; CAM boot for outdoor amulation, regular shoe for indoor ambulation      Restrictions   Weight Bearing Restrictions No                         [facility] Adult PT Treatment/Exercise - 09/10/20 0001      Exercises   Exercises Ankle      Modalities   Modalities Vasopneumatic      Vasopneumatic   Number Minutes Vasopneumatic  10 minutes    Vasopnuematic Location  Ankle    Vasopneumatic Pressure Low    Vasopneumatic Temperature  34  Ankle Exercises: Aerobic   Nustep L4 x10 min LEs only      Ankle Exercises: Standing   Rocker Board 4 minutes    Heel Raises Both;20 reps;10 reps    Heel Raises Limitations 3D    Toe Raise 20 reps;10 reps    Other Standing Ankle Exercises R forward step 6" step x20 rpes    Other Standing Ankle Exercises R forward lunge x20 reps      Ankle Exercises: Seated   Other Seated Ankle Exercises 4D ankle isolator 0.5# x30 reps each                       PT Long Term Goals - 09/05/20 1549      PT LONG TERM GOAL #1   Title Patient will be independent with HEP and progression    Time 6    Period Weeks    Status New      PT LONG TERM GOAL #2   Title Patient will demonstrate 8+ degrees of right ankle DF to improve gait mechanics.    Time 6    Period Weeks    Status New      PT LONG TERM GOAL #3   Title Patient will demonstrate 4+/5 or greater right ankle MMT in all planes to improve stability during functional  tasks.    Time 6    Period Weeks    Status New      PT LONG TERM GOAL #4   Title Patient will ambulate community distances with right ankle pain less than or equal to 3/10 for work activities and walking program.    Time 6    Period Weeks    Status New      PT LONG TERM GOAL #5   Title Patient will report ability to perform all ADLs with right ankle pain less than or equal to 2/10.    Time 6    Period Weeks    Status New                 Plan - 09/10/20 1158    Clinical Impression Statement Patient presented in clinic with minimal discomfort of R ankle. Patient reports being more active yesterday and trying to return to normal activities. Patient progressed through light strengtheing and stretching of R ankle as well. No complaints of pain throughout therex. Normal vasopneumatic response noted following removal of the modality.    Personal Factors and Comorbidities Age;Comorbidity 2    Comorbidities R Trimalleolar ORIF 06/14/2020; HTN    Examination-Activity Limitations Locomotion Level    Examination-Participation Restrictions Occupation    Stability/Clinical Decision Making Stable/Uncomplicated    Rehab Potential Excellent    PT Frequency 2x / week    PT Duration 6 weeks    PT Treatment/Interventions ADLs/Self Care Home Management;Cryotherapy;Electrical Stimulation;Iontophoresis 4mg /ml Dexamethasone;Moist Heat;Gait training;Stair training;Functional mobility training;Therapeutic activities;Therapeutic exercise;Balance training;Neuromuscular re-education;Manual techniques;Patient/family education;Passive range of motion;Vasopneumatic Device;Taping    PT Next Visit Plan can remove boot during PT but must wear for outdoor ambulation; nustep, ankle ROM and strengthening, progress to proprioception and balance, modalities PRN for pain relief    PT Home Exercise Plan see patient education section    Consulted and Agree with Plan of Care Patient           Patient will benefit  from skilled therapeutic intervention in order to improve the following deficits and impairments:  Decreased range of motion,Difficulty walking,Decreased activity tolerance,Decreased balance,Decreased strength,Increased edema,Pain  Visit  Diagnosis: Stiffness of right ankle, not elsewhere classified  Pain in right ankle and joints of right foot  Muscle weakness (generalized)  Localized edema     Problem List Patient Active Problem List   Diagnosis Date Noted  . Generalized anxiety disorder with panic attacks 05/18/2019  . Panic disorder 09/13/2018  . Anxiety, generalized 09/13/2018  . Essential hypertension with goal blood pressure less than 130/80 07/22/2016  . Hyperlipidemia 07/22/2016  . Recurrent major depressive disorder, in full remission (HCC) 07/22/2016  . Vitamin D deficiency 07/22/2016  . Choking 12/29/2013  . Hypothyroidism 12/19/2013    Marvell Fuller, PTA 09/10/2020, 12:03 PM  [facility] [address] [place], [place], [address] Phone: [phone]   Fax:  [phone]  Name: Pilar Westergaard MRN: [id number] Date of Birth: March 26, 1951

## 2020-09-12 ENCOUNTER — Ambulatory Visit (INDEPENDENT_AMBULATORY_CARE_PROVIDER_SITE_OTHER): Payer: Medicare Other

## 2020-09-12 ENCOUNTER — Ambulatory Visit: Payer: Medicare Other

## 2020-09-12 ENCOUNTER — Other Ambulatory Visit: Payer: Self-pay

## 2020-09-12 DIAGNOSIS — Z23 Encounter for immunization: Secondary | ICD-10-CM

## 2020-09-12 NOTE — Progress Notes (Signed)
   Covid-19 Vaccination Clinic  Name:  Royelle Hinchman    MRN: [id number] DOB: [birth year]  09/12/2020  Ms. Plake was observed post Covid-19 immunization for 15 minutes without incident. She was provided with Vaccine Information Sheet and instruction to access the V-Safe system.   Ms. Branan was instructed to call 911 with any severe reactions post vaccine: Marland Kitchen Difficulty breathing  . Swelling of face and throat  . A fast heartbeat  . A bad rash all over body  . Dizziness and weakness   Immunizations Administered    Name Date Dose VIS Date Route   Moderna Covid-19 Booster Vaccine 09/12/2020 10:04 AM 0.25 mL 04/25/2020 Intramuscular   Manufacturer: Moderna   Lot: [id number]   NDC: 78676-720-94

## 2020-09-13 ENCOUNTER — Ambulatory Visit: Payer: Medicare Other | Admitting: Physical Therapy

## 2020-09-13 ENCOUNTER — Encounter: Payer: Self-pay | Admitting: Physical Therapy

## 2020-09-13 ENCOUNTER — Other Ambulatory Visit: Payer: Self-pay

## 2020-09-13 DIAGNOSIS — M6281 Muscle weakness (generalized): Secondary | ICD-10-CM

## 2020-09-13 DIAGNOSIS — M25671 Stiffness of right ankle, not elsewhere classified: Secondary | ICD-10-CM | POA: Diagnosis not present

## 2020-09-13 DIAGNOSIS — M25571 Pain in right ankle and joints of right foot: Secondary | ICD-10-CM

## 2020-09-13 DIAGNOSIS — R600 Localized edema: Secondary | ICD-10-CM

## 2020-09-13 NOTE — Therapy (Addendum)
[facility] [address] [place], [place], [address] Phone: [phone]   Fax:  [phone]  Physical Therapy Treatment  Patient Details  Name: Catherine Mayer MRN: [id number] Date of Birth: 1950-07-16 Referring Provider (PT): Toni Arthurs, MD   Encounter Date: 09/13/2020   PT End of Session - 09/13/20 0853    Visit Number 3    Number of Visits 12    Date for PT Re-Evaluation 10/24/20    Authorization Type [employer]; Janyth Contes; Progress note every 10th visit    PT Start Time [phone]    PT Stop Time 0900    PT Time Calculation (min) 46 min    Activity Tolerance Patient tolerated treatment well    Behavior During Therapy [facility] for tasks assessed/performed           Past Medical History:  Diagnosis Date  . Anxiety   . Arthritis    hands, knees  . Depression   . GERD (gastroesophageal reflux disease)   . Hyperlipidemia   . Hypertension   . Hypothyroidism   . Thyroid disease   . Trimalleolar fracture of ankle, closed, right, initial encounter     Past Surgical History:  Procedure Laterality Date  . ABDOMINAL HYSTERECTOMY    . CARPAL TUNNEL RELEASE Bilateral 2013  . CHOLECYSTECTOMY    . ORIF ANKLE FRACTURE Right 06/14/2020   Procedure: Open treatment right ankle trimalleolar fracture with internal fixation;  Surgeon: Toni Arthurs, MD;  Location: [facility];  Service: Orthopedics;  Laterality: Right;     There were no vitals filed for this visit.   Subjective Assessment - 09/13/20 0823    Subjective COVID 19 screening performed on patient upon arrival. Patient arrives stating she walked a lot yesterday but feels like she's already made good improvements in ROM. No pain, just soreness.    Pertinent History R trimalleolar ORIF 06/14/2020; HTN    Limitations Standing;Walking;House hold activities    Patient Stated Goals return to walking program    Currently in Pain? Yes    Pain Score 1     Pain Location Ankle     Pain Orientation Right    Pain Descriptors / Indicators Sore    Pain Type Surgical pain    Pain Onset More than a month ago    Pain Frequency Intermittent              [facility] PT Assessment - 09/13/20 0001      Assessment   Medical Diagnosis displaced trimalleolar fracture of right lower leg, initial encounter for closed fracture    Referring Provider (PT) Toni Arthurs, MD    Onset Date/Surgical Date 06/14/20    Next MD Visit 09/24/2020    Prior Therapy no      Precautions   Precautions Other (comment)    Precaution Comments fall; CAM boot for outdoor amulation, regular shoe for indoor ambulation      Restrictions   Weight Bearing Restrictions No                         [facility] Adult PT Treatment/Exercise - 09/13/20 0001      Modalities   Modalities Vasopneumatic      Vasopneumatic   Number Minutes Vasopneumatic  10 minutes    Vasopnuematic Location  Ankle    Vasopneumatic Pressure Low    Vasopneumatic Temperature  34      Ankle Exercises: Aerobic   Nustep L4 x10 min  LEs only      Ankle Exercises: Standing   Rocker Board 4 minutes    Heel Raises Both;20 reps    Heel Raises Limitations 3D    Toe Raise 20 reps    Other Standing Ankle Exercises R dynadisc circles x20 CW, CCW      Ankle Exercises: Seated   Other Seated Ankle Exercises ankle circles CW, CCW 0.5# x30 reps each    Other Seated Ankle Exercises 4 way ankle red theraband 2x10 each                       PT Long Term Goals - 09/05/20 1549      PT LONG TERM GOAL #1   Title Patient will be independent with HEP and progression    Time 6    Period Weeks    Status New      PT LONG TERM GOAL #2   Title Patient will demonstrate 8+ degrees of right ankle DF to improve gait mechanics.    Time 6    Period Weeks    Status New      PT LONG TERM GOAL #3   Title Patient will demonstrate 4+/5 or greater right ankle MMT in all planes to improve stability during functional tasks.     Time 6    Period Weeks    Status New      PT LONG TERM GOAL #4   Title Patient will ambulate community distances with right ankle pain less than or equal to 3/10 for work activities and walking program.    Time 6    Period Weeks    Status New      PT LONG TERM GOAL #5   Title Patient will report ability to perform all ADLs with right ankle pain less than or equal to 2/10.    Time 6    Period Weeks    Status New                 Plan - 09/13/20 2952    Clinical Impression Statement Patient arrives with mild sorness in R ankle but overall doing well. Patient guided through TEs with good response but with multiple cues to slow pace down for more controlled movements. Patient has some difficulties with fluidity of ankle cirlcles but was able to complete with no complaints. No adverse affects upon removal of vasopneumatic device.    Personal Factors and Comorbidities Age;Comorbidity 2    Comorbidities R Trimalleolar ORIF 06/14/2020; HTN    Examination-Activity Limitations Locomotion Level    Examination-Participation Restrictions Occupation    Stability/Clinical Decision Making Stable/Uncomplicated    Clinical Decision Making Low    Rehab Potential Excellent    PT Frequency 2x / week    PT Duration 6 weeks    PT Treatment/Interventions ADLs/Self Care Home Management;Cryotherapy;Electrical Stimulation;Iontophoresis 4mg /ml Dexamethasone;Moist Heat;Gait training;Stair training;Functional mobility training;Therapeutic activities;Therapeutic exercise;Balance training;Neuromuscular re-education;Manual techniques;Patient/family education;Passive range of motion;Vasopneumatic Device;Taping    PT Next Visit Plan can remove boot during PT but must wear for outdoor ambulation; nustep, ankle ROM and strengthening, progress to proprioception and balance, modalities PRN for pain relief    PT Home Exercise Plan see patient education section    Consulted and Agree with Plan of Care Patient            Patient will benefit from skilled therapeutic intervention in order to improve the following deficits and impairments:  Decreased range of motion,Difficulty walking,Decreased activity tolerance,Decreased balance,Decreased  strength,Increased edema,Pain  Visit Diagnosis: Stiffness of right ankle, not elsewhere classified  Pain in right ankle and joints of right foot  Muscle weakness (generalized)  Localized edema     Problem List Patient Active Problem List   Diagnosis Date Noted  . Generalized anxiety disorder with panic attacks 05/18/2019  . Panic disorder 09/13/2018  . Anxiety, generalized 09/13/2018  . Essential hypertension with goal blood pressure less than 130/80 07/22/2016  . Hyperlipidemia 07/22/2016  . Recurrent major depressive disorder, in full remission (HCC) 07/22/2016  . Vitamin D deficiency 07/22/2016  . Choking 12/29/2013  . Hypothyroidism 12/19/2013    Guss Bunde, PT, DPT 09/13/2020, 9:11 AM  [facility] [address] [place], [place], [address] Phone: [phone]   Fax:  [phone]  Name: Catherine Mayer MRN: [id number] Date of Birth: 17-Jul-1950

## 2020-09-18 ENCOUNTER — Ambulatory Visit: Payer: Medicare Other | Admitting: Physical Therapy

## 2020-09-20 ENCOUNTER — Encounter: Payer: Medicare Other | Admitting: Physical Therapy

## 2020-09-24 DIAGNOSIS — S82851A Displaced trimalleolar fracture of right lower leg, initial encounter for closed fracture: Secondary | ICD-10-CM | POA: Diagnosis not present

## 2020-09-24 DIAGNOSIS — M25471 Effusion, right ankle: Secondary | ICD-10-CM | POA: Diagnosis not present

## 2020-10-03 ENCOUNTER — Other Ambulatory Visit: Payer: Self-pay | Admitting: Family Medicine

## 2020-10-03 DIAGNOSIS — I10 Essential (primary) hypertension: Secondary | ICD-10-CM

## 2020-10-24 DIAGNOSIS — S82851A Displaced trimalleolar fracture of right lower leg, initial encounter for closed fracture: Secondary | ICD-10-CM | POA: Diagnosis not present

## 2020-10-24 DIAGNOSIS — M25471 Effusion, right ankle: Secondary | ICD-10-CM | POA: Diagnosis not present

## 2021-01-09 DIAGNOSIS — S82851A Displaced trimalleolar fracture of right lower leg, initial encounter for closed fracture: Secondary | ICD-10-CM | POA: Diagnosis not present

## 2021-01-17 ENCOUNTER — Telehealth: Payer: Self-pay | Admitting: Family Medicine

## 2021-01-18 ENCOUNTER — Ambulatory Visit (INDEPENDENT_AMBULATORY_CARE_PROVIDER_SITE_OTHER): Payer: Medicare Other | Admitting: Family Medicine

## 2021-01-18 DIAGNOSIS — F5101 Primary insomnia: Secondary | ICD-10-CM | POA: Diagnosis not present

## 2021-01-18 MED ORDER — ZOLPIDEM TARTRATE 5 MG PO TABS: 2.5000 mg | ORAL_TABLET | Freq: Every evening | ORAL | 0 refills | Status: DC | PRN

## 2021-01-18 NOTE — Progress Notes (Signed)
MyChart Video visit  Subjective: CC: insomnia PCP: [name], DO HPI:Catherine Mayer is a 70 y.o. female. Patient provides verbal consent for consult held via video.  Due to COVID-19 pandemic this visit was conducted virtually. This visit type was conducted due to national recommendations for restrictions regarding the COVID-19 Pandemic (e.g. social distancing, sheltering in place) in an effort to limit this patient's exposure and mitigate transmission in our community. All issues noted in this document were discussed and addressed.  A physical exam was not performed with this format.   Location of patient: work Location of provider: [facility] Others present for call: none  1.  Insomnia Patient reports that she has been struggling with insomnia pretty badly for the last several days.  She has been excessively sleepy during the daytime but not napping as a result of poor sleep at night.  She is tried using the Ativan but has not found it to be helpful to rest.  She was previously treated with Ambien I would like to switch off of the Ativan and on to Ambien for few days if possible to try and get some rest.  She would only use this if absolutely needed   ROS: Per HPI  No Known Allergies Past Medical History:  Diagnosis Date   Anxiety    Arthritis    hands, knees   Depression    GERD (gastroesophageal reflux disease)    Hyperlipidemia    Hypertension    Hypothyroidism    Thyroid disease    Trimalleolar fracture of ankle, closed, right, initial encounter     Current Outpatient Medications:    amLODipine (NORVASC) 5 MG tablet, TAKE 1 TABLET BY MOUTH  DAILY, Disp: 90 tablet, Rfl: 1   diclofenac Sodium (VOLTAREN) 1 % GEL, , Disp: , Rfl:    escitalopram (LEXAPRO) 20 MG tablet, Take 1 tablet (20 mg total) by mouth daily., Disp: 90 tablet, Rfl: 3   levothyroxine (SYNTHROID) 137 MCG tablet, TAKE 1 TABLET BY MOUTH  DAILY BEFORE BREAKFAST, Disp: 90 tablet, Rfl: 3   LORazepam (ATIVAN) 0.5 MG  tablet, Take 1 tablet (0.5 mg total) by mouth daily as needed for anxiety., Disp: 90 tablet, Rfl: 1   meloxicam (MOBIC) 15 MG tablet, Take 15 mg by mouth daily as needed for pain. (Patient not taking: Reported on 08/29/2020), Disp: , Rfl:    omeprazole (PRILOSEC) 20 MG capsule, Take 20 mg by mouth daily. , Disp: , Rfl:    pravastatin (PRAVACHOL) 80 MG tablet, TAKE 1 TABLET BY MOUTH  DAILY, Disp: 90 tablet, Rfl: 3   valsartan-hydrochlorothiazide (DIOVAN HCT) 160-25 MG tablet, Take 1 tablet by mouth daily. Replacing the Hyzaar, Disp: 90 tablet, Rfl: 1  Assessment/ Plan: 70 y.o. female   Primary insomnia  Will give small supply of Ambien to use as needed.  Discussed risks of the medication including falls, sleepwalking and even hallucinations.  It sounds like she is tolerating this without difficulty in the past.  Would recommend 2.5 mg to start as needed and then advance to 5 if needed.  She will see her back in office if needs refills on this going forward.  The Narcotic Database has been reviewed.  There were no red flags.     Start time: 1:16pm-1:20pm (telephone), then switched to video 3:04pm End time: 3:09pm  Total time spent on patient care (including video visit/ documentation): 9 minutes  Lloyd Cullinan Hulen Skains, DO [facility] [phone]

## 2021-02-03 ENCOUNTER — Other Ambulatory Visit: Payer: Self-pay | Admitting: Family Medicine

## 2021-02-06 ENCOUNTER — Other Ambulatory Visit: Payer: Self-pay | Admitting: Family Medicine

## 2021-02-06 DIAGNOSIS — R21 Rash and other nonspecific skin eruption: Secondary | ICD-10-CM

## 2021-02-07 ENCOUNTER — Other Ambulatory Visit: Payer: Self-pay | Admitting: *Deleted

## 2021-02-07 MED ORDER — ESCITALOPRAM OXALATE 20 MG PO TABS: 20.0000 mg | ORAL_TABLET | Freq: Every day | ORAL | 0 refills | Status: DC

## 2021-02-11 ENCOUNTER — Ambulatory Visit: Payer: Medicare Other | Admitting: Family Medicine

## 2021-02-26 ENCOUNTER — Other Ambulatory Visit: Payer: Self-pay

## 2021-02-26 ENCOUNTER — Encounter: Payer: Self-pay | Admitting: Family Medicine

## 2021-02-26 ENCOUNTER — Ambulatory Visit (INDEPENDENT_AMBULATORY_CARE_PROVIDER_SITE_OTHER): Payer: Medicare Other | Admitting: Family Medicine

## 2021-02-26 VITALS — BP 152/100 | HR 72 | Temp 97.8°F | Ht 61.0 in | Wt 161.0 lb

## 2021-02-26 DIAGNOSIS — E034 Atrophy of thyroid (acquired): Secondary | ICD-10-CM

## 2021-02-26 DIAGNOSIS — F5101 Primary insomnia: Secondary | ICD-10-CM | POA: Diagnosis not present

## 2021-02-26 DIAGNOSIS — F4321 Adjustment disorder with depressed mood: Secondary | ICD-10-CM

## 2021-02-26 DIAGNOSIS — F411 Generalized anxiety disorder: Secondary | ICD-10-CM

## 2021-02-26 DIAGNOSIS — F410 Panic disorder [episodic paroxysmal anxiety] without agoraphobia: Secondary | ICD-10-CM

## 2021-02-26 DIAGNOSIS — E782 Mixed hyperlipidemia: Secondary | ICD-10-CM

## 2021-02-26 DIAGNOSIS — I10 Essential (primary) hypertension: Secondary | ICD-10-CM

## 2021-02-26 DIAGNOSIS — F4320 Adjustment disorder, unspecified: Secondary | ICD-10-CM

## 2021-02-26 MED ORDER — LORAZEPAM 0.5 MG PO TABS: 0.5000 mg | ORAL_TABLET | Freq: Every evening | ORAL | 1 refills | Status: DC | PRN

## 2021-02-26 NOTE — Progress Notes (Signed)
Subjective: CC:Insomnia, hypothyroidism PCP: Janora Norlander, DO HPI:Catherine Mayer is a 70 y.o. female presenting to clinic today for:  1. Insomnia Trialed with Ambien $RemoveBef'5mg'YytKxhBymZ$  in July.  However she never did get this medication.  She resumed use of her Ativan and took up to 2 tablets at bedtime to equal 1 mg if needed.  She uses this infrequently but sometimes does have a lot of trouble since the passing of her husband Elenore Rota.  She reports good support from her pastor at work and her family.  Her daughter is residing with her at nighttime and make sure that she gets rounded meals.  Patient has been keeping busy in efforts to keep her mind off of his passing.  2. Hypothyroidism Compliant with thyroid medication.  No reports of change in voice or change in bowel habits  3.  Hypertension Patient has not taken any of her medications this morning because she anticipated fasting labs.  No chest pain, shortness of breath   ROS: Per HPI  No Known Allergies Past Medical History:  Diagnosis Date   Anxiety    Arthritis    hands, knees   Depression    GERD (gastroesophageal reflux disease)    Hyperlipidemia    Hypertension    Hypothyroidism    Thyroid disease    Trimalleolar fracture of ankle, closed, right, initial encounter     Current Outpatient Medications:    amLODipine (NORVASC) 5 MG tablet, TAKE 1 TABLET BY MOUTH  DAILY, Disp: 90 tablet, Rfl: 1   diclofenac Sodium (VOLTAREN) 1 % GEL, , Disp: , Rfl:    escitalopram (LEXAPRO) 20 MG tablet, Take 1 tablet (20 mg total) by mouth daily., Disp: 90 tablet, Rfl: 0   levothyroxine (SYNTHROID) 137 MCG tablet, TAKE 1 TABLET BY MOUTH  DAILY BEFORE BREAKFAST, Disp: 90 tablet, Rfl: 3   meloxicam (MOBIC) 15 MG tablet, Take 15 mg by mouth daily as needed for pain. (Patient not taking: Reported on 08/29/2020), Disp: , Rfl:    omeprazole (PRILOSEC) 20 MG capsule, Take 20 mg by mouth daily. , Disp: , Rfl:    pravastatin (PRAVACHOL) 80 MG tablet, TAKE 1  TABLET BY MOUTH  DAILY, Disp: 90 tablet, Rfl: 3   triamcinolone cream (KENALOG) 0.1 %, APPLY TOPICALLY TWICE DAILY FOR 7 TO 10 DAYS, Disp: 30 g, Rfl: 0   valsartan-hydrochlorothiazide (DIOVAN-HCT) 160-25 MG tablet, TAKE 1 TABLET BY MOUTH  DAILY (REPLACING THE  HYZAAR), Disp: 90 tablet, Rfl: 0   zolpidem (AMBIEN) 5 MG tablet, Take 0.5-1 tablets (2.5-5 mg total) by mouth at bedtime as needed for sleep., Disp: 15 tablet, Rfl: 0 Social History   Socioeconomic History   Marital status: Married    Spouse name: Elenore Rota   Number of children: 1   Years of education: Not on file   Highest education level: Associate degree: occupational, Hotel manager, or vocational program  Occupational History   Occupation: Economist    Comment: part time  Tobacco Use   Smoking status: Never   Smokeless tobacco: Never  Vaping Use   Vaping Use: Never used  Substance and Sexual Activity   Alcohol use: Not Currently   Drug use: Never   Sexual activity: Not on file  Other Topics Concern   Not on file  Social History Narrative   Enjoys playing with dogs and being outside.    Social Determinants of Health   Financial Resource Strain: Low Risk    Difficulty of Paying Living Expenses: Not hard  at all  Food Insecurity: No Food Insecurity   Worried About Charity fundraiser in the Last Year: Never true   Ran Out of Food in the Last Year: Never true  Transportation Needs: No Transportation Needs   Lack of Transportation (Medical): No   Lack of Transportation (Non-Medical): No  Physical Activity: Inactive   Days of Exercise per Week: 0 days   Minutes of Exercise per Session: 0 min  Stress: No Stress Concern Present   Feeling of Stress : Only a little  Social Connections: Moderately Integrated   Frequency of Communication with Friends and Family: More than three times a week   Frequency of Social Gatherings with Friends and Family: More than three times a week   Attends Religious Services: 1 to 4 times per  year   Active Member of [employer] or Organizations: No   Attends Music therapist: Never   Marital Status: Married  Human resources officer Violence: Not At Risk   Fear of Current or Ex-Partner: No   Emotionally Abused: No   Physically Abused: No   Sexually Abused: No   Family History  Problem Relation Age of Onset   Hypertension Mother    Hyperlipidemia Mother    Hypertension Father    Hypertension Sister    Hypertension Son    Hyperlipidemia Son    Hypertension Sister    Hypertension Sister     Objective: Office vital signs reviewed. BP (!) 152/100   Pulse 72   Temp 97.8 F (36.6 C)   Ht 5\' 1"  (1.549 m)   Wt 161 lb (73 kg)   SpO2 96%   BMI 30.42 kg/m   Physical Examination:  General: Awake, alert, well nourished, No acute distress HEENT: No goiter.  No exophthalmos Cardio: regular rate and rhythm, S1S2 heard, no murmurs appreciated Pulm: clear to auscultation bilaterally, no wheezes, rhonchi or rales; normal work of breathing on room air Psych: Mood somewhat depressed but patient is pleasant and Proofreader Office Visit from 02/26/2021 in [place]  PHQ-2 Total Score 4      GAD 7 : Generalized Anxiety Score 02/26/2021 08/14/2020 01/17/2020 11/21/2019  Nervous, Anxious, on Edge 2 1 1 1   Control/stop worrying 2 1 1 2   Worry too much - different things 2 1 1 2   Trouble relaxing 2 1 1 1   Restless 2 1 0 1  Easily annoyed or irritable 1 0 1 1  Afraid - awful might happen 1 0 0 1  Total GAD 7 Score 12 5 5 9   Anxiety Difficulty Somewhat difficult Somewhat difficult Somewhat difficult -    Assessment/ Plan: 70 y.o. female   Primary insomnia - Plan: LORazepam (ATIVAN) 0.5 MG tablet  Generalized anxiety disorder with panic attacks - Plan: LORazepam (ATIVAN) 0.5 MG tablet  Grief reaction  Hypothyroidism due to acquired atrophy of thyroid - Plan: CMP14+EGFR, TSH, T4, Free  Essential hypertension with goal blood pressure less  than 130/80  Mixed hyperlipidemia - Plan: CMP14+EGFR, Lipid Panel  We will go back to Ativan.  Discussed sparing use.  National narcotic database was reviewed and there were no red flags.  Refills have been sent  Check thyroid levels.  Blood pressure not at goal with patient has not taken her antihypertensives this morning.  Resume use of Diovan-HCTZ  Check fasting lipid panel, CMP  No orders of the defined types were placed in this encounter.  No orders of the defined types were placed in  this encounter.    Janora Norlander, DO [place] [phone]

## 2021-02-27 LAB — TSH: TSH: 1.89 u[IU]/mL (ref 0.450–4.500)

## 2021-02-27 LAB — CMP14+EGFR
ALT: 20 IU/L (ref 0–32)
AST: 17 IU/L (ref 0–40)
Albumin/Globulin Ratio: 2.1 (ref 1.2–2.2)
Albumin: 4.4 g/dL (ref 3.8–4.8)
Alkaline Phosphatase: 104 IU/L (ref 44–121)
BUN/Creatinine Ratio: 14 (ref 12–28)
BUN: 12 mg/dL (ref 8–27)
Bilirubin Total: 0.5 mg/dL (ref 0.0–1.2)
CO2: 26 mmol/L (ref 20–29)
Calcium: 9 mg/dL (ref 8.7–10.3)
Chloride: 99 mmol/L (ref 96–106)
Creatinine, Ser: 0.85 mg/dL (ref 0.57–1.00)
Globulin, Total: 2.1 g/dL (ref 1.5–4.5)
Glucose: 105 mg/dL — ABNORMAL HIGH (ref 65–99)
Potassium: 3.5 mmol/L (ref 3.5–5.2)
Sodium: 139 mmol/L (ref 134–144)
Total Protein: 6.5 g/dL (ref 6.0–8.5)
eGFR: 74 mL/min/{1.73_m2} (ref >59)

## 2021-02-27 LAB — LIPID PANEL
Chol/HDL Ratio: 4.7 ratio — ABNORMAL HIGH (ref 0.0–4.4)
Cholesterol, Total: 222 mg/dL — ABNORMAL HIGH (ref 100–199)
HDL: 47 mg/dL (ref >39)
LDL Chol Calc (NIH): 144 mg/dL — ABNORMAL HIGH (ref 0–99)
Triglycerides: 171 mg/dL — ABNORMAL HIGH (ref 0–149)
VLDL Cholesterol Cal: 31 mg/dL (ref 5–40)

## 2021-02-27 LAB — T4, FREE: Free T4: 2.07 ng/dL — ABNORMAL HIGH (ref 0.82–1.77)

## 2021-02-27 NOTE — Progress Notes (Signed)
Patient calling back about labs. Please leave on voicemail if she does not answer.

## 2021-02-28 NOTE — Progress Notes (Signed)
Returning nurse call.

## 2021-03-01 NOTE — Progress Notes (Signed)
Pt r/c labs 

## 2021-03-22 ENCOUNTER — Other Ambulatory Visit: Payer: Self-pay | Admitting: Family Medicine

## 2021-03-22 DIAGNOSIS — I10 Essential (primary) hypertension: Secondary | ICD-10-CM

## 2021-04-05 ENCOUNTER — Other Ambulatory Visit: Payer: Self-pay | Admitting: *Deleted

## 2021-04-05 MED ORDER — OMEPRAZOLE 20 MG PO CPDR: 20.0000 mg | DELAYED_RELEASE_CAPSULE | Freq: Every day | ORAL | 3 refills | Status: DC

## 2021-04-09 ENCOUNTER — Other Ambulatory Visit: Payer: Self-pay | Admitting: Family Medicine

## 2021-04-10 ENCOUNTER — Other Ambulatory Visit: Payer: Self-pay | Admitting: Family Medicine

## 2021-06-11 ENCOUNTER — Other Ambulatory Visit: Payer: Self-pay | Admitting: Family Medicine

## 2021-06-17 DIAGNOSIS — Z1231 Encounter for screening mammogram for malignant neoplasm of breast: Secondary | ICD-10-CM | POA: Diagnosis not present

## 2021-06-17 DIAGNOSIS — L900 Lichen sclerosus et atrophicus: Secondary | ICD-10-CM | POA: Diagnosis not present

## 2021-08-27 ENCOUNTER — Encounter: Payer: Self-pay | Admitting: Family Medicine

## 2021-08-27 ENCOUNTER — Ambulatory Visit (INDEPENDENT_AMBULATORY_CARE_PROVIDER_SITE_OTHER): Payer: Medicare Other | Admitting: Family Medicine

## 2021-08-27 VITALS — BP 138/88 | HR 78 | Temp 98.1°F | Ht 61.0 in | Wt 161.4 lb

## 2021-08-27 DIAGNOSIS — F410 Panic disorder [episodic paroxysmal anxiety] without agoraphobia: Secondary | ICD-10-CM

## 2021-08-27 DIAGNOSIS — L821 Other seborrheic keratosis: Secondary | ICD-10-CM

## 2021-08-27 DIAGNOSIS — F411 Generalized anxiety disorder: Secondary | ICD-10-CM | POA: Diagnosis not present

## 2021-08-27 DIAGNOSIS — E782 Mixed hyperlipidemia: Secondary | ICD-10-CM

## 2021-08-27 DIAGNOSIS — L918 Other hypertrophic disorders of the skin: Secondary | ICD-10-CM

## 2021-08-27 DIAGNOSIS — F5101 Primary insomnia: Secondary | ICD-10-CM

## 2021-08-27 DIAGNOSIS — Z79899 Other long term (current) drug therapy: Secondary | ICD-10-CM

## 2021-08-27 DIAGNOSIS — I10 Essential (primary) hypertension: Secondary | ICD-10-CM

## 2021-08-27 DIAGNOSIS — E034 Atrophy of thyroid (acquired): Secondary | ICD-10-CM | POA: Diagnosis not present

## 2021-08-27 MED ORDER — LORAZEPAM 0.5 MG PO TABS: 0.5000 mg | ORAL_TABLET | Freq: Every evening | ORAL | 1 refills | Status: DC | PRN

## 2021-08-27 NOTE — Patient Instructions (Signed)
Skin Tag, Adult  A skin tag (acrochordon) is a soft, extra growth of skin. Most skin tags are skin-colored and rarely bigger than a pencil eraser. They commonly form in areas where there is frequent rubbing, or friction, on the skin. This may be where there are folds in the skin, such as the eyelids, neck, armpit, or groin. Skin tags are not dangerous, and they do not spread from person to person (are not contagious). You may have one skin tag or several. Skin tags do not require treatment. However, your health care provider may recommend removal of a skin tag if it: Gets irritated from clothing or jewelry. Bleeds. Is visible and unsightly. What are the causes? This condition is linked with: Increasing age. Pregnancy. Diabetes. Obesity. What are the signs or symptoms? Skin tags usually do not cause symptoms unless they get irritated by items touching your skin, such as clothing or jewelry. When this happens, you may have pain, itching, or bleeding. How is this diagnosed? This condition is diagnosed with an evaluation from your health care provider. No testing is needed for diagnosis. How is this treated? Treatment for this condition depends on whether you have symptoms. If a skin tag needs to be removed, your health care provider can remove it with: A simple surgical procedure using scissors. A procedure that involves freezing your skin tag with a gas in liquid form (liquid nitrogen). A procedure that uses heat to destroy your skin tag (electrodessication). Your health care provider may also remove your skin tag if it is visible or unsightly, Follow these instructions at home: Watch for any changes in your skin tag. A normal skin tag does not require any other special care at home. Take over-the-counter and prescription medicines only as told by your health care provider. Keep all follow-up visits as told by your health care provider. This is important. Contact a health care provider  if: You have a skin tag that: Becomes painful. Changes color. Bleeds. Swells. Summary Skin tags are soft, extra growths of skin found in areas of frequent rubbing or friction. Skin tags usually do not cause symptoms. If symptoms occur, you may have pain, itching, or bleeding. If your skin tag causes symptoms or is unsightly, your health care provider can remove it. This information is not intended to replace advice given to you by your health care provider. Make sure you discuss any questions you have with your health care provider. Document Revised: 04/25/2019 Document Reviewed: 04/25/2019 Elsevier Patient Education  2022 Elsevier Inc.  

## 2021-08-27 NOTE — Progress Notes (Signed)
Subjective: CC:Insomnia, HLD, HTN PCP: Janora Norlander, DO HPI:Catherine Mayer is a 71 y.o. female presenting to clinic today for:  1. HTN, HLD Compliant with her antihypertensives and cholesterol medication.  No chest pain, shortness of breath.  She stays active around her house and in fact has started doing a deep cleaning and trying to condense her lungs  2. Insomnia Insomnia has been stable.  She uses the Ativan as needed.  Denies any excessive daytime sedation, falls, respiratory depression, memory issues.  From a mental health standpoint/mood standpoint, she continues to do better with each day.  She of course still misses her spouse, who passed away.  However, she feels like she has good support from her family and friends.  Her grandson resides with her.  3.  Skin lesion Patient reports a skin lesion on the left upper abdomen that catches on her clothing and sometimes will be irritated or bleed.  She would like me to look at that today.  She also has a lesion on the right shoulder that she would like me to look at   ROS: Per HPI  No Known Allergies Past Medical History:  Diagnosis Date   Anxiety    Arthritis    hands, knees   Depression    GERD (gastroesophageal reflux disease)    Hyperlipidemia    Hypertension    Hypothyroidism    Thyroid disease    Trimalleolar fracture of ankle, closed, right, initial encounter     Current Outpatient Medications:    amLODipine (NORVASC) 5 MG tablet, TAKE 1 TABLET BY MOUTH  DAILY, Disp: 90 tablet, Rfl: 3   diclofenac Sodium (VOLTAREN) 1 % GEL, , Disp: , Rfl:    escitalopram (LEXAPRO) 20 MG tablet, TAKE 1 TABLET BY MOUTH  DAILY, Disp: 90 tablet, Rfl: 1   levothyroxine (SYNTHROID) 137 MCG tablet, TAKE 1 TABLET BY MOUTH  DAILY BEFORE BREAKFAST, Disp: 90 tablet, Rfl: 2   LORazepam (ATIVAN) 0.5 MG tablet, Take 1 tablet (0.5 mg total) by mouth at bedtime as needed for anxiety., Disp: 90 tablet, Rfl: 1   meloxicam (MOBIC) 15 MG tablet, Take  15 mg by mouth daily as needed for pain., Disp: , Rfl:    omeprazole (PRILOSEC) 20 MG capsule, Take 1 capsule (20 mg total) by mouth daily., Disp: 90 capsule, Rfl: 3   pravastatin (PRAVACHOL) 80 MG tablet, TAKE 1 TABLET BY MOUTH  DAILY, Disp: 90 tablet, Rfl: 0   triamcinolone cream (KENALOG) 0.1 %, APPLY TOPICALLY TWICE DAILY FOR 7 TO 10 DAYS, Disp: 30 g, Rfl: 0   valsartan-hydrochlorothiazide (DIOVAN-HCT) 160-25 MG tablet, TAKE 1 TABLET BY MOUTH  DAILY, Disp: 90 tablet, Rfl: 1 Social History   Socioeconomic History   Marital status: Married    Spouse name: Elenore Rota   Number of children: 1   Years of education: Not on file   Highest education level: Associate degree: occupational, Hotel manager, or vocational program  Occupational History   Occupation: Economist    Comment: part time  Tobacco Use   Smoking status: Never   Smokeless tobacco: Never  Vaping Use   Vaping Use: Never used  Substance and Sexual Activity   Alcohol use: Not Currently   Drug use: Never   Sexual activity: Not on file  Other Topics Concern   Not on file  Social History Narrative   Enjoys playing with dogs and being outside.    Social Determinants of Health   Financial Resource Strain: Low Risk  Difficulty of Paying Living Expenses: Not hard at all  Food Insecurity: No Food Insecurity   Worried About [place] in the Last Year: Never true   Ran Out of Food in the Last Year: Never true  Transportation Needs: No Transportation Needs   Lack of Transportation (Medical): No   Lack of Transportation (Non-Medical): No  Physical Activity: Inactive   Days of Exercise per Week: 0 days   Minutes of Exercise per Session: 0 min  Stress: No Stress Concern Present   Feeling of Stress : Only a little  Social Connections: Moderately Integrated   Frequency of Communication with Friends and Family: More than three times a week   Frequency of Social Gatherings with Friends and Family: More than three times  a week   Attends Religious Services: 1 to 4 times per year   Active Member of [employer] or Organizations: No   Attends Music therapist: Never   Marital Status: Married  Human resources officer Violence: Not At Risk   Fear of Current or Ex-Partner: No   Emotionally Abused: No   Physically Abused: No   Sexually Abused: No   Family History  Problem Relation Age of Onset   Hypertension Mother    Hyperlipidemia Mother    Hypertension Father    Hypertension Sister    Hypertension Son    Hyperlipidemia Son    Hypertension Sister    Hypertension Sister     Objective: Office vital signs reviewed. BP 138/88    Pulse 78    Temp 98.1 F (36.7 C)    Ht $R'5\' 1"'oQ$  (1.549 m)    Wt 161 lb 6.4 oz (73.2 kg)    SpO2 97%    BMI 30.50 kg/m   Physical Examination:  General: Awake, alert, well nourished, No acute distress HEENT: Sclera white.  No exophthalmos or goiter Cardio: regular rate and rhythm, S1S2 heard, no murmurs appreciated Pulm: clear to auscultation bilaterally, no wheezes, rhonchi or rales; normal work of breathing on room air Extremities: warm, well perfused, No edema, cyanosis or clubbing; +2 pulses bilaterally MSK: normal gait and station Psych: Mood stable, speech normal, affect appropriate Skin: Inflamed skin tag noted in the left upper abdomen.  Right shoulder with 3 mm seborrheic keratoses  PROCEDURE NOTE: Skin tag removal Patient given informed consent, signed copy in the chart.  Appropriate time out taken. Areas of concern cleansed with alcohol swabs.  Area was anesthetized using ethyl chloride.  Once anaesthesia obtained, skin tags were removed using sterile scissors.  1 skin tags were removed in total from the left upper abdomen.  The patient tolerated the procedure well.  Minimal bleeding.  Patient given post procedure instructions.   Assessment/ Plan: 71 y.o. female   Primary insomnia - Plan: ToxASSURE Select 13 (MW), Urine, LORazepam (ATIVAN) 0.5 MG  tablet  Generalized anxiety disorder with panic attacks - Plan: ToxASSURE Select 13 (MW), Urine, LORazepam (ATIVAN) 0.5 MG tablet  Controlled substance agreement signed - Plan: ToxASSURE Select 13 (MW), Urine  Hypothyroidism due to acquired atrophy of thyroid - Plan: TSH, T4, Free  Essential hypertension with goal blood pressure less than 130/80 - Plan: CMP14+EGFR  Mixed hyperlipidemia - Plan: Lipid Panel, CMP14+EGFR  Inflamed skin tag  Seborrheic keratosis  Insomnia stable with as needed use of Ativan.  UDS and CSC were updated as per office policy today.  National narcotic database reviewed and there were no red flags.  Rx sent to mail order pharmacy to place  on hold.  It sounds like overall she seems to be doing much better from a mental health standpoint.  She seem to be in fairly good spirits today  She is asymptomatic from a thyroid standpoint.  Check TSH, free T4  Blood pressure controlled.  No changes.  Continue current regimen.  Check CMP  Check fasting lipid.  Continue statin  Her inflamed skin tag was removed today.  No immediate complications or bleeding.  Home care instructions reviewed and reasons for reevaluation discussed  The other lesion of concern was a seborrheic keratosis.  Discussed the benign nature of this.  No orders of the defined types were placed in this encounter.  No orders of the defined types were placed in this encounter.    Janora Norlander, DO [place] [phone]

## 2021-08-28 LAB — CMP14+EGFR
ALT: 35 IU/L — ABNORMAL HIGH (ref 0–32)
AST: 27 IU/L (ref 0–40)
Albumin/Globulin Ratio: 1.9 (ref 1.2–2.2)
Albumin: 4.4 g/dL (ref 3.8–4.8)
Alkaline Phosphatase: 105 IU/L (ref 44–121)
BUN/Creatinine Ratio: 17 (ref 12–28)
BUN: 14 mg/dL (ref 8–27)
Bilirubin Total: 0.4 mg/dL (ref 0.0–1.2)
CO2: 26 mmol/L (ref 20–29)
Calcium: 9.2 mg/dL (ref 8.7–10.3)
Chloride: 100 mmol/L (ref 96–106)
Creatinine, Ser: 0.83 mg/dL (ref 0.57–1.00)
Globulin, Total: 2.3 g/dL (ref 1.5–4.5)
Glucose: 96 mg/dL (ref 70–99)
Potassium: 3.5 mmol/L (ref 3.5–5.2)
Sodium: 142 mmol/L (ref 134–144)
Total Protein: 6.7 g/dL (ref 6.0–8.5)
eGFR: 76 mL/min/{1.73_m2} (ref >59)

## 2021-08-28 LAB — LIPID PANEL
Chol/HDL Ratio: 4.3 ratio (ref 0.0–4.4)
Cholesterol, Total: 210 mg/dL — ABNORMAL HIGH (ref 100–199)
HDL: 49 mg/dL (ref >39)
LDL Chol Calc (NIH): 131 mg/dL — ABNORMAL HIGH (ref 0–99)
Triglycerides: 169 mg/dL — ABNORMAL HIGH (ref 0–149)
VLDL Cholesterol Cal: 30 mg/dL (ref 5–40)

## 2021-08-28 LAB — TSH: TSH: 0.485 u[IU]/mL (ref 0.450–4.500)

## 2021-08-28 LAB — T4, FREE: Free T4: 1.48 ng/dL (ref 0.82–1.77)

## 2021-08-29 ENCOUNTER — Other Ambulatory Visit: Payer: Self-pay | Admitting: Family Medicine

## 2021-08-30 ENCOUNTER — Ambulatory Visit (INDEPENDENT_AMBULATORY_CARE_PROVIDER_SITE_OTHER): Payer: Medicare Other

## 2021-08-30 VITALS — Wt 161.0 lb

## 2021-08-30 DIAGNOSIS — Z0000 Encounter for general adult medical examination without abnormal findings: Secondary | ICD-10-CM

## 2021-08-30 DIAGNOSIS — N893 Dysplasia of vagina, unspecified: Secondary | ICD-10-CM | POA: Insufficient documentation

## 2021-08-30 NOTE — Progress Notes (Signed)
Subjective:   Catherine Mayer is a 71 y.o. female who presents for Medicare Annual (Subsequent) preventive examination.  Virtual Visit via Telephone Note  I connected with  Catherine Mayer on 08/30/21 at  9:45 AM EST by telephone and verified that I am speaking with the correct person using two identifiers.  Location: Patient: Home Provider: [facility] Persons participating in the virtual visit: patient/Nurse Health Advisor   I discussed the limitations, risks, security and privacy concerns of performing an evaluation and management service by telephone and the availability of in person appointments. The patient expressed understanding and agreed to proceed.  Interactive audio and video telecommunications were attempted between this nurse and patient, however failed, due to patient having technical difficulties OR patient did not have access to video capability.  We continued and completed visit with audio only.  Some vital signs may be absent or patient reported.   Catherine Mayer E Catherine Muckey, LPN   Review of Systems     Cardiac Risk Factors include: advanced age (>38men, >50 women);obesity (BMI >30kg/m2);hypertension;dyslipidemia     Objective:    Today's Vitals   08/30/21 0938  Weight: 161 lb (73 kg)  PainSc: 5    Body mass index is 30.42 kg/m.  Advanced Directives 08/30/2021 08/29/2020 06/14/2020 06/11/2020 06/10/2020 08/29/2019 07/02/2018  Does Patient Have a Medical Advance Directive? No No No No No No No  Would patient like information on creating a medical advance directive? No - Patient declined No - Patient declined No - Patient declined No - Patient declined No - Patient declined No - Patient declined No - Patient declined    Current Medications (verified) Outpatient Encounter Medications as of 08/30/2021  Medication Sig   amLODipine (NORVASC) 5 MG tablet TAKE 1 TABLET BY MOUTH  DAILY   diclofenac Sodium (VOLTAREN) 1 % GEL    escitalopram (LEXAPRO) 20 MG tablet TAKE 1 TABLET BY MOUTH  DAILY    levothyroxine (SYNTHROID) 137 MCG tablet TAKE 1 TABLET BY MOUTH  DAILY BEFORE BREAKFAST   LORazepam (ATIVAN) 0.5 MG tablet Take 1 tablet (0.5 mg total) by mouth at bedtime as needed for anxiety (place on HOLD. pt does not need yet.).   meloxicam (MOBIC) 15 MG tablet Take 15 mg by mouth daily as needed for pain.   omeprazole (PRILOSEC) 20 MG capsule Take 1 capsule (20 mg total) by mouth daily.   pravastatin (PRAVACHOL) 80 MG tablet TAKE 1 TABLET BY MOUTH DAILY   triamcinolone cream (KENALOG) 0.1 % APPLY TOPICALLY TWICE DAILY FOR 7 TO 10 DAYS   valsartan-hydrochlorothiazide (DIOVAN-HCT) 160-25 MG tablet TAKE 1 TABLET BY MOUTH  DAILY   No facility-administered encounter medications on file as of 08/30/2021.    Allergies (verified) Patient has no known allergies.   History: Past Medical History:  Diagnosis Date   Anxiety    Arthritis    hands, knees   Depression    GERD (gastroesophageal reflux disease)    Hyperlipidemia    Hypertension    Hypothyroidism    Thyroid disease    Trimalleolar fracture of ankle, closed, right, initial encounter    Past Surgical History:  Procedure Laterality Date   ABDOMINAL HYSTERECTOMY     CARPAL TUNNEL RELEASE Bilateral 2013   CHOLECYSTECTOMY     ORIF ANKLE FRACTURE Right 06/14/2020   Procedure: Open treatment right ankle trimalleolar fracture with internal fixation;  Surgeon: Catherine Arthurs, MD;  Location: [facility];  Service: Orthopedics;  Laterality: Right;    Family History  Problem Relation Age of Onset   Hypertension Mother    Hyperlipidemia Mother    Hypertension Father    Hypertension Sister    Hypertension Son    Hyperlipidemia Son    Hypertension Sister    Hypertension Sister    Social History   Socioeconomic History   Marital status: Widowed    Spouse name: Catherine Mayer   Number of children: 1   Years of education: Not on file   Highest education level: Associate degree: occupational, Scientist, product/process development, or vocational  program  Occupational History   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    Social Determinants of Health   Financial Resource Strain: Not on file  Food Insecurity: No Food Insecurity   Worried About Programme researcher, broadcasting/film/video in the Last Year: Never true   Ran Out of Food in the Last Year: Never true  Transportation Needs: Unknown   Lack of Transportation (Medical): No   Lack of Transportation (Non-Medical): Not on file  Physical Activity: Sufficiently Active   Days of Exercise per Week: 5 days   Minutes of Exercise per Session: 60 min  Stress: No Stress Concern Present   Feeling of Stress : Only a little  Social Connections: Moderately Integrated   Frequency of Communication with Friends and Family: More than three times a week   Frequency of Social Gatherings with Friends and Family: More than three times a week   Attends Religious Services: More than 4 times per year   Active Member of [employer] or Organizations: Yes   Attends Banker Meetings: More than 4 times per year   Marital Status: Widowed    Tobacco Counseling Counseling given: Not Answered   Clinical Intake:  Pre-visit preparation completed: Yes  Pain : 0-10 Pain Score: 5  Pain Type: Chronic pain Pain Location: Leg Pain Orientation: Right, Left Pain Descriptors / Indicators: Shooting Pain Onset: More than a month ago Pain Frequency: Intermittent     BMI - recorded: 30.42 Nutritional Status: BMI > 30  Obese Nutritional Risks: None Diabetes: No  How often do you need to have someone help you when you read instructions, pamphlets, or other  written materials from your doctor or pharmacy?: 1 - Never  Diabetic? no  Interpreter Needed?: No  Information entered by :: Javonnie Illescas, LPN   Activities of Daily Living In your present state of health, do you have any difficulty performing the following activities: 08/30/2021  Hearing? N  Vision? N  Difficulty concentrating or making decisions? N  Walking or climbing stairs? N  Dressing or bathing? N  Doing errands, shopping? N  Preparing Food and eating ? N  Using the Toilet? N  In the past six months, have you accidently leaked urine? Y  Comment urge incontinence - mild - only if she waits too long  Do you have problems with loss of bowel control? N  Managing your Medications? N  Managing your Finances? N  Housekeeping or managing your Housekeeping? N  Some recent data might be hidden    Patient  Care Team: [name], DO as PCP - General (Family Medicine) Meisinger, Tawanna Cooler, MD as Consulting Physician (Obstetrics and Gynecology)  Indicate any recent Medical Services you may have received from other than [facility] providers in the past year (date may be approximate).     Assessment:   This is a routine wellness examination for Izzy.  Hearing/Vision screen Hearing Screening - Comments:: Denies hearing difficulties   Vision Screening - Comments:: Wears rx glasses - up to date with routine eye exams with [facility]  Dietary issues and exercise activities discussed: Current Exercise Habits: Home exercise routine, Type of exercise: walking, Time (Minutes): 60, Frequency (Times/Week): 5, Weekly Exercise (Minutes/Week): 300, Intensity: Mild, Exercise limited by: orthopedic condition(s)   Goals Addressed             This Visit's Progress    Patient Stated       Wants to go on a cruise       Depression Screen PHQ 2/9 Scores 08/30/2021 08/27/2021 02/26/2021 08/29/2020 08/14/2020 08/14/2020 01/17/2020  PHQ - 2 Score 1 1 4  0 1 0 0  PHQ- 9 Score 4 5 12  - 3 - 5    Fall  Risk Fall Risk  08/30/2021 08/27/2021 02/26/2021 08/29/2020 08/14/2020  Falls in the past year? 0 1 1 1 1   Number falls in past yr: 0 0 0 0 0  Injury with Fall? 0 1 1 1 1   Comment - - - - -  Risk for fall due to : Orthopedic patient History of fall(s) History of fall(s) History of fall(s) No Fall Risks  Follow up Falls prevention discussed Education provided Falls evaluation completed;Education provided - Falls evaluation completed    FALL RISK PREVENTION PERTAINING TO THE HOME:  Any stairs in or around the home? Yes  If so, are there any without handrails? No  Home free of loose throw rugs in walkways, pet beds, electrical cords, etc? Yes  Adequate lighting in your home to reduce risk of falls? Yes   ASSISTIVE DEVICES UTILIZED TO PREVENT FALLS:  Life alert? No  Use of a cane, walker or w/c? No  Grab bars in the bathroom? Yes  Shower chair or bench in shower? No  Elevated toilet seat or a handicapped toilet? No   TIMED UP AND GO:  Was the test performed? No . Telephonic visit  Cognitive Function: Normal cognitive status assessed by direct observation by this Nurse Health Advisor. No abnormalities found.      6CIT Screen 08/29/2020 08/29/2019  What Year? 0 points 0 points  What month? 0 points 0 points  What time? 0 points 0 points  Count back from 20 0 points 0 points  Months in reverse 0 points 0 points  Repeat phrase 0 points 0 points  Total Score 0 0    Immunizations Immunization History  Administered Date(s) Administered   Influenza, High Dose Seasonal PF 07/22/2016, 03/18/2017, 03/22/2019   Influenza,inj,quad, With Preservative 04/06/2017   Moderna SARS-COV2 Booster Vaccination 09/12/2020   Moderna Sars-Covid-2 Vaccination 08/31/2019, 09/28/2019   Pneumococcal Conjugate-13 07/22/2016   Pneumococcal Polysaccharide-23 05/18/2019   Tdap 02/13/2015    TDAP status: Up to date  Flu Vaccine status: Due, Education has been provided regarding the importance of this  vaccine. Advised may receive this vaccine at local pharmacy or Health Dept. Aware to provide a copy of the vaccination record if obtained from local pharmacy or Health Dept. Verbalized acceptance and understanding.  Pneumococcal vaccine status: Up to date  Covid-19  vaccine status: Completed vaccines  Qualifies for Shingles Vaccine? Yes   Zostavax completed No   Shingrix Completed?: No.    Education has been provided regarding the importance of this vaccine. Patient has been advised to call insurance company to determine out of pocket expense if they have not yet received this vaccine. Advised may also receive vaccine at local pharmacy or Health Dept. Verbalized acceptance and understanding.  Screening Tests Health Maintenance  Topic Date Due   COVID-19 Vaccine (3 - Moderna risk series) 09/12/2021 (Originally 10/10/2020)   INFLUENZA VACCINE  10/04/2021 (Originally 02/04/2021)   Zoster Vaccines- Shingrix (1 of 2) 11/24/2021 (Originally [birth year]1970)   MAMMOGRAM  02/26/2022 (Originally 01/18/2001)   COLONOSCOPY (Pts 45-2086yrs Insurance coverage will need to be confirmed)  02/26/2022 (Originally 01/19/1996)   DEXA SCAN  03/27/2022   TETANUS/TDAP  02/12/2025   Pneumonia Vaccine 1065+ Years old  Completed   Hepatitis C Screening  Completed   HPV VACCINES  Aged Out    Health Maintenance  There are no preventive care reminders to display for this patient.  Declines Colonoscopy - advised to research Cologuard and consider  Mammogram status: No longer required due to declines.  Bone Density status: Completed 03/27/2020. Results reflect: Bone density results: OSTEOPENIA. Repeat every 2 years.  Lung Cancer Screening: (Low Dose CT Chest recommended if Age 32-80 years, 30 pack-year currently smoking OR have quit w/in 15years.) does not qualify  Additional Screening:  Hepatitis C Screening: does qualify; Completed 07/22/2016  Vision Screening: Recommended annual ophthalmology exams for early detection of  glaucoma and other disorders of the eye. Is the patient up to date with their annual eye exam?  Yes  Who is the provider or what is the name of the office in which the patient attends annual eye exams? [facility] If pt is not established with a provider, would they like to be referred to a provider to establish care? No .   Dental Screening: Recommended annual dental exams for proper oral hygiene  Community Resource Referral / Chronic Care Management: CRR required this visit?  No   CCM required this visit?  No      Plan:     I have personally reviewed and noted the following in the patients chart:   Medical and social history Use of alcohol, tobacco or illicit drugs  Current medications and supplements including opioid prescriptions.  Functional ability and status Nutritional status Physical activity Advanced directives List of other physicians Hospitalizations, surgeries, and ER visits in previous 12 months Vitals Screenings to include cognitive, depression, and falls Referrals and appointments  In addition, I have reviewed and discussed with patient certain preventive protocols, quality metrics, and best practice recommendations. A written personalized care plan for preventive services as well as general preventive health recommendations were provided to patient.     [place] Constablemy E Brianda Beitler, LPN   [id number]/24/2023   Nurse Notes: She forgot to mention at visit, she has intermittent sciatic pain (thinks this is what it is) - without warning, she will start to stand and feel a sharp shooting pain down the back of one leg, sometimes left, sometimes right; the pain is so sharp, it will bring tears to her eyes. I gave her exercises to try and advised if it continues or gets worse to make appt

## 2021-08-30 NOTE — Patient Instructions (Signed)
[name] , Thank you for taking time to come for your Medicare Wellness Visit. I appreciate your ongoing commitment to your health goals. Please review the following plan we discussed and let me know if I can assist you in the future.   Screening recommendations/referrals: Colonoscopy: Declined (consider Cologuard - at home test) Mammogram: Declined (recommended every year) Bone Density: Done 03/27/2020 - Repeat every 2 years Recommended yearly ophthalmology/optometry visit for glaucoma screening and checkup Recommended yearly dental visit for hygiene and checkup  Vaccinations: Influenza vaccine: Due every fall Pneumococcal vaccine: Done 07/22/2016 & 05/18/2019 Tdap vaccine: Done 02/13/2015 - Repeat in 10 years Shingles vaccine: Consider this soon - we recommend 2 doses 2-6 months apart - over 90% effective   Covid-19: Done 08/31/2019, 08/31/2019, & 09/12/2020 - for additional boosters, contact pharmacy  Advanced directives: Advance directive discussed with you today. Even though you declined this today, please call our office should you change your mind, and we can give you the proper paperwork for you to fill out.   Conditions/risks identified: see end of this summary for exercises to help with lower back/ sciatic pain  Next appointment: Follow up in one year for your annual wellness visit    Preventive Care 65 Years and Older, Female Preventive care refers to lifestyle choices and visits with your health care provider that can promote health and wellness. What does preventive care include? A yearly physical exam. This is also called an annual well check. Dental exams once or twice a year. Routine eye exams. Ask your health care provider how often you should have your eyes checked. Personal lifestyle choices, including: Daily care of your teeth and gums. Regular physical activity. Eating a healthy diet. Avoiding tobacco and drug use. Limiting alcohol use. Practicing safe sex. Taking  low-dose aspirin every day. Taking vitamin and mineral supplements as recommended by your health care provider. What happens during an annual well check? The services and screenings done by your health care provider during your annual well check will depend on your age, overall health, lifestyle risk factors, and family history of disease. Counseling  Your health care provider may ask you questions about your: Alcohol use. Tobacco use. Drug use. Emotional well-being. Home and relationship well-being. Sexual activity. Eating habits. History of falls. Memory and ability to understand (cognition). Work and work Astronomerenvironment. Reproductive health. Screening  You may have the following tests or measurements: Height, weight, and BMI. Blood pressure. Lipid and cholesterol levels. These may be checked every 5 years, or more frequently if you are over 645 years old. Skin check. Lung cancer screening. You may have this screening every year starting at age 71 if you have a 30-pack-year history of smoking and currently smoke or have quit within the past 15 years. Fecal occult blood test (FOBT) of the stool. You may have this test every year starting at age 71. Flexible sigmoidoscopy or colonoscopy. You may have a sigmoidoscopy every 5 years or a colonoscopy every 10 years starting at age 71. Hepatitis C blood test. Hepatitis B blood test. Sexually transmitted disease (STD) testing. Diabetes screening. This is done by checking your blood sugar (glucose) after you have not eaten for a while (fasting). You may have this done every 1-3 years. Bone density scan. This is done to screen for osteoporosis. You may have this done starting at age 71. Mammogram. This may be done every 1-2 years. Talk to your health care provider about how often you should have regular mammograms. Talk with  your health care provider about your test results, treatment options, and if necessary, the need for more tests. Vaccines   Your health care provider may recommend certain vaccines, such as: Influenza vaccine. This is recommended every year. Tetanus, diphtheria, and acellular pertussis (Tdap, Td) vaccine. You may need a Td booster every 10 years. Zoster vaccine. You may need this after age 54. Pneumococcal 13-valent conjugate (PCV13) vaccine. One dose is recommended after age 55. Pneumococcal polysaccharide (PPSV23) vaccine. One dose is recommended after age 72. Talk to your health care provider about which screenings and vaccines you need and how often you need them. This information is not intended to replace advice given to you by your health care provider. Make sure you discuss any questions you have with your health care provider. Document Released: 07/20/2015 Document Revised: 03/12/2016 Document Reviewed: 04/24/2015 Elsevier Interactive Patient Education  2017 [employer].  Fall Prevention in the Home Falls can cause injuries. They can happen to people of all ages. There are many things you can do to make your home safe and to help prevent falls. What can I do on the outside of my home? Regularly fix the edges of walkways and driveways and fix any cracks. Remove anything that might make you trip as you walk through a door, such as a raised step or threshold. Trim any bushes or trees on the path to your home. Use bright outdoor lighting. Clear any walking paths of anything that might make someone trip, such as rocks or tools. Regularly check to see if handrails are loose or broken. Make sure that both sides of any steps have handrails. Any raised decks and porches should have guardrails on the edges. Have any leaves, snow, or ice cleared regularly. Use sand or salt on walking paths during winter. Clean up any spills in your garage right away. This includes oil or grease spills. What can I do in the bathroom? Use night lights. Install grab bars by the toilet and in the tub and shower. Do not use towel  bars as grab bars. Use non-skid mats or decals in the tub or shower. If you need to sit down in the shower, use a plastic, non-slip stool. Keep the floor dry. Clean up any water that spills on the floor as soon as it happens. Remove soap buildup in the tub or shower regularly. Attach bath mats securely with double-sided non-slip rug tape. Do not have throw rugs and other things on the floor that can make you trip. What can I do in the bedroom? Use night lights. Make sure that you have a light by your bed that is easy to reach. Do not use any sheets or blankets that are too big for your bed. They should not hang down onto the floor. Have a firm chair that has side arms. You can use this for support while you get dressed. Do not have throw rugs and other things on the floor that can make you trip. What can I do in the kitchen? Clean up any spills right away. Avoid walking on wet floors. Keep items that you use a lot in easy-to-reach places. If you need to reach something above you, use a strong step stool that has a grab bar. Keep electrical cords out of the way. Do not use floor polish or wax that makes floors slippery. If you must use wax, use non-skid floor wax. Do not have throw rugs and other things on the floor that can make you trip.  What can I do with my stairs? Do not leave any items on the stairs. Make sure that there are handrails on both sides of the stairs and use them. Fix handrails that are broken or loose. Make sure that handrails are as long as the stairways. Check any carpeting to make sure that it is firmly attached to the stairs. Fix any carpet that is loose or worn. Avoid having throw rugs at the top or bottom of the stairs. If you do have throw rugs, attach them to the floor with carpet tape. Make sure that you have a light switch at the top of the stairs and the bottom of the stairs. If you do not have them, ask someone to add them for you. What else can I do to help  prevent falls? Wear shoes that: Do not have high heels. Have rubber bottoms. Are comfortable and fit you well. Are closed at the toe. Do not wear sandals. If you use a stepladder: Make sure that it is fully opened. Do not climb a closed stepladder. Make sure that both sides of the stepladder are locked into place. Ask someone to hold it for you, if possible. Clearly mark and make sure that you can see: Any grab bars or handrails. First and last steps. Where the edge of each step is. Use tools that help you move around (mobility aids) if they are needed. These include: Canes. Walkers. Scooters. Crutches. Turn on the lights when you go into a dark area. Replace any light bulbs as soon as they burn out. Set up your furniture so you have a clear path. Avoid moving your furniture around. If any of your floors are uneven, fix them. If there are any pets around you, be aware of where they are. Review your medicines with your doctor. Some medicines can make you feel dizzy. This can increase your chance of falling. Ask your doctor what other things that you can do to help prevent falls. This information is not intended to replace advice given to you by your health care provider. Make sure you discuss any questions you have with your health care provider. Document Released: 04/19/2009 Document Revised: 11/29/2015 Document Reviewed: 07/28/2014 Elsevier Interactive Patient Education  2017 [employer]   Sciatica Rehab Ask your health care provider which exercises are safe for you. Do exercises exactly as told by your health care provider and adjust them as directed. It is normal to feel mild stretching, pulling, tightness, or discomfort as you do these exercises. Stop right away if you feel sudden pain or your pain gets worse. Do not begin these exercises until told by your health care provider. Stretching and range-of-motion exercises These exercises warm up your muscles and joints and improve  the movement and flexibility of your hips and back. These exercises also help to relieve pain, numbness, and tingling. Sciatic nerve glide Sit in a chair with your head facing down toward your chest. Place your hands behind your back. Let your shoulders slump forward. Slowly straighten one of your legs while you tilt your head back as if you are looking toward the ceiling. Only straighten your leg as far as you can without making your symptoms worse. Hold this position for ____30____ seconds. Slowly return to the starting position. Repeat with your other leg. Repeat ____5____ times. Complete this exercise ___2___ times a day. Knee to chest with hip adduction and internal rotation  Lie on your back on a firm surface with both legs straight. [employer]  one of your knees and move it up toward your chest until you feel a gentle stretch in your lower back and buttock. Then, move your knee toward the shoulder that is on the opposite side from your leg. This is hip adduction and internal rotation. Hold your leg in this position by holding on to the front of your knee. Hold this position for ___30____ seconds. Slowly return to the starting position. Repeat with your other leg. Repeat ___5___ times. Complete this exercise ____2___ times a day. Prone extension on elbows  Lie on your abdomen on a firm surface. A bed may be too soft for this exercise. Prop yourself up on your elbows. Use your arms to help lift your chest up until you feel a gentle stretch in your abdomen and your lower back. This will place some of your body weight on your elbows. If this is uncomfortable, try stacking pillows under your chest. Your hips should stay down, against the surface that you are lying on. Keep your hip and back muscles relaxed. Hold this position for ____30___ seconds. Slowly relax your upper body and return to the starting position. Repeat ____5____ times. Complete this exercise ____2____ times a  day. Strengthening exercises These exercises build strength and endurance in your back. Endurance is the ability to use your muscles for a long time, even after they get tired. Pelvic tilt This exercise strengthens the muscles that lie deep in the abdomen. Lie on your back on a firm surface. Bend your knees and keep your feet flat on the floor. Tense your abdominal muscles. Tip your pelvis up toward the ceiling and flatten your lower back into the floor. To help with this exercise, you may place a small towel under your lower back and try to push your back into the towel. Hold this position for _____30_ seconds. Let your muscles relax completely before you repeat this exercise. Repeat ____5____ times. Complete this exercise _____2__ times a day. Alternating arm and leg raises  Get on your hands and knees on a firm surface. If you are on a hard floor, you may want to use padding, such as an exercise mat, to cushion your knees. Line up your arms and legs. Your hands should be directly below your shoulders, and your knees should be directly below your hips. Lift your left leg behind you. At the same time, raise your right arm and straighten it in front of you. Do not lift your leg higher than your hip. Do not lift your arm higher than your shoulder. Keep your abdominal and back muscles tight. Keep your hips facing the ground. Do not arch your back. Keep your balance carefully, and do not hold your breath. Hold this position for ___30____ seconds. Slowly return to the starting position. Repeat with your right leg and your left arm. Repeat ____5___ times. Complete this exercise _____2___ times a day. Posture and body mechanics Good posture and healthy body mechanics can help to relieve stress in your body's tissues and joints. Body mechanics refers to the movements and positions of your body while you do your daily activities. Posture is part of body mechanics. Good posture means: Your spine  is in its natural S-curve position (neutral). Your shoulders are pulled back slightly. Your head is not tipped forward. Follow these guidelines to improve your posture and body mechanics in your everyday activities. Standing  When standing, keep your spine neutral and your feet about hip width apart. Keep a slight bend in your knees.  Your ears, shoulders, and hips should line up. When you do a task in which you stand in one place for a long time, place one foot up on a stable object that is 2-4 inches (5-10 cm) high, such as a footstool. This helps keep your spine neutral. Sitting  When sitting, keep your spine neutral and keep your feet flat on the floor. Use a footrest, if necessary, and keep your thighs parallel to the floor. Avoid rounding your shoulders, and avoid tilting your head forward. When working at a desk or a computer, keep your desk at a height where your hands are slightly lower than your elbows. Slide your chair under your desk so you are close enough to maintain good posture. When working at a computer, place your monitor at a height where you are looking straight ahead and you do not have to tilt your head forward or downward to look at the screen. Resting When lying down and resting, avoid positions that are most painful for you. If you have pain with activities such as sitting, bending, stooping, or squatting, lie in a position in which your body does not bend very much. For example, avoid curling up on your side with your arms and knees near your chest (fetal position). If you have pain with activities such as standing for a long time or reaching with your arms, lie with your spine in a neutral position and bend your knees slightly. Try the following positions: Lying on your side with a pillow between your knees. Lying on your back with a pillow under your knees. Lifting  When lifting objects, keep your feet at least shoulder width apart and tighten your abdominal  muscles. Bend your knees and hips and keep your spine neutral. It is important to lift using the strength of your legs, not your back. Do not lock your knees straight out. Always ask for help to lift heavy or awkward objects. This information is not intended to replace advice given to you by your health care provider. Make sure you discuss any questions you have with your health care provider. Document Revised: 10/15/2018 Document Reviewed: 07/15/2018 Elsevier Patient Education  2022 [employer].

## 2021-09-01 LAB — TOXASSURE SELECT 13 (MW), URINE

## 2021-09-24 ENCOUNTER — Ambulatory Visit (INDEPENDENT_AMBULATORY_CARE_PROVIDER_SITE_OTHER): Payer: Medicare Other | Admitting: Family Medicine

## 2021-09-24 ENCOUNTER — Encounter: Payer: Self-pay | Admitting: Family Medicine

## 2021-09-24 VITALS — BP 160/98 | HR 94 | Temp 97.7°F | Resp 20 | Ht 61.0 in | Wt 162.0 lb

## 2021-09-24 DIAGNOSIS — S76012A Strain of muscle, fascia and tendon of left hip, initial encounter: Secondary | ICD-10-CM

## 2021-09-24 DIAGNOSIS — S76011A Strain of muscle, fascia and tendon of right hip, initial encounter: Secondary | ICD-10-CM

## 2021-09-24 NOTE — Patient Instructions (Signed)
I think you have hip flexor strain from sitting too much. ?

## 2021-09-24 NOTE — Progress Notes (Signed)
? ?Subjective: ?CC: Hip and thigh pain ? ?PCP: [name], DO ?HPI:Catherine Mayer is a 71 y.o. female presenting to clinic today for: ? ?1.  Hip and thigh pain ?Patient reports very brief hip and thigh pain that is been ongoing for the last several weeks.  She notes that this seemed to onset after she started sitting at her job more frequently.  She previously was allowed to run the floors and did not have this issue.  She points to anterior and posterior thighs as the area of spasm and pain.  These only last a few seconds but occur pretty much anytime she goes from a seated position to a standing position.  Denies any leg weakness or preceding injury.  No numbness or tingling.  No change in bowel habits or urination.  She has been doing Epsom salt soaks ? ? ?ROS: Per HPI ? ?No Known Allergies ?Past Medical History:  ?Diagnosis Date  ? Anxiety   ? Arthritis   ? hands, knees  ? Depression   ? GERD (gastroesophageal reflux disease)   ? Hyperlipidemia   ? Hypertension   ? Hypothyroidism   ? Thyroid disease   ? Trimalleolar fracture of ankle, closed, right, initial encounter   ? ? ?Current Outpatient Medications:  ?  amLODipine (NORVASC) 5 MG tablet, TAKE 1 TABLET BY MOUTH  DAILY, Disp: 90 tablet, Rfl: 3 ?  diclofenac Sodium (VOLTAREN) 1 % GEL, , Disp: , Rfl:  ?  escitalopram (LEXAPRO) 20 MG tablet, TAKE 1 TABLET BY MOUTH  DAILY, Disp: 90 tablet, Rfl: 1 ?  levothyroxine (SYNTHROID) 137 MCG tablet, TAKE 1 TABLET BY MOUTH  DAILY BEFORE BREAKFAST, Disp: 90 tablet, Rfl: 2 ?  LORazepam (ATIVAN) 0.5 MG tablet, Take 1 tablet (0.5 mg total) by mouth at bedtime as needed for anxiety (place on HOLD. pt does not need yet.)., Disp: 90 tablet, Rfl: 1 ?  meloxicam (MOBIC) 15 MG tablet, Take 15 mg by mouth daily as needed for pain., Disp: , Rfl:  ?  omeprazole (PRILOSEC) 20 MG capsule, Take 1 capsule (20 mg total) by mouth daily., Disp: 90 capsule, Rfl: 3 ?  pravastatin (PRAVACHOL) 80 MG tablet, TAKE 1 TABLET BY MOUTH DAILY,  Disp: 90 tablet, Rfl: 1 ?  triamcinolone cream (KENALOG) 0.1 %, APPLY TOPICALLY TWICE DAILY FOR 7 TO 10 DAYS, Disp: 30 g, Rfl: 0 ?  valsartan-hydrochlorothiazide (DIOVAN-HCT) 160-25 MG tablet, TAKE 1 TABLET BY MOUTH  DAILY, Disp: 90 tablet, Rfl: 1 ?Social History  ? ?Socioeconomic History  ? Marital status: Widowed  ?  Spouse name: [name]  ? Number of children: 1  ? Years of education: Not on file  ? Highest education level: Associate degree: occupational, Scientist, product/process development, or vocational program  ?Occupational History  ? Occupation: Control and instrumentation engineer  ?  Comment: part time  ?Tobacco Use  ? Smoking status: Never  ? Smokeless tobacco: Never  ?Vaping Use  ? Vaping Use: Never used  ?Substance and Sexual Activity  ? Alcohol use: Not Currently  ? Drug use: Never  ? Sexual activity: Not on file  ?Other Topics Concern  ? Not on file  ?Social History Narrative  ? Husband passed away 11/14/2020  ? Enjoys playing with dogs and being outside.   ? ?Social Determinants of Health  ? ?Financial Resource Strain: Not on file  ?Food Insecurity: No Food Insecurity  ? Worried About Programme researcher, broadcasting/film/video in the Last Year: Never true  ? Ran Out of Food in the  Last Year: Never true  ?Transportation Needs: Unknown  ? Lack of Transportation (Medical): No  ? Lack of Transportation (Non-Medical): Not on file  ?Physical Activity: Sufficiently Active  ? Days of Exercise per Week: 5 days  ? Minutes of Exercise per Session: 60 min  ?Stress: No Stress Concern Present  ? Feeling of Stress : Only a little  ?Social Connections: Moderately Integrated  ? Frequency of Communication with Friends and Family: More than three times a week  ? Frequency of Social Gatherings with Friends and Family: More than three times a week  ? Attends Religious Services: More than 4 times per year  ? Active Member of Clubs or Organizations: Yes  ? Attends Banker Meetings: More than 4 times per year  ? Marital Status: Widowed  ?Intimate Partner Violence: Not At Risk  ? Fear of  Current or Ex-Partner: No  ? Emotionally Abused: No  ? Physically Abused: No  ? Sexually Abused: No  ? ?Family History  ?Problem Relation Age of Onset  ? Hypertension Mother   ? Hyperlipidemia Mother   ? Hypertension Father   ? Hypertension Sister   ? Hypertension Son   ? Hyperlipidemia Son   ? Hypertension Sister   ? Hypertension Sister   ? ? ?Objective: ?Office vital signs reviewed. ?BP (!) 144/91   Pulse 94   Temp 97.7 ?F (36.5 ?C)   Resp 20   Ht 5\' 1"  (1.549 m)   Wt 162 lb (73.5 kg)   SpO2 96%   BMI 30.61 kg/m?  ? ?Physical Examination:  ?General: Awake, alert, well nourished, No acute distress ?MSK: Ambulating independently ? Left hip: 5/5 lower extremity strength.  Light touch sensation grossly intact.  Negative straight leg raise.  Negative FABER and negative FADIR ? Right hip: 5/5 lower extremity strength.  Light touch sensation grossly intact.  Negative straight leg raise.  Negative FABER and negative FADIR ? ?Assessment/ Plan: ?71 y.o. female  ? ?Strain of flexor muscle of left hip, initial encounter ? ?Strain of flexor muscle of right hip, initial encounter ? ?Suspect hip flexor strain/tendinitis.  This is likely related to her change in activity.  I recommended standing desk and decreased sitting.  Her physical exam did not demonstrate any concerning for intra-articular hip involvement nor herniated disc of her back.  Okay to continue Epsom salt soaks, oral NSAID and I have given her home physical therapy stretches ? ? ?No orders of the defined types were placed in this encounter. ? ?No orders of the defined types were placed in this encounter. ? ? ? ?66, DO ?[facility] ?[phone] ? ? ?

## 2021-11-10 ENCOUNTER — Other Ambulatory Visit: Payer: Self-pay | Admitting: Family Medicine

## 2021-11-14 ENCOUNTER — Other Ambulatory Visit: Payer: Self-pay | Admitting: Family Medicine

## 2021-11-14 DIAGNOSIS — I10 Essential (primary) hypertension: Secondary | ICD-10-CM

## 2021-11-17 ENCOUNTER — Other Ambulatory Visit: Payer: Self-pay | Admitting: Family Medicine

## 2022-02-05 ENCOUNTER — Encounter: Payer: Self-pay | Admitting: Family Medicine

## 2022-02-05 ENCOUNTER — Other Ambulatory Visit: Payer: Self-pay | Admitting: Family Medicine

## 2022-02-05 NOTE — Telephone Encounter (Signed)
[facility] NTBS for 6 mos FU. Mail order NOT sent

## 2022-02-05 NOTE — Telephone Encounter (Signed)
[facility] TO SCHEDULE APPT LETTER MAILED 

## 2022-02-08 ENCOUNTER — Other Ambulatory Visit: Payer: Self-pay | Admitting: Family Medicine

## 2022-02-08 DIAGNOSIS — I10 Essential (primary) hypertension: Secondary | ICD-10-CM

## 2022-02-10 ENCOUNTER — Ambulatory Visit: Payer: Medicare Other | Admitting: Family Medicine

## 2022-02-12 ENCOUNTER — Other Ambulatory Visit: Payer: Self-pay | Admitting: Family Medicine

## 2022-02-25 ENCOUNTER — Encounter: Payer: Self-pay | Admitting: Family Medicine

## 2022-02-25 ENCOUNTER — Ambulatory Visit (INDEPENDENT_AMBULATORY_CARE_PROVIDER_SITE_OTHER): Payer: Medicare Other | Admitting: Family Medicine

## 2022-02-25 ENCOUNTER — Ambulatory Visit (INDEPENDENT_AMBULATORY_CARE_PROVIDER_SITE_OTHER): Payer: Medicare Other

## 2022-02-25 VITALS — BP 132/91 | HR 79 | Temp 97.7°F | Ht 61.0 in | Wt 155.0 lb

## 2022-02-25 DIAGNOSIS — E034 Atrophy of thyroid (acquired): Secondary | ICD-10-CM | POA: Diagnosis not present

## 2022-02-25 DIAGNOSIS — F5101 Primary insomnia: Secondary | ICD-10-CM

## 2022-02-25 DIAGNOSIS — J3089 Other allergic rhinitis: Secondary | ICD-10-CM | POA: Diagnosis not present

## 2022-02-25 DIAGNOSIS — M79651 Pain in right thigh: Secondary | ICD-10-CM | POA: Diagnosis not present

## 2022-02-25 DIAGNOSIS — M47816 Spondylosis without myelopathy or radiculopathy, lumbar region: Secondary | ICD-10-CM | POA: Diagnosis not present

## 2022-02-25 DIAGNOSIS — H60542 Acute eczematoid otitis externa, left ear: Secondary | ICD-10-CM | POA: Diagnosis not present

## 2022-02-25 DIAGNOSIS — F411 Generalized anxiety disorder: Secondary | ICD-10-CM

## 2022-02-25 DIAGNOSIS — F410 Panic disorder [episodic paroxysmal anxiety] without agoraphobia: Secondary | ICD-10-CM

## 2022-02-25 DIAGNOSIS — M545 Low back pain, unspecified: Secondary | ICD-10-CM | POA: Diagnosis not present

## 2022-02-25 MED ORDER — LEVOCETIRIZINE DIHYDROCHLORIDE 5 MG PO TABS: 5.0000 mg | ORAL_TABLET | Freq: Every evening | ORAL | 3 refills | Status: DC | PRN

## 2022-02-25 MED ORDER — LORAZEPAM 0.5 MG PO TABS: 0.5000 mg | ORAL_TABLET | Freq: Every evening | ORAL | 1 refills | Status: DC | PRN

## 2022-02-25 MED ORDER — AZELASTINE HCL 0.1 % NA SOLN: 1.0000 | Freq: Two times a day (BID) | NASAL | 12 refills | Status: DC

## 2022-02-25 NOTE — Patient Instructions (Addendum)
Hydrocortisone cream applied with tip of finger up to twice daily as needed for itching/ irritation Astelin and xyzal sent to pharmacy for nasal symptoms.  Use of a nasal saline gel may help with irritation as well Recommend physical therapy for right leg symptoms.  Our records indicate that you are due for your annual mammogram/breast imaging. While there is no way to prevent breast cancer, early detection provides the best opportunity for curing it. For women over the age of 22, the American Cancer Society recommends a yearly clinical breast exam and a yearly mammogram. These practices have saved thousands of lives. We need your help to ensure that you are receiving optimal medical care. Please call the imaging location that has done you previous mammograms. Please remember to list [place] as your primary care. This helps make sure we receive a report and can update your chart.  Below is the contact information for several local breast imaging centers. You may call the location that works best for you, and they will be happy to assistance in making you an appointment. You do not need an order for a regular screening mammogram. However, if you are having any problems or concerns with you breast area, please let your primary care provider know, and appropriate orders will be placed. Please let our office know if you have any questions or concerns. Or if you need information for another imaging center not on this list or outside of the area. We are commented to working with you on your health care journey.   The mobile unit/bus ([facility]) - they come twice a month to our location.  These appointments can be made through our office or by call The Breast Center  [facility]  [address] [place], [place] [address] Phone [phone]  [facility] Radiology Department  [address] [place], [place] [address] [phone]  [facility] ([facility] of [facility])  [id number], [place] [address] [phone]  [facility]  [address] [place] [place] [address] [phone]  [facility]  [address] [place] [address] [phone]  [facility] [place]  [address] [place], [place] [address] [phone]  [facility] [address] [place], [place] [address] [phone]  [facility] [address] [place], [place] [address] [phone]  [facility] [place], [place] [address] [phone]

## 2022-02-25 NOTE — Progress Notes (Signed)
Subjective: CC: Chronic follow-up PCP: [name], DO HPI:Catherine Mayer is a 71 y.o. female presenting to clinic today for:  1.  Insomnia Patient continues to use the Ativan as needed for anxiety or sleep.  No reports of excessive daytime sedation or respiratory depression  2.  Right thigh pain Patient reports that she had about a 6 to 70-month history of right thigh pain that comes at random but is always related to her getting up from a seated position.  She reports a cramping sensation that resulted in weakness and makes her feel like she is going to go down.  She denies any history of back injury.  No reports of sensory changes, changes in bowel or bladder habits.  3.  Ear discomfort Patient reports some left-sided ear irritation.  She reports that she frequently scratches that ear with her finger.  No reports of dizziness or drainage from the ear  4.  Right nare irritation Patient reports irritation of her right nostril.  It burns.  She does admit to frequent drainage.  She is not currently taking any oral antihistamines or using any nasal sprays.  They recently mowed around her home and she wonders if perhaps this flared it up.   ROS: Per HPI  No Known Allergies Past Medical History:  Diagnosis Date   Anxiety    Arthritis    hands, knees   Depression    GERD (gastroesophageal reflux disease)    Hyperlipidemia    Hypertension    Hypothyroidism    Thyroid disease    Trimalleolar fracture of ankle, closed, right, initial encounter     Current Outpatient Medications:    amLODipine (NORVASC) 5 MG tablet, TAKE 1 TABLET BY MOUTH DAILY, Disp: 100 tablet, Rfl: 0   diclofenac Sodium (VOLTAREN) 1 % GEL, , Disp: , Rfl:    escitalopram (LEXAPRO) 20 MG tablet, TAKE 1 TABLET BY MOUTH  DAILY, Disp: 90 tablet, Rfl: 0   levothyroxine (SYNTHROID) 137 MCG tablet, TAKE 1 TABLET BY MOUTH DAILY  BEFORE BREAKFAST, Disp: 100 tablet, Rfl: 1   LORazepam (ATIVAN) 0.5 MG tablet, Take 1  tablet (0.5 mg total) by mouth at bedtime as needed for anxiety (place on HOLD. pt does not need yet.)., Disp: 90 tablet, Rfl: 1   meloxicam (MOBIC) 15 MG tablet, Take 15 mg by mouth daily as needed for pain., Disp: , Rfl:    omeprazole (PRILOSEC) 20 MG capsule, TAKE 1 CAPSULE BY MOUTH DAILY, Disp: 100 capsule, Rfl: 0   pravastatin (PRAVACHOL) 80 MG tablet, TAKE 1 TABLET BY MOUTH DAILY, Disp: 100 tablet, Rfl: 0   triamcinolone cream (KENALOG) 0.1 %, APPLY TOPICALLY TWICE DAILY FOR 7 TO 10 DAYS, Disp: 30 g, Rfl: 0   valsartan-hydrochlorothiazide (DIOVAN-HCT) 160-25 MG tablet, TAKE 1 TABLET BY MOUTH  DAILY, Disp: 90 tablet, Rfl: 0 Social History   Socioeconomic History   Marital status: Widowed    Spouse name: [name]   Number of children: 1   Years of education: Not on file   Highest education level: Associate degree: occupational, Scientist, product/process development, or vocational program  Occupational History   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    Social Determinants of Health   Financial Resource Strain: Low Risk  (08/29/2020)   Overall Financial Resource Strain (CARDIA)    Difficulty of Paying Living Expenses: Not hard at all  Food Insecurity: No Food Insecurity (08/30/2021)   Hunger Vital Sign    Worried About Running Out of Food in the Last Year: Never true    Ran Out of Food in the Last Year: Never true  Transportation Needs: Unknown (08/30/2021)   PRAPARE - Administrator, Civil Service (Medical): No    Lack of Transportation (Non-Medical): Not on file  Physical Activity: Sufficiently Active (08/30/2021)   Exercise Vital Sign     Days of Exercise per Week: 5 days    Minutes of Exercise per Session: 60 min  Stress: No Stress Concern Present (08/30/2021)   [employer] of Occupational Health - Occupational Stress Questionnaire    Feeling of Stress : Only a little  Social Connections: Moderately Integrated (08/30/2021)   Social Connection and Isolation Panel [NHANES]    Frequency of Communication with Friends and Family: More than three times a week    Frequency of Social Gatherings with Friends and Family: More than three times a week    Attends Religious Services: More than 4 times per year    Active Member of [employer] or Organizations: Yes    Attends Banker Meetings: More than 4 times per year    Marital Status: Widowed  Intimate Partner Violence: Not At Risk (08/30/2021)   Humiliation, Afraid, Rape, and Kick questionnaire    Fear of Current or Ex-Partner: No    Emotionally Abused: No    Physically Abused: No    Sexually Abused: No   Family History  Problem Relation Age of Onset   Hypertension Mother    Hyperlipidemia Mother    Hypertension Father    Hypertension Sister    Hypertension Son    Hyperlipidemia Son    Hypertension Sister    Hypertension Sister     Objective: Office vital signs reviewed. BP (!) 132/91   Pulse 79   Temp 97.7 F (36.5 C) (Temporal)   Ht 5\' 1"  (1.549 m)   Wt 155 lb (70.3 kg)   SpO2 95%   BMI 29.29 kg/m   Physical Examination:  General: Awake, alert, nontoxic-appearing female, No acute distress HEENT: Normal    Neck: No masses palpated. No lymphadenopathy    Ears: Bilateral tympanic membranes are intact with normal light reflex.  She has some mild hyperpigmentation and inflammation noted at the 5 o'clock position of the left external auditory canal.  No drainage     Nose: nasal turbinates moist, clear nasal discharge mild erythema within the right nare Cardio: regular rate and rhythm, S1S2 heard, no murmurs appreciated Pulm: clear to auscultation  bilaterally, no wheezes, rhonchi or rales; normal work of breathing on room air MSK: Ambulating independently  Lumbar spine: No midline tenderness to palpation.  Negative straight leg raise. 5/5 LE strength bilaterally  Assessment/ Plan: 71 y.o. female   Musculoskeletal thigh pain, right - Plan: DG Lumbar Spine 2-3 Views, Ambulatory referral to Physical Therapy  Non-seasonal allergic rhinitis due to other allergic trigger - Plan: levocetirizine (XYZAL) 5 MG tablet, azelastine (ASTELIN) 0.1 % nasal spray  Dermatitis of left ear canal  Hypothyroidism due to acquired atrophy of thyroid - Plan: TSH, T4, Free  Generalized anxiety disorder with panic attacks - Plan: LORazepam (ATIVAN) 0.5 MG tablet  Primary insomnia - Plan: LORazepam (ATIVAN) 0.5 MG tablet  Plain films obtained of the back.  Referral to physical therapy.  Wonder if perhaps she is having spasm associated with some type of degenerative change in the lumbar spine.  No focal findings on exam today except for some mild hypertonicity of the muscles in the posterior thigh  For her drainage, no evidence of infection.  Xyzal and Astelin prescribed.  Left ear canal consistent with dermatitis.  Discussed use of OTC hydrocortisone cream applied to the affected area up to twice daily as needed  Check thyroid levels.  Continue thyroid medications  Continue Ativan as needed.  This is been renewed.  She is up-to-date on UDS and CSC.  The national narcotic database was reviewed and there were no red flags  No orders of the defined types were placed in this encounter.  No orders of the defined types were placed in this encounter.    [name], DO [facility] [phone]

## 2022-02-26 LAB — TSH: TSH: 0.147 u[IU]/mL — ABNORMAL LOW (ref 0.450–4.500)

## 2022-02-26 LAB — T4, FREE: Free T4: 2.15 ng/dL — ABNORMAL HIGH (ref 0.82–1.77)

## 2022-02-28 NOTE — Progress Notes (Signed)
Patient returning call. Please call back at [phone]

## 2022-03-18 ENCOUNTER — Telehealth: Payer: Self-pay | Admitting: Family Medicine

## 2022-03-18 NOTE — Telephone Encounter (Signed)
Ii called [facility] and its not that they are out of stock of ativan its due to her not get meds from this in several years and they will not accept her as a new customer due to being on a controlled substance, pharmacy says she just couldn't tell the pt that. I called pt and let her know [facility] was not an option and she states she will just tough it out since she has to go back to work tomorrow and doesn't want to drive to another pharmacy. I told pt if she changed her mind to give [place] a call

## 2022-03-18 NOTE — Telephone Encounter (Signed)
I can send the 1mg  and she can break in half (make sure they have this)  or I can send to another pharmacy if she can find it.  I think [facility] was keeping that one in stock.  Let me know what she wants to do

## 2022-04-17 NOTE — Patient Instructions (Signed)
Our records indicate that you are due for your annual mammogram/breast imaging. While there is no way to prevent breast cancer, early detection provides the best opportunity for curing it. For women over the age of 40, the American Cancer Society recommends a yearly clinical breast exam and a yearly mammogram. These practices have saved thousands of lives. We need your help to ensure that you are receiving optimal medical care. Please call the imaging location that has done you previous mammograms. Please remember to list us as your primary care. This helps make sure we receive a report and can update your chart.  Below is the contact information for several local breast imaging centers. You may call the location that works best for you, and they will be happy to assistance in making you an appointment. You do not need an order for a regular screening mammogram. However, if you are having any problems or concerns with you breast area, please let your primary care provider know, and appropriate orders will be placed. Please let our office know if you have any questions or concerns. Or if you need information for another imaging center not on this list or outside of the area. We are commented to working with you on your health care journey.   The mobile unit/bus ([facility]) - they come twice a month to our location.  These appointments can be made through our office or by call The Breast Center  [facility]  [address] [place], [place] [address] Phone [phone]  [facility] Radiology Department  [address]  [place], [place] [address] [phone]  [facility]  [address] [place], [place] [address] [phone]  [facility] [place]  [address] [place] [address] [phone]  [facility] [place]  [address] [place] [address] [phone]  [facility] [place]  [address] [place], [place] [address] [phone]  [facility] [address] [place], [place] [address] [phone]  [facility] [address] [place], [place] [address] [phone]  [facility] [address] [place] [address] [phone]     

## 2022-04-22 ENCOUNTER — Encounter: Payer: Self-pay | Admitting: Family Medicine

## 2022-04-22 ENCOUNTER — Ambulatory Visit (INDEPENDENT_AMBULATORY_CARE_PROVIDER_SITE_OTHER): Payer: Medicare Other | Admitting: Family Medicine

## 2022-04-22 ENCOUNTER — Ambulatory Visit (INDEPENDENT_AMBULATORY_CARE_PROVIDER_SITE_OTHER): Payer: Medicare Other

## 2022-04-22 VITALS — BP 136/86 | HR 92 | Temp 98.0°F | Ht 61.0 in | Wt 155.8 lb

## 2022-04-22 DIAGNOSIS — M85852 Other specified disorders of bone density and structure, left thigh: Secondary | ICD-10-CM

## 2022-04-22 DIAGNOSIS — M79651 Pain in right thigh: Secondary | ICD-10-CM

## 2022-04-22 DIAGNOSIS — I10 Essential (primary) hypertension: Secondary | ICD-10-CM | POA: Diagnosis not present

## 2022-04-22 DIAGNOSIS — M609 Myositis, unspecified: Secondary | ICD-10-CM | POA: Diagnosis not present

## 2022-04-22 DIAGNOSIS — M8588 Other specified disorders of bone density and structure, other site: Secondary | ICD-10-CM

## 2022-04-22 DIAGNOSIS — T466X5A Adverse effect of antihyperlipidemic and antiarteriosclerotic drugs, initial encounter: Secondary | ICD-10-CM | POA: Diagnosis not present

## 2022-04-22 DIAGNOSIS — Z780 Asymptomatic menopausal state: Secondary | ICD-10-CM | POA: Diagnosis not present

## 2022-04-22 NOTE — Progress Notes (Signed)
Subjective: CC: Follow-up back pain/thigh pain PCP: Janora Norlander, DO HPI:Catherine Mayer is a 71 y.o. female presenting to clinic today for:  1.  Right posterior thigh pain Patient reports resolution of this after she went to every other day dosing of Pravachol 20 mg.  Of note her x-rays did show degenerative changes with facet arthropathy in the lumbar spine.  She is feeling well and not having any issues with pain now.    ROS: Per HPI  No Known Allergies Past Medical History:  Diagnosis Date   Anxiety    Arthritis    hands, knees   Depression    GERD (gastroesophageal reflux disease)    Hyperlipidemia    Hypertension    Hypothyroidism    Thyroid disease    Trimalleolar fracture of ankle, closed, right, initial encounter     Current Outpatient Medications:    amLODipine (NORVASC) 5 MG tablet, TAKE 1 TABLET BY MOUTH DAILY, Disp: 100 tablet, Rfl: 0   azelastine (ASTELIN) 0.1 % nasal spray, Place 1 spray into both nostrils 2 (two) times daily. For runny nose, Disp: 30 mL, Rfl: 12   diclofenac Sodium (VOLTAREN) 1 % GEL, , Disp: , Rfl:    escitalopram (LEXAPRO) 20 MG tablet, TAKE 1 TABLET BY MOUTH  DAILY, Disp: 90 tablet, Rfl: 0   levocetirizine (XYZAL) 5 MG tablet, Take 1 tablet (5 mg total) by mouth at bedtime as needed for allergies., Disp: 90 tablet, Rfl: 3   levothyroxine (SYNTHROID) 137 MCG tablet, TAKE 1 TABLET BY MOUTH DAILY  BEFORE BREAKFAST, Disp: 100 tablet, Rfl: 1   LORazepam (ATIVAN) 0.5 MG tablet, Take 1 tablet (0.5 mg total) by mouth at bedtime as needed for anxiety (place on HOLD. pt does not need yet.)., Disp: 90 tablet, Rfl: 1   meloxicam (MOBIC) 15 MG tablet, Take 15 mg by mouth daily as needed for pain., Disp: , Rfl:    omeprazole (PRILOSEC) 20 MG capsule, TAKE 1 CAPSULE BY MOUTH DAILY, Disp: 100 capsule, Rfl: 0   pravastatin (PRAVACHOL) 80 MG tablet, TAKE 1 TABLET BY MOUTH DAILY, Disp: 100 tablet, Rfl: 0   triamcinolone cream (KENALOG) 0.1 %, APPLY TOPICALLY  TWICE DAILY FOR 7 TO 10 DAYS, Disp: 30 g, Rfl: 0   valsartan-hydrochlorothiazide (DIOVAN-HCT) 160-25 MG tablet, TAKE 1 TABLET BY MOUTH  DAILY, Disp: 90 tablet, Rfl: 0 Social History   Socioeconomic History   Marital status: Widowed    Spouse name: Elenore Rota   Number of children: 1   Years of education: Not on file   Highest education level: Associate degree: occupational, Hotel manager, or vocational program  Occupational History   Occupation: Economist    Comment: part time  Tobacco Use   Smoking status: Never   Smokeless tobacco: Never  Vaping Use   Vaping Use: Never used  Substance and Sexual Activity   Alcohol use: Not Currently   Drug use: Never   Sexual activity: Not on file  Other Topics Concern   Not on file  Social History Narrative   Husband passed away 2020-10-26   Enjoys playing with dogs and being outside.    Social Determinants of Health   Financial Resource Strain: Low Risk  (08/29/2020)   Overall Financial Resource Strain (CARDIA)    Difficulty of Paying Living Expenses: Not hard at all  Food Insecurity: No Food Insecurity (08/30/2021)   Hunger Vital Sign    Worried About Running Out of Food in the Last Year: Never true  Ran Out of Food in the Last Year: Never true  Transportation Needs: Unknown (08/30/2021)   PRAPARE - Administrator, Civil Service (Medical): No    Lack of Transportation (Non-Medical): Not on file  Physical Activity: Sufficiently Active (08/30/2021)   Exercise Vital Sign    Days of Exercise per Week: 5 days    Minutes of Exercise per Session: 60 min  Stress: No Stress Concern Present (08/30/2021)   [employer] of Occupational Health - Occupational Stress Questionnaire    Feeling of Stress : Only a little  Social Connections: Moderately Integrated (08/30/2021)   Social Connection and Isolation Panel [NHANES]    Frequency of Communication with Friends and Family: More than three times a week    Frequency of Social Gatherings  with Friends and Family: More than three times a week    Attends Religious Services: More than 4 times per year    Active Member of [employer] or Organizations: Yes    Attends Banker Meetings: More than 4 times per year    Marital Status: Widowed  Intimate Partner Violence: Not At Risk (08/30/2021)   Humiliation, Afraid, Rape, and Kick questionnaire    Fear of Current or Ex-Partner: No    Emotionally Abused: No    Physically Abused: No    Sexually Abused: No   Family History  Problem Relation Age of Onset   Hypertension Mother    Hyperlipidemia Mother    Hypertension Father    Hypertension Sister    Hypertension Son    Hyperlipidemia Son    Hypertension Sister    Hypertension Sister     Objective: Office vital signs reviewed. BP 136/86   Pulse 92   Temp 98 F (36.7 C)   Ht 5\' 1"  (1.549 m)   Wt 155 lb 12.8 oz (70.7 kg)   SpO2 94%   BMI 29.44 kg/m   Physical Examination:  General: Awake, alert, well nourished, No acute distress MSK: Ambulating independently with normal gait and station  Assessment/ Plan: 71 y.o. female   Musculoskeletal thigh pain, right  Statin-induced myositis  Essential hypertension with goal blood pressure less than 130/80  Osteopenia of left femoral neck - Plan: DG [facility] DEXA  Resolved with every other day dosing of statin.  I have denoted statin induced myositis  Blood pressure within acceptable range upon recheck  DEXA scan ordered to follow-up osteopenia noted in left femoral neck a couple of years ago  Orders Placed This Encounter  Procedures   DG [facility] DEXA    Order Specific Question:   Reason for Exam (SYMPTOM  OR DIAGNOSIS REQUIRED)    Answer:   screen osteoporosis   No orders of the defined types were placed in this encounter.    62, DO [facility] [phone]

## 2022-05-02 ENCOUNTER — Other Ambulatory Visit: Payer: Self-pay | Admitting: Family Medicine

## 2022-05-02 DIAGNOSIS — I10 Essential (primary) hypertension: Secondary | ICD-10-CM

## 2022-05-06 ENCOUNTER — Other Ambulatory Visit: Payer: Self-pay | Admitting: Family Medicine

## 2022-05-06 ENCOUNTER — Encounter: Payer: Self-pay | Admitting: Family Medicine

## 2022-05-06 DIAGNOSIS — E034 Atrophy of thyroid (acquired): Secondary | ICD-10-CM

## 2022-05-06 NOTE — Telephone Encounter (Signed)
Have pt come in for labs only, orders are in. Refills have been sent

## 2022-05-06 NOTE — Telephone Encounter (Signed)
Letter sent.

## 2022-06-23 IMAGING — CR DG ANKLE COMPLETE 3+V*R*
3 series · 3 of 3 positions shown · non-contrast
Comparison: None.

CLINICAL DATA: Tripped and fell today injuring the right ankle.

EXAM:
RIGHT ANKLE - COMPLETE 3+ VIEW

[x ankle ap right]
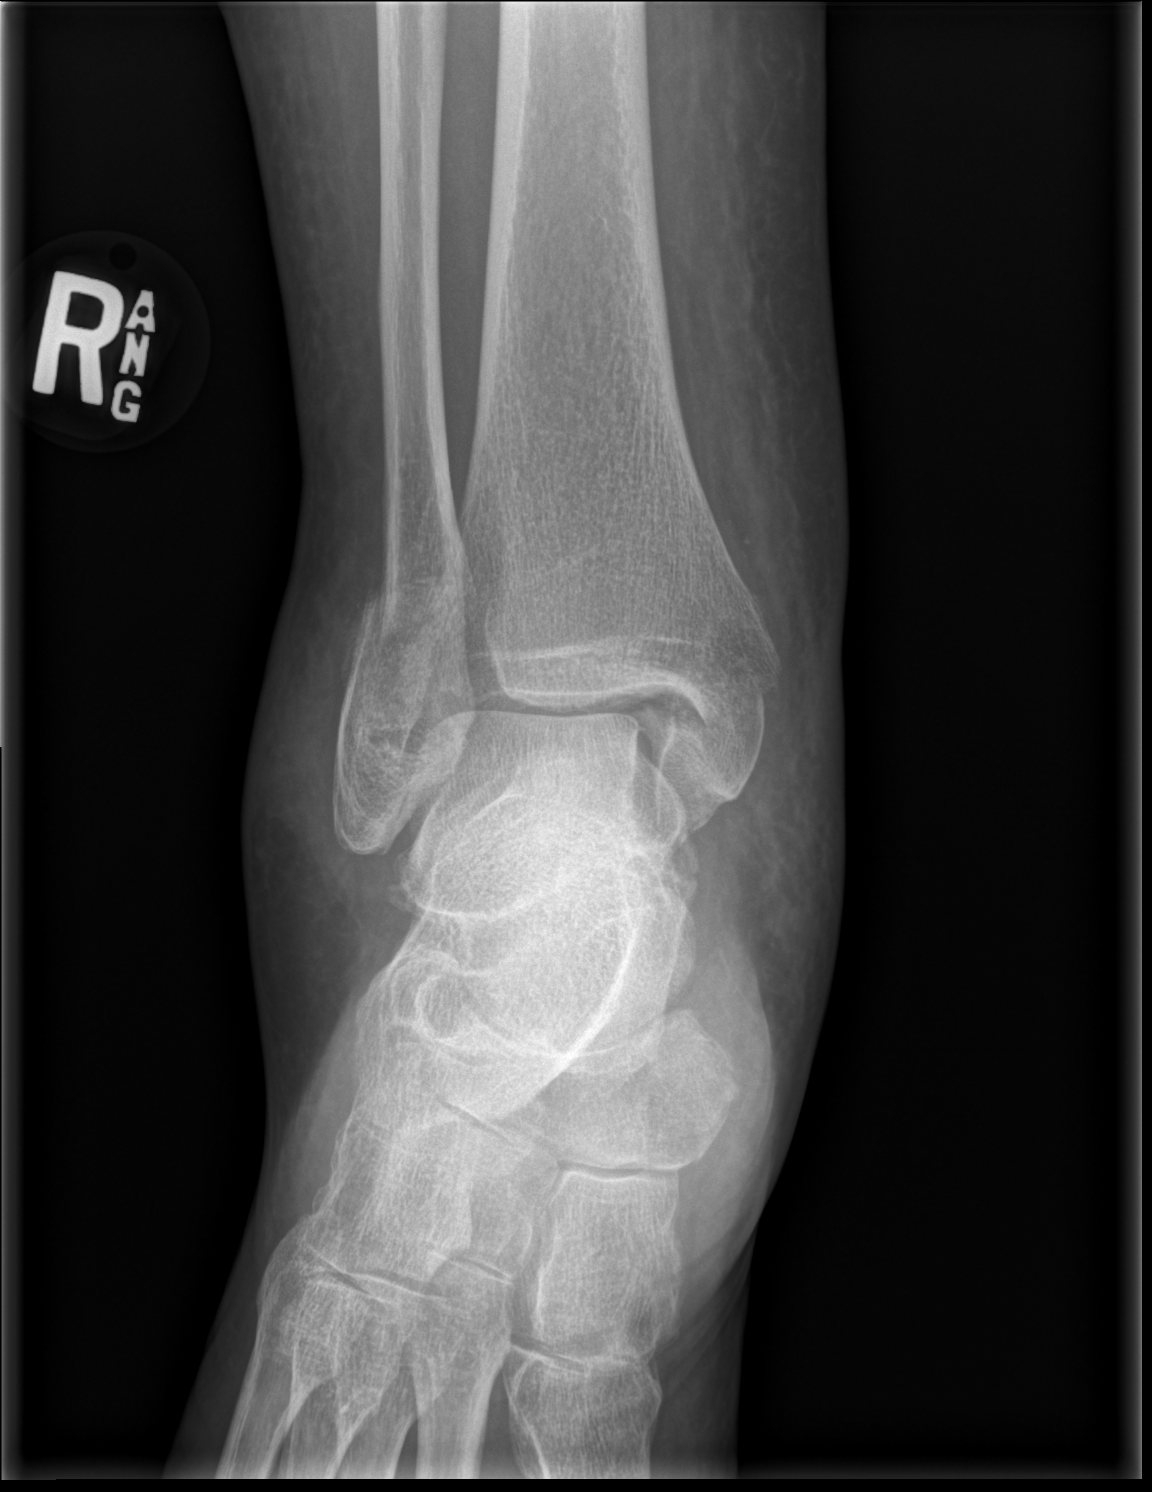

[x ankle obl right]
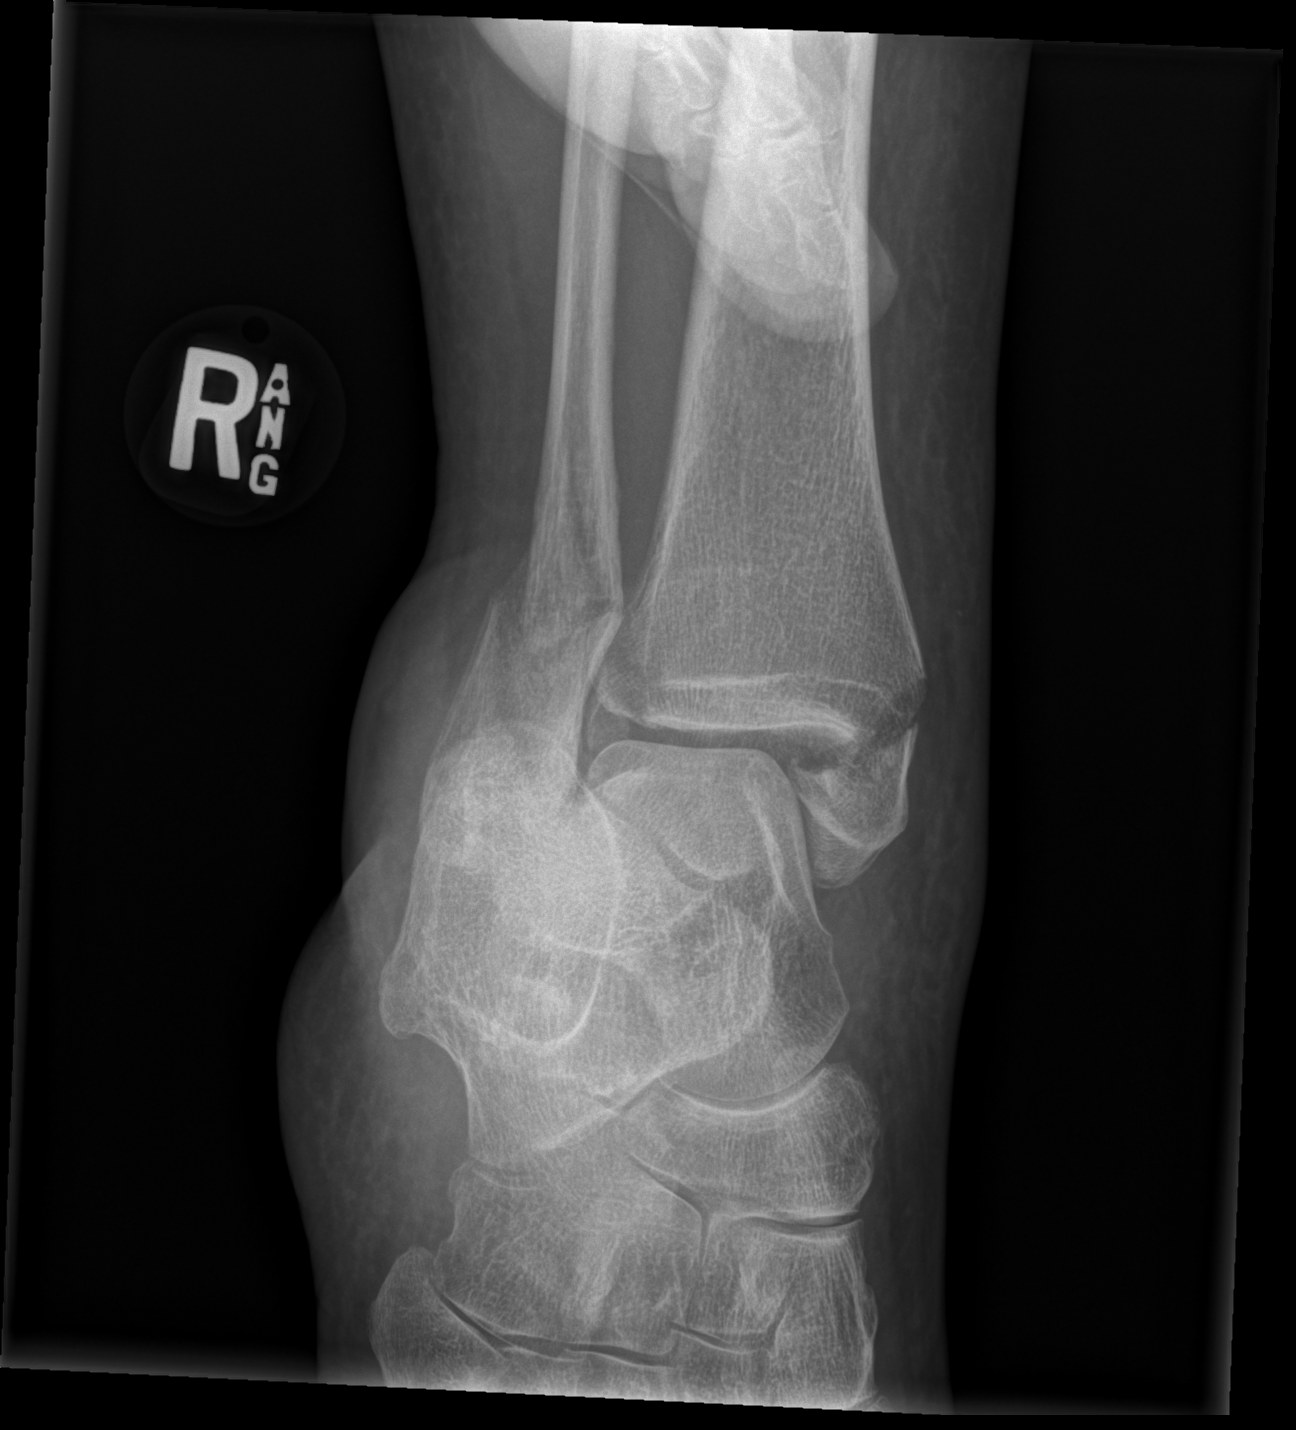

[x ankle lat right]
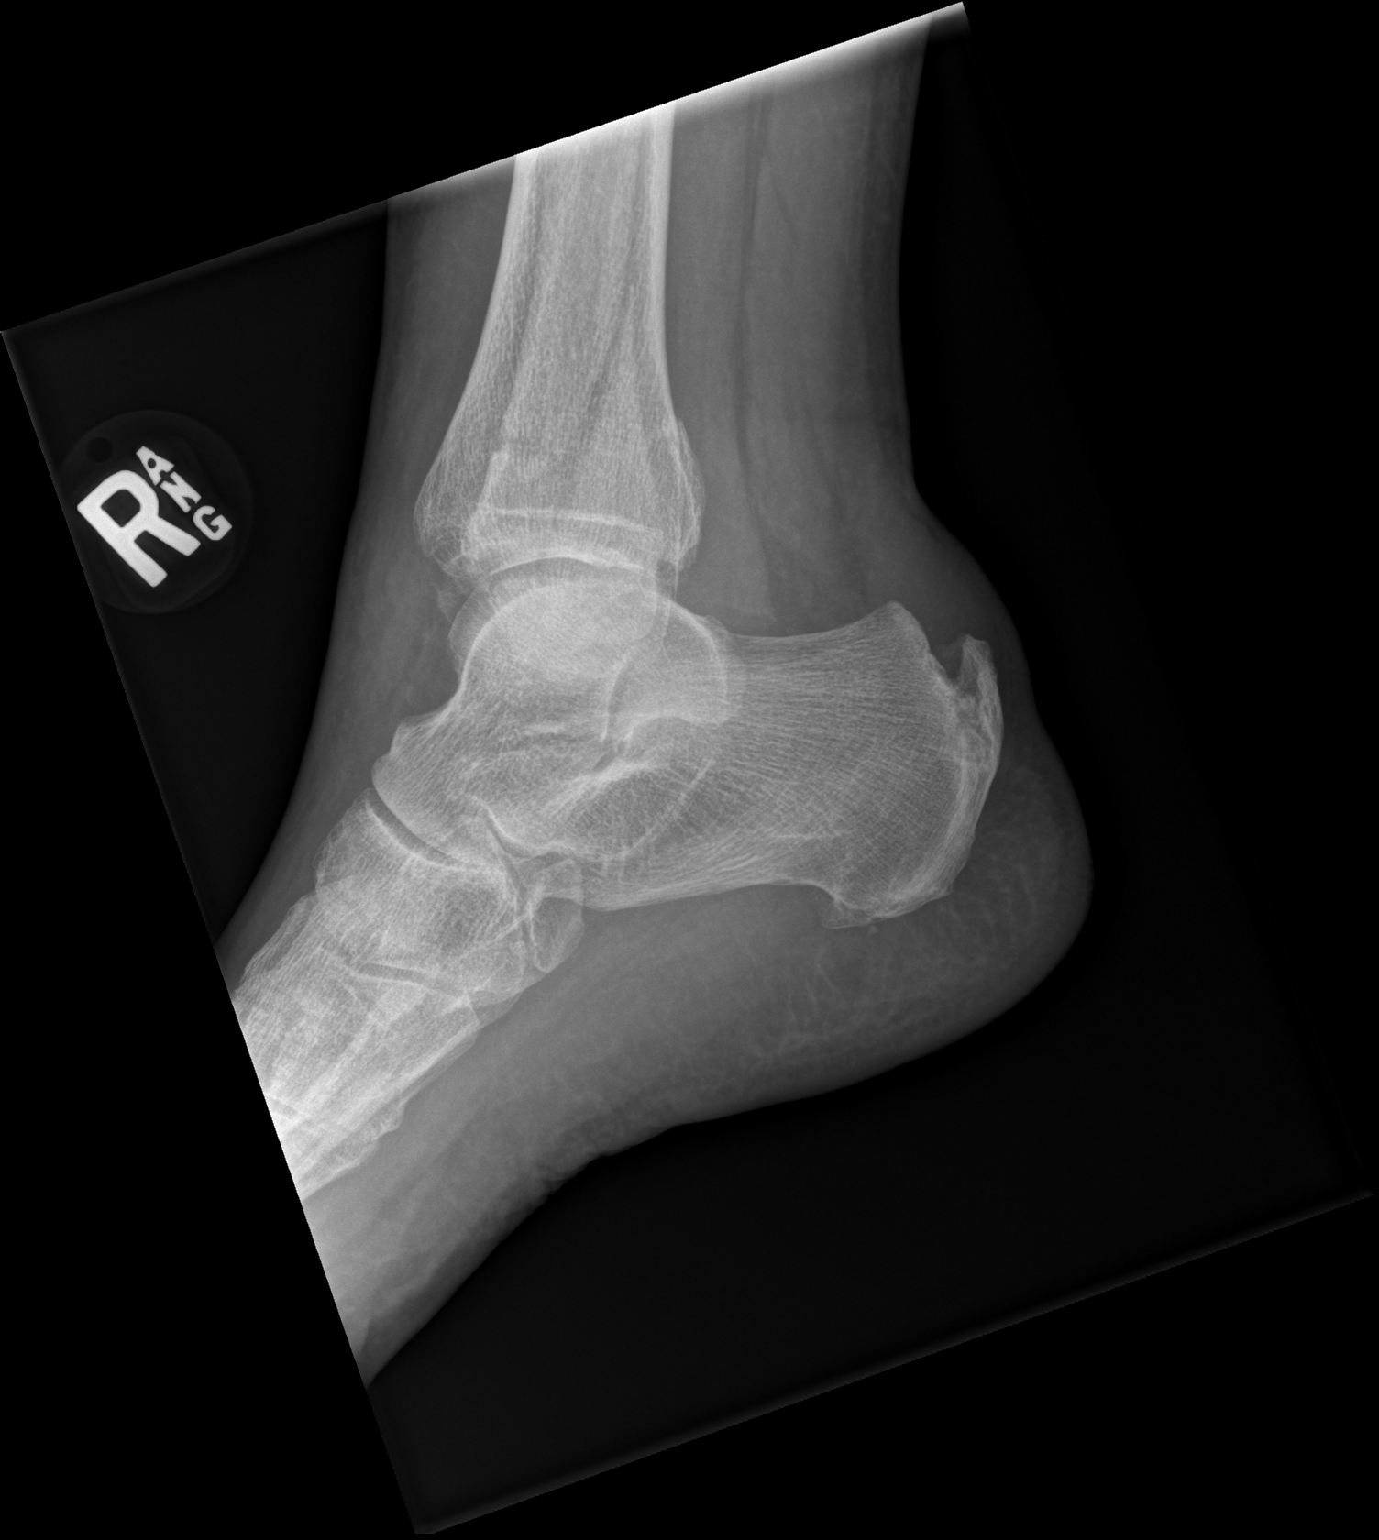

[3 of 3 positions shown; findings below may reference images not displayed]

FINDINGS: Oblique transverse fracture across the base of the medial malleolus.
Oblique mildly comminuted fracture of the distal fibula extending
from the metadiaphysis to the distal metaphysis crossing the ankle
joint. There is a probable, but not definitive, posterior malleolar
fracture.

Medial malleolar fracture is displaced medially by 8 mm. Talus is
subluxed laterally to a similar degree. Distal fibular fracture is
mildly displaced and angulated laterally.

There is surrounding soft tissue swelling.
IMPRESSION: 1. Displaced fractures of the medial malleolus and distal fibula as
detailed with lateral talar subluxation of approximately 8 mm. There
is a probable, but not definitive, posterior malleolar fracture.

## 2022-07-03 DIAGNOSIS — R051 Acute cough: Secondary | ICD-10-CM | POA: Diagnosis not present

## 2022-07-03 DIAGNOSIS — J0100 Acute maxillary sinusitis, unspecified: Secondary | ICD-10-CM | POA: Diagnosis not present

## 2022-07-09 ENCOUNTER — Encounter: Payer: Self-pay | Admitting: Nurse Practitioner

## 2022-07-09 ENCOUNTER — Telehealth (INDEPENDENT_AMBULATORY_CARE_PROVIDER_SITE_OTHER): Payer: Medicare Other | Admitting: Nurse Practitioner

## 2022-07-09 DIAGNOSIS — R051 Acute cough: Secondary | ICD-10-CM | POA: Diagnosis not present

## 2022-07-09 MED ORDER — BENZONATATE 100 MG PO CAPS: 100.0000 mg | ORAL_CAPSULE | Freq: Two times a day (BID) | ORAL | 0 refills | Status: DC | PRN

## 2022-07-09 NOTE — Progress Notes (Signed)
Virtual Visit Consent   Catherine Mayer, you are scheduled for a virtual visit with Mary-Margaret Hassell Done, [place], a [facility] provider, today.     Just as with appointments in the office, your consent must be obtained to participate.  Your consent will be active for this visit and any virtual visit you may have with one of our providers in the next 365 days.     If you have a MyChart account, a copy of this consent can be sent to you electronically.  All virtual visits are billed to your insurance company just like a traditional visit in the office.    As this is a virtual visit, video technology does not allow for your provider to perform a traditional examination.  This may limit your provider's ability to fully assess your condition.  If your provider identifies any concerns that need to be evaluated in person or the need to arrange testing (such as labs, EKG, etc.), we will make arrangements to do so.     Although advances in technology are sophisticated, we cannot ensure that it will always work on either your end or our end.  If the connection with a video visit is poor, the visit may have to be switched to a telephone visit.  With either a video or telephone visit, we are not always able to ensure that we have a secure connection.     I need to obtain your verbal consent now.   Are you willing to proceed with your visit today? YES   Catherine Mayer has provided verbal consent on 07/09/2022 for a virtual visit (video or telephone).   Mary-Margaret Hassell Done, FNP   Date: 07/09/2022 2:44 PM   Virtual Visit via Video Note   I, Mary-Margaret Hassell Done, connected with Catherine Mayer ([id number], [birth year]) on 07/09/22 at  2:45 PM EST by a video-enabled telemedicine application and verified that I am speaking with the correct person using two identifiers.  Location: Patient: Virtual Visit Location Patient: Home Provider: Virtual Visit Location Provider: Mobile   I discussed the limitations of evaluation and  management by telemedicine and the availability of in person appointments. The patient expressed understanding and agreed to proceed.    History of Present Illness: Catherine Mayer is a 72 y.o. who identifies as a female who was assigned female at birth, and is being seen today for cough.  HPI: She was seen by urgent 1 week ago and was dx with sinusitis and was given z pak , steroid taper and tessalon perles. She got better but cannot get rid of cough.  Cough This is a new problem. The current episode started 1 to 4 weeks ago. The problem has been waxing and waning. The problem occurs constantly. The cough is Productive of sputum. Pertinent negatives include no ear congestion, ear pain, nasal congestion, rhinorrhea or shortness of breath. Nothing aggravates the symptoms. She has tried OTC cough suppressant (mucinex) for the symptoms. The treatment provided mild relief.    Review of Systems  HENT:  Negative for ear pain and rhinorrhea.   Respiratory:  Positive for cough. Negative for shortness of breath.     Problems:  Patient Active Problem List   Diagnosis Date Noted   Osteopenia of left femoral neck 04/22/2022   Vaginal intraepithelial neoplasia 08/30/2021   Closed trimalleolar fracture of right ankle 06/15/2020   Generalized anxiety disorder with panic attacks 05/18/2019   Panic disorder 09/13/2018   Anxiety, generalized 09/13/2018   Essential hypertension with goal  blood pressure less than 130/80 07/22/2016   Hyperlipidemia 07/22/2016   Recurrent major depressive disorder, in full remission ([place]) 07/22/2016   Vitamin D deficiency 07/22/2016   Choking 12/29/2013   Hypothyroidism 12/19/2013    Allergies: No Known Allergies Medications:  Current Outpatient Medications:    amLODipine (NORVASC) 5 MG tablet, TAKE 1 TABLET BY MOUTH DAILY, Disp: 90 tablet, Rfl: 1   azelastine (ASTELIN) 0.1 % nasal spray, Place 1 spray into both nostrils 2 (two) times daily. For runny nose, Disp: 30 mL, Rfl:  12   diclofenac Sodium (VOLTAREN) 1 % GEL, , Disp: , Rfl:    escitalopram (LEXAPRO) 20 MG tablet, TAKE 1 TABLET BY MOUTH  DAILY, Disp: 90 tablet, Rfl: 0   levocetirizine (XYZAL) 5 MG tablet, Take 1 tablet (5 mg total) by mouth at bedtime as needed for allergies., Disp: 90 tablet, Rfl: 3   levothyroxine (SYNTHROID) 137 MCG tablet, TAKE 1 TABLET BY MOUTH DAILY  BEFORE BREAKFAST, Disp: 100 tablet, Rfl: 0   LORazepam (ATIVAN) 0.5 MG tablet, Take 1 tablet (0.5 mg total) by mouth at bedtime as needed for anxiety (place on HOLD. pt does not need yet.)., Disp: 90 tablet, Rfl: 1   meloxicam (MOBIC) 15 MG tablet, Take 15 mg by mouth daily as needed for pain., Disp: , Rfl:    omeprazole (PRILOSEC) 20 MG capsule, TAKE 1 CAPSULE BY MOUTH DAILY, Disp: 100 capsule, Rfl: 0   pravastatin (PRAVACHOL) 80 MG tablet, TAKE 1 TABLET BY MOUTH DAILY, Disp: 100 tablet, Rfl: 0   triamcinolone cream (KENALOG) 0.1 %, APPLY TOPICALLY TWICE DAILY FOR 7 TO 10 DAYS, Disp: 30 g, Rfl: 0   valsartan-hydrochlorothiazide (DIOVAN-HCT) 160-25 MG tablet, TAKE 1 TABLET BY MOUTH  DAILY, Disp: 90 tablet, Rfl: 0  Observations/Objective: Patient is well-developed, well-nourished in no acute distress.  Resting comfortably  at home.  Head is normocephalic, atraumatic.  No labored breathing.  Speech is clear and coherent with logical content.  Patient is alert and oriented at baseline.  Dry cough  Assessment and Plan:  Catherine Mayer in today with chief complaint of Cough   1. Acute cough 1. Take meds as prescribed 2. Use a cool mist humidifier especially during the winter months and when heat has been humid. 3. Use saline nose sprays frequently 4. Saline irrigations of the nose can be very helpful if done frequently.  * 4X daily for 1 week*  * Use of a nettie pot can be helpful with this. Follow directions with this* 5. Drink plenty of fluids 6. Keep thermostat turn down low 7.For any cough or congestion- tessalon perles. 8. For  fever or aces or pains- take tylenol or ibuprofen appropriate for age and weight.  * for fevers greater than 101 orally you may alternate ibuprofen and tylenol every  3 hours.   Meds ordered this encounter  Medications   benzonatate (TESSALON) 100 MG capsule    Sig: Take 1 capsule (100 mg total) by mouth 2 (two) times daily as needed for cough.    Dispense:  20 capsule    Refill:  0    Order Specific Question:   Supervising Provider    Answer:   Caryl Pina A [[id number]]      Follow Up Instructions: I discussed the assessment and treatment plan with the patient. The patient was provided an opportunity to ask questions and all were answered. The patient agreed with the plan and demonstrated an understanding of the instructions.  A copy of  instructions were sent to the patient via [place].  The patient was advised to call back or seek an in-person evaluation if the symptoms worsen or if the condition fails to improve as anticipated.  Time:  I spent 6 minutes with the patient via telehealth technology discussing the above problems/concerns.    Mary-Margaret Hassell Done, FNP

## 2022-07-09 NOTE — Patient Instructions (Signed)

## 2022-07-11 ENCOUNTER — Other Ambulatory Visit: Payer: Self-pay | Admitting: Family Medicine

## 2022-07-11 DIAGNOSIS — I10 Essential (primary) hypertension: Secondary | ICD-10-CM

## 2022-07-14 ENCOUNTER — Telehealth: Payer: Self-pay | Admitting: Family Medicine

## 2022-07-14 NOTE — Telephone Encounter (Signed)
NA- WE DO NOT HAVE ANY OPENINGS, PT WILL NEED TO SEE ON CALL PROVIDER

## 2022-07-15 ENCOUNTER — Other Ambulatory Visit: Payer: Self-pay | Admitting: Family Medicine

## 2022-08-06 ENCOUNTER — Encounter: Payer: Self-pay | Admitting: Family Medicine

## 2022-08-10 ENCOUNTER — Other Ambulatory Visit: Payer: Self-pay | Admitting: Family Medicine

## 2022-08-11 ENCOUNTER — Telehealth: Payer: Self-pay | Admitting: Family Medicine

## 2022-08-11 ENCOUNTER — Encounter: Payer: Self-pay | Admitting: Family Medicine

## 2022-08-11 NOTE — Telephone Encounter (Signed)
Made appt for 04/17. Called pt no answer. Will mail appt reminder

## 2022-08-11 NOTE — Telephone Encounter (Signed)
[place] NTBS in April for 6 mos FU. Refill SENT to pharmacy

## 2022-08-25 ENCOUNTER — Telehealth: Payer: Self-pay | Admitting: Family Medicine

## 2022-08-25 NOTE — Telephone Encounter (Signed)
Left message to call back  

## 2022-09-02 ENCOUNTER — Encounter: Payer: Self-pay | Admitting: Nurse Practitioner

## 2022-09-02 ENCOUNTER — Ambulatory Visit (INDEPENDENT_AMBULATORY_CARE_PROVIDER_SITE_OTHER): Payer: Medicare Other

## 2022-09-02 VITALS — Ht 61.0 in | Wt 155.0 lb

## 2022-09-02 DIAGNOSIS — Z1211 Encounter for screening for malignant neoplasm of colon: Secondary | ICD-10-CM

## 2022-09-02 DIAGNOSIS — Z0000 Encounter for general adult medical examination without abnormal findings: Secondary | ICD-10-CM

## 2022-09-02 DIAGNOSIS — Z1231 Encounter for screening mammogram for malignant neoplasm of breast: Secondary | ICD-10-CM

## 2022-09-02 NOTE — Progress Notes (Signed)
Subjective:   Catherine Mayer is a 72 y.o. female who presents for Medicare Annual (Subsequent) preventive examination. I connected with  Catherine Mayer on 09/02/22 by a audio enabled telemedicine application and verified that I am speaking with the correct person using two identifiers.  Patient Location: Home  Provider Location: Home Office  I discussed the limitations of evaluation and management by telemedicine. The patient expressed understanding and agreed to proceed.  Review of Systems     Cardiac Risk Factors include: advanced age (>55mn, >>32women);hypertension     Objective:    Today's Vitals   09/02/22 0915  Weight: 155 lb (70.3 kg)  Height: '5\' 1"'$  (1.549 m)   Body mass index is 29.29 kg/m.     09/02/2022    9:18 AM 08/30/2021    9:43 AM 08/29/2020   10:01 AM 06/14/2020   11:46 AM 06/11/2020   11:36 AM 06/10/2020    4:16 PM 08/29/2019    9:44 AM  Advanced Directives  Does Patient Have a Medical Advance Directive? Yes No No No No No No  Type of AParamedicof AThayerLiving will        Copy of [place] Chart? No - copy requested        Would patient like information on creating a medical advance directive?  No - Patient declined No - Patient declined No - Patient declined No - Patient declined No - Patient declined No - Patient declined    Current Medications (verified) Outpatient Encounter Medications as of 09/02/2022  Medication Sig   amLODipine (NORVASC) 5 MG tablet TAKE 1 TABLET BY MOUTH DAILY   azelastine (ASTELIN) 0.1 % nasal spray Place 1 spray into both nostrils 2 (two) times daily. For runny nose   benzonatate (TESSALON) 100 MG capsule Take 1 capsule (100 mg total) by mouth 2 (two) times daily as needed for cough.   diclofenac Sodium (VOLTAREN) 1 % GEL    escitalopram (LEXAPRO) 20 MG tablet TAKE 1 TABLET BY MOUTH DAILY   levocetirizine (XYZAL) 5 MG tablet Take 1 tablet (5 mg total) by mouth at bedtime as needed for  allergies.   levothyroxine (SYNTHROID) 137 MCG tablet TAKE 1 TABLET BY MOUTH DAILY  BEFORE BREAKFAST   LORazepam (ATIVAN) 0.5 MG tablet Take 1 tablet (0.5 mg total) by mouth at bedtime as needed for anxiety (place on HOLD. pt does not need yet.).   meloxicam (MOBIC) 15 MG tablet Take 15 mg by mouth daily as needed for pain.   omeprazole (PRILOSEC) 20 MG capsule TAKE 1 CAPSULE BY MOUTH DAILY   pravastatin (PRAVACHOL) 80 MG tablet TAKE 1 TABLET BY MOUTH DAILY   triamcinolone cream (KENALOG) 0.1 % APPLY TOPICALLY TWICE DAILY FOR 7 TO 10 DAYS   valsartan-hydrochlorothiazide (DIOVAN-HCT) 160-25 MG tablet TAKE 1 TABLET BY MOUTH DAILY   No facility-administered encounter medications on file as of 09/02/2022.    Allergies (verified) Patient has no known allergies.   History: Past Medical History:  Diagnosis Date   Anxiety    Arthritis    hands, knees   Depression    GERD (gastroesophageal reflux disease)    Hyperlipidemia    Hypertension    Hypothyroidism    Thyroid disease    Trimalleolar fracture of ankle, closed, right, initial encounter    Past Surgical History:  Procedure Laterality Date   ABDOMINAL HYSTERECTOMY     CARPAL TUNNEL RELEASE Bilateral 2013   CHOLECYSTECTOMY  ORIF ANKLE FRACTURE Right 06/14/2020   Procedure: Open treatment right ankle trimalleolar fracture with internal fixation;  Surgeon: Wylene Simmer, MD;  Location: [place];  Service: Orthopedics;  Laterality: Right;  28mn   Family History  Problem Relation Age of Onset   Hypertension Mother    Hyperlipidemia Mother    Hypertension Father    Hypertension Sister    Hypertension Son    Hyperlipidemia Son    Hypertension Sister    Hypertension Sister    Social History   Socioeconomic History   Marital status: Widowed    Spouse name: DElenore Rota  Number of children: 1   Years of education: Not on file   Highest education level: Associate degree: occupational, tHotel manager or vocational  program  Occupational History   Occupation: fEconomist   Comment: part time  Tobacco Use   Smoking status: Never   Smokeless tobacco: Never  Vaping Use   Vaping Use: Never used  Substance and Sexual Activity   Alcohol use: Not Currently   Drug use: Never   Sexual activity: Not on file  Other Topics Concern   Not on file  Social History Narrative   Husband passed away 22022-03-24  Enjoys playing with dogs and being outside.    Social Determinants of Health   Financial Resource Strain: Low Risk  (09/02/2022)   Overall Financial Resource Strain (CARDIA)    Difficulty of Paying Living Expenses: Not hard at all  Food Insecurity: No Food Insecurity (09/02/2022)   Hunger Vital Sign    Worried About Running Out of Food in the Last Year: Never true    Ran Out of Food in the Last Year: Never true  Transportation Needs: No Transportation Needs (09/02/2022)   PRAPARE - THydrologist(Medical): No    Lack of Transportation (Non-Medical): No  Physical Activity: Insufficiently Active (09/02/2022)   Exercise Vital Sign    Days of Exercise per Week: 4 days    Minutes of Exercise per Session: 30 min  Stress: No Stress Concern Present (09/02/2022)   [place]   Feeling of Stress : Not at all  Social Connections: Moderately Isolated (09/02/2022)   Social Connection and Isolation Panel [NHANES]    Frequency of Communication with Friends and Family: More than three times a week    Frequency of Social Gatherings with Friends and Family: More than three times a week    Attends Religious Services: More than 4 times per year    Active Member of [employer] Organizations: No    Attends CArchivistMeetings: Never    Marital Status: Widowed    Tobacco Counseling Counseling given: Not Answered   Clinical Intake:  Pre-visit preparation completed: Yes  Pain : No/denies pain     Nutritional  Risks: None Diabetes: No  How often do you need to have someone help you when you read instructions, pamphlets, or other written materials from your doctor or pharmacy?: 1 - Never  Diabetic?no   Interpreter Needed?: No  Information entered by :: LJadene Pierini LPN   Activities of Daily Living    09/02/2022    9:18 AM  In your present state of health, do you have any difficulty performing the following activities:  Hearing? 0  Vision? 0  Difficulty concentrating or making decisions? 0  Walking or climbing stairs? 0  Dressing or bathing? 0  Doing errands, shopping?  0  Preparing Food and eating ? N  Using the Toilet? N  In the past six months, have you accidently leaked urine? N  Do you have problems with loss of bowel control? N  Managing your Medications? N  Managing your Finances? N  Housekeeping or managing your Housekeeping? N    Patient Care Team: Janora Norlander, DO as PCP - General (Family Medicine) Meisinger, Sherren Mocha, MD as Consulting Physician (Obstetrics and Gynecology)  Indicate any recent Medical Services you may have received from other than [facility] providers in the past year (date may be approximate).     Assessment:   This is a routine wellness examination for [place].  Hearing/Vision screen Vision Screening - Comments:: Wears rx glasses - up to date with routine eye exams with  Dr.Johnson   Dietary issues and exercise activities discussed: Current Exercise Habits: Home exercise routine, Type of exercise: walking, Time (Minutes): 30, Frequency (Times/Week): 3, Weekly Exercise (Minutes/Week): 90, Intensity: Mild, Exercise limited by: None identified   Goals Addressed             This Visit's Progress    DIET - INCREASE WATER INTAKE   On track      Depression Screen    09/02/2022    9:17 AM 04/22/2022    1:17 PM 02/25/2022    2:27 PM 09/24/2021    3:21 PM 08/30/2021    9:41 AM 08/27/2021    8:18 AM 02/26/2021    9:27 AM  PHQ 2/9 Scores  PHQ - 2 Score  0 '1 2 1 1 1 4  '$ PHQ- 9 Score  '2 6  4 5 12    '$ Fall Risk    09/02/2022    9:16 AM 04/22/2022    1:17 PM 02/25/2022    2:26 PM 09/24/2021    3:21 PM 08/30/2021    9:40 AM  Fall Risk   Falls in the past year? 0 0 0 0 0  Number falls in past yr: 0    0  Injury with Fall? 0    0  Risk for fall due to : No Fall Risks    Orthopedic patient  Follow up Falls prevention discussed    Falls prevention discussed    [place]:  Any stairs in or around the home? No  If so, are there any without handrails? No  Home free of loose throw rugs in walkways, pet beds, electrical cords, etc? Yes  Adequate lighting in your home to reduce risk of falls? Yes   ASSISTIVE DEVICES UTILIZED TO PREVENT FALLS:  Life alert? No  Use of a cane, walker or w/c? No  Grab bars in the bathroom? Yes  Shower chair or bench in shower? Yes  Elevated toilet seat or a handicapped toilet? Yes        09/02/2022    9:18 AM 08/29/2020    9:53 AM 08/29/2019    9:48 AM  6CIT Screen  What Year? 0 points 0 points 0 points  What month? 0 points 0 points 0 points  What time? 0 points 0 points 0 points  Count back from 20 0 points 0 points 0 points  Months in reverse 0 points 0 points 0 points  Repeat phrase 0 points 0 points 0 points  Total Score 0 points 0 points 0 points    Immunizations Immunization History  Administered Date(s) Administered   Influenza, High Dose Seasonal PF 07/22/2016, 03/18/2017, 03/22/2019   Influenza,inj,Quad  PF,6+ Mos 05/10/2018   Influenza,inj,quad, With Preservative 04/06/2017   Moderna SARS-COV2 Booster Vaccination 09/12/2020   Moderna Sars-Covid-2 Vaccination 08/31/2019, 09/28/2019   Pneumococcal Conjugate-13 07/22/2016   Pneumococcal Polysaccharide-23 05/18/2019   Tdap 02/13/2015    TDAP status: Up to date  Flu Vaccine status: Up to date  Pneumococcal vaccine status: Up to date  Covid-19 vaccine status: Completed vaccines  Qualifies for Shingles  Vaccine? Yes   Zostavax completed No   Shingrix Completed?: No.    Education has been provided regarding the importance of this vaccine. Patient has been advised to call insurance company to determine out of pocket expense if they have not yet received this vaccine. Advised may also receive vaccine at local pharmacy or Health Dept. Verbalized acceptance and understanding.  Screening Tests Health Maintenance  Topic Date Due   Zoster Vaccines- Shingrix (1 of 2) Never done   COVID-19 Vaccine (3 - Moderna risk series) 10/10/2020   INFLUENZA VACCINE  10/05/2022 (Originally 02/04/2022)   MAMMOGRAM  04/23/2023 (Originally 06/19/2022)   COLONOSCOPY (Pts 45-19yr Insurance coverage will need to be confirmed)  04/23/2023 (Originally 01/19/1996)   Medicare Annual Wellness ([place]len [place]ll[place]  09/03/2023   DEXA SCAN  04/22/2024   DTaP/Tdap/Td (2 - Td or Tdap) 02/12/2025   Pneumonia Vaccine 72 Years old  Completed   Hepatitis C Screening  Completed   HPV VACCINES  Aged Out    Health Maintenance  Health Maintenance Due  Topic Date Due   Zoster Vaccines- Shingrix (1 of 2) Never done   COVID-19 Vaccine (3 - Moderna risk series) 10/10/2020    Colorectal cancer screening: Referral to GI placed 09/02/2022. Pt aware the office will call re: appt.  Mammogram status: Ordered 09/02/2022. Pt provided with contact info and advised to call to schedule appt.   Bone Density status: Completed 04/19/2022. Results reflect: Bone density results: OSTEOPOROSIS. Repeat every 2 years.  Lung Cancer Screening: (Low Dose CT Chest recommended if Age 72-80years, 30 pack-year currently smoking OR have quit w/in 15years.) does not qualify.   Lung Cancer Screening Referral: n/a  Additional Screening:  Hepatitis C Screening: does not qualify; Completed 07/22/2016  Vision Screening: Recommended annual ophthalmology exams for early detection of glaucoma and other disorders of the eye. Is the patient up to date with their annual eye  exam?  Yes  Who is the provider or what is the name of the office in which the patient attends annual eye exams? Dr.Johnson  If pt is not established with a provider, would they like to be referred to a provider to establish care? No .   Dental Screening: Recommended annual dental exams for proper oral hygiene  Community Resource Referral / Chronic Care Management: CRR required this visit?  No   CCM required this visit?  No      Plan:     I have personally reviewed and noted the following in the patient's chart:   Medical and social history Use of alcohol, tobacco or illicit drugs  Current medications and supplements including opioid prescriptions. Patient is not currently taking opioid prescriptions. Functional ability and status Nutritional status Physical activity Advanced directives List of other physicians Hospitalizations, surgeries, and ER visits in previous 12 months Vitals Screenings to include cognitive, depression, and falls Referrals and appointments  In addition, I have reviewed and discussed with patient certain preventive protocols, quality metrics, and best practice recommendations. A written personalized care plan for preventive services as well as general preventive health recommendations were provided to  patient.     Daphane Shepherd, LPN   [id number]   Nurse Notes: none

## 2022-09-02 NOTE — Patient Instructions (Signed)
Catherine Mayer , Thank you for taking time to come for your Medicare Wellness Visit. I appreciate your ongoing commitment to your health goals. Please review the following plan we discussed and let me know if I can assist you in the future.   These are the goals we discussed:  Goals      DIET - INCREASE WATER INTAKE     Patient Stated     08/29/2019 AWV Goal: Fall Prevention  Over the next year, patient will decrease their risk for falls by: Using assistive devices, such as a cane or walker, as needed Identifying fall risks within their home and correcting them by: Removing throw rugs Adding handrails to stairs or ramps Removing clutter and keeping a clear pathway throughout the home Increasing light, especially at night Adding shower handles/bars Raising toilet seat Identifying potential personal risk factors for falls: Medication side effects Incontinence/urgency Vestibular dysfunction Hearing loss Musculoskeletal disorders Neurological disorders Orthostatic hypotension  08/29/2019 AWV Goal: Exercise for General Health  Patient will verbalize understanding of the benefits of increased physical activity: Exercising regularly is important. It will improve your overall fitness, flexibility, and endurance. Regular exercise also will improve your overall health. It can help you control your weight, reduce stress, and improve your bone density. Over the next year, patient will increase physical activity as tolerated with a goal of at least 150 minutes of moderate physical activity per week.  You can tell that you are exercising at a moderate intensity if your heart starts beating faster and you start breathing faster but can still hold a conversation. Moderate-intensity exercise ideas include: Walking 1 mile (1.6 km) in about 15 minutes Biking Hiking Golfing Dancing Water aerobics Patient will verbalize understanding of everyday activities that increase physical activity by providing  examples like the following: Yard work, such as: Sales promotion account executive Gardening Washing windows or floors Patient will be able to explain general safety guidelines for exercising:  Before you start a new exercise program, talk with your health care provider. Do not exercise so much that you hurt yourself, feel dizzy, or get very short of breath. Wear comfortable clothes and wear shoes with good support. Drink plenty of water while you exercise to prevent dehydration or heat stroke. Work out until your breathing and your heartbeat get faster.      Patient Stated     Wants to go on a cruise        This is a list of the screening recommended for you and due dates:  Health Maintenance  Topic Date Due   Zoster (Shingles) Vaccine (1 of 2) Never done   COVID-19 Vaccine (3 - Moderna risk series) 10/10/2020   Flu Shot  10/05/2022*   Mammogram  04/23/2023*   Colon Cancer Screening  04/23/2023*   Medicare Annual Wellness Visit  09/03/2023   DEXA scan (bone density measurement)  04/22/2024   DTaP/Tdap/Td vaccine (2 - Td or Tdap) 02/12/2025   Pneumonia Vaccine  Completed   Hepatitis C Screening: USPSTF Recommendation to screen - Ages 25-79 yo.  Completed   HPV Vaccine  Aged Out  *Topic was postponed. The date shown is not the original due date.    Advanced directives: Advance directive discussed with you today. I have provided a copy for you to complete at home and have notarized. Once this is complete please bring a copy in to our office so we can scan it  into your chart.   Conditions/risks identified: Aim for 30 minutes of exercise or brisk walking, 6-8 glasses of water, and 5 servings of fruits and vegetables each day.   Next appointment: Follow up in one year for your annual wellness visit    Preventive Care 65 Years and Older, Female Preventive care refers to lifestyle choices and visits with your  health care provider that can promote health and wellness. What does preventive care include? A yearly physical exam. This is also called an annual well check. Dental exams once or twice a year. Routine eye exams. Ask your health care provider how often you should have your eyes checked. Personal lifestyle choices, including: Daily care of your teeth and gums. Regular physical activity. Eating a healthy diet. Avoiding tobacco and drug use. Limiting alcohol use. Practicing safe sex. Taking low-dose aspirin every day. Taking vitamin and mineral supplements as recommended by your health care provider. What happens during an annual well check? The services and screenings done by your health care provider during your annual well check will depend on your age, overall health, lifestyle risk factors, and family history of disease. Counseling  Your health care provider may ask you questions about your: Alcohol use. Tobacco use. Drug use. Emotional well-being. Home and relationship well-being. Sexual activity. Eating habits. History of falls. Memory and ability to understand (cognition). Work and work Statistician. Reproductive health. Screening  You may have the following tests or measurements: Height, weight, and BMI. Blood pressure. Lipid and cholesterol levels. These may be checked every 5 years, or more frequently if you are over 73 years old. Skin check. Lung cancer screening. You may have this screening every year starting at age 95 if you have a 30-pack-year history of smoking and currently smoke or have quit within the past 15 years. Fecal occult blood test (FOBT) of the stool. You may have this test every year starting at age 12. Flexible sigmoidoscopy or colonoscopy. You may have a sigmoidoscopy every 5 years or a colonoscopy every 10 years starting at age 70. Hepatitis C blood test. Hepatitis B blood test. Sexually transmitted disease (STD) testing. Diabetes screening. This  is done by checking your blood sugar (glucose) after you have not eaten for a while (fasting). You may have this done every 1-3 years. Bone density scan. This is done to screen for osteoporosis. You may have this done starting at age 57. Mammogram. This may be done every 1-2 years. Talk to your health care provider about how often you should have regular mammograms. Talk with your health care provider about your test results, treatment options, and if necessary, the need for more tests. Vaccines  Your health care provider may recommend certain vaccines, such as: Influenza vaccine. This is recommended every year. Tetanus, diphtheria, and acellular pertussis (Tdap, Td) vaccine. You may need a Td booster every 10 years. Zoster vaccine. You may need this after age 32. Pneumococcal 13-valent conjugate (PCV13) vaccine. One dose is recommended after age 75. Pneumococcal polysaccharide (PPSV23) vaccine. One dose is recommended after age 36. Talk to your health care provider about which screenings and vaccines you need and how often you need them. This information is not intended to replace advice given to you by your health care provider. Make sure you discuss any questions you have with your health care provider. Document Released: 07/20/2015 Document Revised: 03/12/2016 Document Reviewed: 04/24/2015 Elsevier Interactive Patient Education  2017 [place] Prevention in the Home Falls can cause injuries. They can  happen to people of all ages. There are many things you can do to make your home safe and to help prevent falls. What can I do on the outside of my home? Regularly fix the edges of walkways and driveways and fix any cracks. Remove anything that might make you trip as you walk through a door, such as a raised step or threshold. Trim any bushes or trees on the path to your home. Use bright outdoor lighting. Clear any walking paths of anything that might make someone trip, such as  rocks or tools. Regularly check to see if handrails are loose or broken. Make sure that both sides of any steps have handrails. Any raised decks and porches should have guardrails on the edges. Have any leaves, snow, or ice cleared regularly. Use sand or salt on walking paths during winter. Clean up any spills in your garage right away. This includes oil or grease spills. What can I do in the bathroom? Use night lights. Install grab bars by the toilet and in the tub and shower. Do not use towel bars as grab bars. Use non-skid mats or decals in the tub or shower. If you need to sit down in the shower, use a plastic, non-slip stool. Keep the floor dry. Clean up any water that spills on the floor as soon as it happens. Remove soap buildup in the tub or shower regularly. Attach bath mats securely with double-sided non-slip rug tape. Do not have throw rugs and other things on the floor that can make you trip. What can I do in the bedroom? Use night lights. Make sure that you have a light by your bed that is easy to reach. Do not use any sheets or blankets that are too big for your bed. They should not hang down onto the floor. Have a firm chair that has side arms. You can use this for support while you get dressed. Do not have throw rugs and other things on the floor that can make you trip. What can I do in the kitchen? Clean up any spills right away. Avoid walking on wet floors. Keep items that you use a lot in easy-to-reach places. If you need to reach something above you, use a strong step stool that has a grab bar. Keep electrical cords out of the way. Do not use floor polish or wax that makes floors slippery. If you must use wax, use non-skid floor wax. Do not have throw rugs and other things on the floor that can make you trip. What can I do with my stairs? Do not leave any items on the stairs. Make sure that there are handrails on both sides of the stairs and use them. Fix handrails  that are broken or loose. Make sure that handrails are as long as the stairways. Check any carpeting to make sure that it is firmly attached to the stairs. Fix any carpet that is loose or worn. Avoid having throw rugs at the top or bottom of the stairs. If you do have throw rugs, attach them to the floor with carpet tape. Make sure that you have a light switch at the top of the stairs and the bottom of the stairs. If you do not have them, ask someone to add them for you. What else can I do to help prevent falls? Wear shoes that: Do not have high heels. Have rubber bottoms. Are comfortable and fit you well. Are closed at the toe. Do not wear sandals.  If you use a stepladder: Make sure that it is fully opened. Do not climb a closed stepladder. Make sure that both sides of the stepladder are locked into place. Ask someone to hold it for you, if possible. Clearly mark and make sure that you can see: Any grab bars or handrails. First and last steps. Where the edge of each step is. Use tools that help you move around (mobility aids) if they are needed. These include: Canes. Walkers. Scooters. Crutches. Turn on the lights when you go into a dark area. Replace any light bulbs as soon as they burn out. Set up your furniture so you have a clear path. Avoid moving your furniture around. If any of your floors are uneven, fix them. If there are any pets around you, be aware of where they are. Review your medicines with your doctor. Some medicines can make you feel dizzy. This can increase your chance of falling. Ask your doctor what other things that you can do to help prevent falls. This information is not intended to replace advice given to you by your health care provider. Make sure you discuss any questions you have with your health care provider. Document Released: 04/19/2009 Document Revised: 11/29/2015 Document Reviewed: 07/28/2014 Elsevier Interactive Patient Education  2017 [employer].

## 2022-09-03 ENCOUNTER — Telehealth (INDEPENDENT_AMBULATORY_CARE_PROVIDER_SITE_OTHER): Payer: Medicare Other | Admitting: Family Medicine

## 2022-09-03 ENCOUNTER — Encounter: Payer: Self-pay | Admitting: Family Medicine

## 2022-09-03 DIAGNOSIS — I10 Essential (primary) hypertension: Secondary | ICD-10-CM

## 2022-09-03 DIAGNOSIS — Z8249 Family history of ischemic heart disease and other diseases of the circulatory system: Secondary | ICD-10-CM

## 2022-09-03 DIAGNOSIS — E782 Mixed hyperlipidemia: Secondary | ICD-10-CM

## 2022-09-03 DIAGNOSIS — J40 Bronchitis, not specified as acute or chronic: Secondary | ICD-10-CM | POA: Diagnosis not present

## 2022-09-03 MED ORDER — BENZONATATE 100 MG PO CAPS: 100.0000 mg | ORAL_CAPSULE | Freq: Two times a day (BID) | ORAL | 0 refills | Status: DC | PRN

## 2022-09-03 MED ORDER — CEFDINIR 300 MG PO CAPS: 300.0000 mg | ORAL_CAPSULE | Freq: Two times a day (BID) | ORAL | 0 refills | Status: DC

## 2022-09-03 MED ORDER — PROMETHAZINE-DM 6.25-15 MG/5ML PO SYRP: 2.5000 mL | ORAL_SOLUTION | Freq: Four times a day (QID) | ORAL | 0 refills | Status: DC | PRN

## 2022-09-03 NOTE — Progress Notes (Signed)
Telephone visit  Subjective: CC: cough PCP: Janora Norlander, DO HPI:Catherine Mayer is a 72 y.o. female calls for telephone consult today. Patient provides verbal consent for consult held via phone.  Due to COVID-19 pandemic this visit was conducted virtually. This visit type was conducted due to national recommendations for restrictions regarding the COVID-19 Pandemic (e.g. social distancing, sheltering in place) in an effort to limit this patient's exposure and mitigate transmission in our community. All issues noted in this document were discussed and addressed.  A physical exam was not performed with this format.   Location of patient: home Location of provider: [facility] Others present for call: none  1. Cough Patient reports productive cough with drainage that started on Sunday.  She denies hemoptysis.  She has yellow sputum.  She was seen at urgent care 2 months ago.  She had a fever on Monday and it finally broke. Home test for COVID negative.  No known sick contacts that she knows of.  No wheezing or shortness of breath.  She is using pseudofed cold/ sinus, vit c.  She is using astelin and xyzal.     2.  Family history of heart disease Patient notes family history of heart attack in her father.  She is never had a "heart evaluation" and because she has hyperlipidemia and hypertension she is very interested in pursuing this.  No reports of chest pain or other cardiovascular issues but she just wants to be sure that she is doing everything that she can to maintain a healthy life  ROS: Per HPI  No Known Allergies Past Medical History:  Diagnosis Date   Anxiety    Arthritis    hands, knees   Depression    GERD (gastroesophageal reflux disease)    Hyperlipidemia    Hypertension    Hypothyroidism    Thyroid disease    Trimalleolar fracture of ankle, closed, right, initial encounter     Current Outpatient Medications:    amLODipine (NORVASC) 5 MG tablet, TAKE 1 TABLET BY MOUTH DAILY,  Disp: 90 tablet, Rfl: 1   azelastine (ASTELIN) 0.1 % nasal spray, Place 1 spray into both nostrils 2 (two) times daily. For runny nose, Disp: 30 mL, Rfl: 12   benzonatate (TESSALON) 100 MG capsule, Take 1 capsule (100 mg total) by mouth 2 (two) times daily as needed for cough., Disp: 20 capsule, Rfl: 0   diclofenac Sodium (VOLTAREN) 1 % GEL, , Disp: , Rfl:    escitalopram (LEXAPRO) 20 MG tablet, TAKE 1 TABLET BY MOUTH DAILY, Disp: 100 tablet, Rfl: 0   levocetirizine (XYZAL) 5 MG tablet, Take 1 tablet (5 mg total) by mouth at bedtime as needed for allergies., Disp: 90 tablet, Rfl: 3   levothyroxine (SYNTHROID) 137 MCG tablet, TAKE 1 TABLET BY MOUTH DAILY  BEFORE BREAKFAST, Disp: 100 tablet, Rfl: 3   LORazepam (ATIVAN) 0.5 MG tablet, Take 1 tablet (0.5 mg total) by mouth at bedtime as needed for anxiety (place on HOLD. pt does not need yet.)., Disp: 90 tablet, Rfl: 1   meloxicam (MOBIC) 15 MG tablet, Take 15 mg by mouth daily as needed for pain., Disp: , Rfl:    omeprazole (PRILOSEC) 20 MG capsule, TAKE 1 CAPSULE BY MOUTH DAILY, Disp: 100 capsule, Rfl: 3   pravastatin (PRAVACHOL) 80 MG tablet, TAKE 1 TABLET BY MOUTH DAILY, Disp: 100 tablet, Rfl: 3   triamcinolone cream (KENALOG) 0.1 %, APPLY TOPICALLY TWICE DAILY FOR 7 TO 10 DAYS, Disp: 30 g, Rfl: 0  valsartan-hydrochlorothiazide (DIOVAN-HCT) 160-25 MG tablet, TAKE 1 TABLET BY MOUTH DAILY, Disp: 100 tablet, Rfl: 0  Assessment/ Plan: 72 y.o. female   Bronchitis - Plan: benzonatate (TESSALON) 100 MG capsule, promethazine-dextromethorphan (PROMETHAZINE-DM) 6.25-15 MG/5ML syrup, cefdinir (OMNICEF) 300 MG capsule  Mixed hyperlipidemia - Plan: CT CARDIAC SCORING (SELF PAY ONLY)  Essential hypertension with goal blood pressure less than 130/80 - Plan: CT CARDIAC SCORING (SELF PAY ONLY)  Family history of ischemic heart disease (IHD) - Plan: CT CARDIAC SCORING (SELF PAY ONLY)  Suspect this is viral nature.  I will send over cough medications but  given her a pocket prescription for Omnicef if she needs it.  We discussed the indications for use of the antibiotic.  At this time I do not think that she has bacterial infection but she understands signs and symptoms that would suggest a bacterial infection.  She has a family history of ischemic heart disease in her father.  She would like to have coronary artery calcium scoring given hyperlipidemia and first-degree relative with CAD.  Further management pending these result  Start time: 8:50a (LVM); 8:53am End time: 9:04a  Total time spent on patient care (including telephone call/ virtual visit): 11 minutes  [place], [place] [phone]

## 2022-09-04 ENCOUNTER — Telehealth: Payer: Self-pay | Admitting: Family Medicine

## 2022-09-11 NOTE — Telephone Encounter (Signed)
Attempted to call pt , no answer left vm - disregarding message

## 2022-09-23 ENCOUNTER — Ambulatory Visit (HOSPITAL_COMMUNITY)
Admission: RE | Admit: 2022-09-23 | Discharge: 2022-09-23 | Disposition: A | Discharge status: Home / Self Care | Payer: Medicare Other | Source: Ambulatory Visit | Attending: Family Medicine | Admitting: Family Medicine

## 2022-09-23 DIAGNOSIS — E782 Mixed hyperlipidemia: Secondary | ICD-10-CM

## 2022-09-23 DIAGNOSIS — I10 Essential (primary) hypertension: Secondary | ICD-10-CM | POA: Insufficient documentation

## 2022-09-23 DIAGNOSIS — Z8249 Family history of ischemic heart disease and other diseases of the circulatory system: Secondary | ICD-10-CM | POA: Insufficient documentation

## 2022-09-24 ENCOUNTER — Ambulatory Visit (INDEPENDENT_AMBULATORY_CARE_PROVIDER_SITE_OTHER): Payer: Medicare Other | Admitting: Family Medicine

## 2022-09-24 ENCOUNTER — Encounter: Payer: Self-pay | Admitting: Family Medicine

## 2022-09-24 VITALS — BP 138/76 | HR 87 | Temp 98.6°F | Ht 60.0 in | Wt 158.0 lb

## 2022-09-24 DIAGNOSIS — F5101 Primary insomnia: Secondary | ICD-10-CM

## 2022-09-24 DIAGNOSIS — F411 Generalized anxiety disorder: Secondary | ICD-10-CM

## 2022-09-24 DIAGNOSIS — F410 Panic disorder [episodic paroxysmal anxiety] without agoraphobia: Secondary | ICD-10-CM | POA: Diagnosis not present

## 2022-09-24 DIAGNOSIS — Z79899 Other long term (current) drug therapy: Secondary | ICD-10-CM

## 2022-09-24 DIAGNOSIS — I10 Essential (primary) hypertension: Secondary | ICD-10-CM | POA: Diagnosis not present

## 2022-09-24 MED ORDER — ESCITALOPRAM OXALATE 20 MG PO TABS: 20.0000 mg | ORAL_TABLET | Freq: Every day | ORAL | 3 refills | Status: DC

## 2022-09-24 MED ORDER — LORAZEPAM 0.5 MG PO TABS: 0.5000 mg | ORAL_TABLET | Freq: Every evening | ORAL | 1 refills | Status: DC | PRN

## 2022-09-24 MED ORDER — VALSARTAN-HYDROCHLOROTHIAZIDE 160-25 MG PO TABS: 1.0000 | ORAL_TABLET | Freq: Every day | ORAL | 3 refills | Status: DC

## 2022-09-24 MED ORDER — AMLODIPINE BESYLATE 5 MG PO TABS: 5.0000 mg | ORAL_TABLET | Freq: Every day | ORAL | 3 refills | Status: DC

## 2022-09-24 NOTE — Progress Notes (Signed)
Subjective: CC: Generalized anxiety disorder, stress reaction PCP: Janora Norlander, DO HPI:Catherine Mayer is a 72 y.o. female presenting to clinic today for:  1.  Generalized anxiety disorder with panic attack Patient is compliant with her Lexapro 20 mg daily.  She continues to have a need for Ativan intermittently in fact had an episode recently where she had a falling out with a relative.  She never consumes alcohol with this and in fact rarely consumes alcohol unless it is socially.  Denies any memory changes, falls, visual or auditory hallucinations.  2.  Hypertension She is compliant with Norvasc, Diovan-HCTZ.  Headaches have been stable.  No chest pain, shortness of breath, visual disturbance reported  ROS: Per HPI  No Known Allergies Past Medical History:  Diagnosis Date   Anxiety    Arthritis    hands, knees   Depression    GERD (gastroesophageal reflux disease)    Hyperlipidemia    Hypertension    Hypothyroidism    Thyroid disease    Trimalleolar fracture of ankle, closed, right, initial encounter     Current Outpatient Medications:    amLODipine (NORVASC) 5 MG tablet, TAKE 1 TABLET BY MOUTH DAILY, Disp: 90 tablet, Rfl: 1   azelastine (ASTELIN) 0.1 % nasal spray, Place 1 spray into both nostrils 2 (two) times daily. For runny nose, Disp: 30 mL, Rfl: 12   diclofenac Sodium (VOLTAREN) 1 % GEL, , Disp: , Rfl:    escitalopram (LEXAPRO) 20 MG tablet, TAKE 1 TABLET BY MOUTH DAILY, Disp: 100 tablet, Rfl: 0   levocetirizine (XYZAL) 5 MG tablet, Take 1 tablet (5 mg total) by mouth at bedtime as needed for allergies., Disp: 90 tablet, Rfl: 3   levothyroxine (SYNTHROID) 137 MCG tablet, TAKE 1 TABLET BY MOUTH DAILY  BEFORE BREAKFAST, Disp: 100 tablet, Rfl: 3   LORazepam (ATIVAN) 0.5 MG tablet, Take 1 tablet (0.5 mg total) by mouth at bedtime as needed for anxiety (place on HOLD. pt does not need yet.)., Disp: 90 tablet, Rfl: 1   meloxicam (MOBIC) 15 MG tablet, Take 15 mg by mouth  daily as needed for pain., Disp: , Rfl:    omeprazole (PRILOSEC) 20 MG capsule, TAKE 1 CAPSULE BY MOUTH DAILY, Disp: 100 capsule, Rfl: 3   pravastatin (PRAVACHOL) 80 MG tablet, TAKE 1 TABLET BY MOUTH DAILY, Disp: 100 tablet, Rfl: 3   promethazine-dextromethorphan (PROMETHAZINE-DM) 6.25-15 MG/5ML syrup, Take 2.5 mLs by mouth 4 (four) times daily as needed., Disp: 118 mL, Rfl: 0   triamcinolone cream (KENALOG) 0.1 %, APPLY TOPICALLY TWICE DAILY FOR 7 TO 10 DAYS, Disp: 30 g, Rfl: 0   valsartan-hydrochlorothiazide (DIOVAN-HCT) 160-25 MG tablet, TAKE 1 TABLET BY MOUTH DAILY, Disp: 100 tablet, Rfl: 0 Social History   Socioeconomic History   Marital status: Widowed    Spouse name: Elenore Rota   Number of children: 1   Years of education: Not on file   Highest education level: Associate degree: occupational, Hotel manager, or vocational program  Occupational History   Occupation: Economist    Comment: part time  Tobacco Use   Smoking status: Never   Smokeless tobacco: Never  Vaping Use   Vaping Use: Never used  Substance and Sexual Activity   Alcohol use: Not Currently   Drug use: Never   Sexual activity: Not on file  Other Topics Concern   Not on file  Social History Narrative   Husband passed away 10/14/2020   Enjoys playing with dogs and being outside.  Social Determinants of Health   Financial Resource Strain: Low Risk  (09/02/2022)   Overall Financial Resource Strain (CARDIA)    Difficulty of Paying Living Expenses: Not hard at all  Food Insecurity: No Food Insecurity (09/02/2022)   Hunger Vital Sign    Worried About Running Out of Food in the Last Year: Never true    Ran Out of Food in the Last Year: Never true  Transportation Needs: No Transportation Needs (09/02/2022)   PRAPARE - Hydrologist (Medical): No    Lack of Transportation (Non-Medical): No  Physical Activity: Insufficiently Active (09/02/2022)   Exercise Vital Sign    Days of Exercise per  Week: 4 days    Minutes of Exercise per Session: 30 min  Stress: No Stress Concern Present (09/02/2022)   [place]    Feeling of Stress : Not at all  Social Connections: Moderately Isolated (09/02/2022)   Social Connection and Isolation Panel [NHANES]    Frequency of Communication with Friends and Family: More than three times a week    Frequency of Social Gatherings with Friends and Family: More than three times a week    Attends Religious Services: More than 4 times per year    Active Member of [employer] or Organizations: No    Attends Archivist Meetings: Never    Marital Status: Widowed  Intimate Partner Violence: Not At Risk (09/02/2022)   Humiliation, Afraid, Rape, and Kick questionnaire    Fear of Current or Ex-Partner: No    Emotionally Abused: No    Physically Abused: No    Sexually Abused: No   Family History  Problem Relation Age of Onset   Hypertension Mother    Hyperlipidemia Mother    Hypertension Father    Hypertension Sister    Hypertension Son    Hyperlipidemia Son    Hypertension Sister    Hypertension Sister     Objective: Office vital signs reviewed. BP 138/76   Pulse 87   Temp 98.6 F (37 C)   Ht 5' (1.524 m)   Wt 158 lb (71.7 kg)   SpO2 98%   BMI 30.86 kg/m   Physical Examination:  General: Awake, alert, well nourished, No acute distress Psych: Mood stable, speech normal.  She certainly is anxious and becomes a little upset when talking about the interaction with her family member.     09/02/2022    9:17 AM 04/22/2022    1:17 PM 02/25/2022    2:27 PM  Depression screen PHQ 2/9  Decreased Interest 0 1 1  Down, Depressed, Hopeless 0 0 1  PHQ - 2 Score 0 1 2  Altered sleeping  0 1  Tired, decreased energy  1 1  Change in appetite  0 0  Feeling bad or failure about yourself   0 1  Trouble concentrating  0 1  Moving slowly or fidgety/restless  0 0  Suicidal thoughts  0  0  PHQ-9 Score  2 6  Difficult doing work/chores  Somewhat difficult Somewhat difficult      04/22/2022    1:17 PM 02/25/2022    2:27 PM 08/27/2021    8:19 AM 02/26/2021    9:27 AM  GAD 7 : Generalized Anxiety Score  Nervous, Anxious, on Edge 1 1 1 2   Control/stop worrying 1 2 1 2   Worry too much - different things 1 2 1 2   Trouble relaxing 1 2  0 2  Restless 0 1 1 2   Easily annoyed or irritable 0 0 0 1  Afraid - awful might happen 1 1 1 1   Total GAD 7 Score 5 9 5 12   Anxiety Difficulty Somewhat difficult Not difficult at all Somewhat difficult Somewhat difficult    Assessment/ Plan: 72 y.o. female   Generalized anxiety disorder with panic attacks - Plan: ToxASSURE Select 13 (MW), Urine, LORazepam (ATIVAN) 0.5 MG tablet  Primary insomnia - Plan: ToxASSURE Select 13 (MW), Urine, LORazepam (ATIVAN) 0.5 MG tablet  Controlled substance agreement signed - Plan: ToxASSURE Select 13 (MW), Urine  Essential hypertension with goal blood pressure less than 130/80 - Plan: amLODipine (NORVASC) 5 MG tablet  Anxiety is slightly exacerbated given recent events.  She will continue current regimen.  Meds have been renewed.  UDS and CSC were updated as per office policy.  National narcotic database reviewed and there were no red flags  Blood pressure at goal.  No changes.  Continue current regimen  No orders of the defined types were placed in this encounter.  No orders of the defined types were placed in this encounter.  Total time spent with patient 26 minutes.  Greater than 50% of encounter spent in coordination of care/counseling.   Janora Norlander, DO [place] [phone]

## 2022-09-26 LAB — TOXASSURE SELECT 13 (MW), URINE

## 2022-10-13 ENCOUNTER — Encounter: Payer: Self-pay | Admitting: Physician Assistant

## 2022-10-13 ENCOUNTER — Ambulatory Visit: Payer: Medicare Other | Admitting: Physician Assistant

## 2022-10-13 VITALS — BP 160/100 | HR 88 | Ht 62.0 in | Wt 157.0 lb

## 2022-10-13 DIAGNOSIS — K219 Gastro-esophageal reflux disease without esophagitis: Secondary | ICD-10-CM

## 2022-10-13 DIAGNOSIS — Z1211 Encounter for screening for malignant neoplasm of colon: Secondary | ICD-10-CM

## 2022-10-13 MED ORDER — NA SULFATE-K SULFATE-MG SULF 17.5-3.13-1.6 GM/177ML PO SOLN: 1.0000 | Freq: Once | ORAL | 0 refills | Status: AC

## 2022-10-13 NOTE — Progress Notes (Signed)
Chief Complaint: Discuss colonoscopy  HPI:    Catherine Mayer is a 72 year old female with a past medical history as listed below including anxiety, depression and GERD, who was referred to me by [name], DO for a discussion of a colonoscopy.    Today, patient presents to clinic and tells me that they are telling her she is due for a colonoscopy.  She tells me it has been at least 20 years since her last one which was actually done here but she cannot remember with who.  Tells me she is not sure that she had any polyps at that point.  Does describe occasional "hemorrhoids that bleed".  Tells me that occasionally they will itch and she will see little bright red blood in the toilet.  This been happening off and on for years.  Also occasional constipation but it does not bother her much and is easily fixed with her diet.  Also describes very occasional reflux symptoms depending on if she eats something spicy for which she will take Omeprazole 20 mg as needed.  Really no acute GI concerns.    Denies fever, chills, weight loss, abdominal pain, nausea or vomiting.  Past Medical History:  Diagnosis Date   Anxiety    Arthritis    hands, knees   Depression    GERD (gastroesophageal reflux disease)    Hyperlipidemia    Hypertension    Hypothyroidism    Thyroid disease    Trimalleolar fracture of ankle, closed, right, initial encounter     Past Surgical History:  Procedure Laterality Date   ABDOMINAL HYSTERECTOMY     CARPAL TUNNEL RELEASE Bilateral 2013   CHOLECYSTECTOMY     ORIF ANKLE FRACTURE Right 06/14/2020   Procedure: Open treatment right ankle trimalleolar fracture with internal fixation;  Surgeon: Toni Arthurs, MD;  Location: [facility];  Service: Orthopedics;  Laterality: Right;     Current Outpatient Medications  Medication Sig Dispense Refill   amLODipine (NORVASC) 5 MG tablet Take 1 tablet (5 mg total) by mouth daily. 100 tablet 3   azelastine  (ASTELIN) 0.1 % nasal spray Place 1 spray into both nostrils 2 (two) times daily. For runny nose 30 mL 12   diclofenac Sodium (VOLTAREN) 1 % GEL      escitalopram (LEXAPRO) 20 MG tablet Take 1 tablet (20 mg total) by mouth daily. 100 tablet 3   levocetirizine (XYZAL) 5 MG tablet Take 1 tablet (5 mg total) by mouth at bedtime as needed for allergies. 90 tablet 3   levothyroxine (SYNTHROID) 137 MCG tablet TAKE 1 TABLET BY MOUTH DAILY  BEFORE BREAKFAST 100 tablet 3   LORazepam (ATIVAN) 0.5 MG tablet Take 1 tablet (0.5 mg total) by mouth at bedtime as needed for anxiety (place on HOLD. pt does not need yet.). 90 tablet 1   omeprazole (PRILOSEC) 20 MG capsule TAKE 1 CAPSULE BY MOUTH DAILY 100 capsule 3   pravastatin (PRAVACHOL) 80 MG tablet TAKE 1 TABLET BY MOUTH DAILY 100 tablet 3   triamcinolone cream (KENALOG) 0.1 % APPLY TOPICALLY TWICE DAILY FOR 7 TO 10 DAYS 30 g 0   valsartan-hydrochlorothiazide (DIOVAN-HCT) 160-25 MG tablet Take 1 tablet by mouth daily. 100 tablet 3   No current facility-administered medications for this visit.    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    Social Determinants of Health   Financial Resource Strain: Low Risk  (09/02/2022)   Overall Financial Resource Strain (CARDIA)    Difficulty of Paying Living Expenses: Not hard at all  Food Insecurity: No Food Insecurity (09/02/2022)   Hunger Vital Sign    Worried About Running Out of Food in the Last Year: Never true    Ran Out of Food in the Last Year: Never true  Transportation Needs: No Transportation Needs (09/02/2022)   PRAPARE - Administrator, Civil Service (Medical): No    Lack of Transportation (Non-Medical): No  Physical Activity: Insufficiently Active (09/02/2022)   Exercise Vital Sign    Days of Exercise per Week: 4 days    Minutes of Exercise per Session: 30 min  Stress: No Stress Concern Present (09/02/2022)   [employer] of Occupational Health - Occupational Stress Questionnaire    Feeling of Stress : Not at all  Social Connections: Moderately Isolated (09/02/2022)   Social Connection and Isolation Panel [NHANES]    Frequency of Communication with Friends and Family: More than three times a week    Frequency of Social Gatherings with Friends and Family: More than three times a week    Attends Religious Services: More than 4 times per year    Active Member of [employer] or Organizations: No    Attends Banker Meetings: Never    Marital Status: Widowed  Intimate Partner Violence: Not At Risk (09/02/2022)   Humiliation, Afraid, Rape, and Kick questionnaire    Fear of Current or Ex-Partner: No    Emotionally Abused: No    Physically Abused: No    Sexually Abused: No    Review of Systems:    Constitutional: No weight loss, fever or chills Skin: No rash Cardiovascular: No chest pain Respiratory: No SOB  Gastrointestinal: See HPI and otherwise negative Genitourinary: No dysuria Neurological: No headache, dizziness or syncope Musculoskeletal:  No new muscle or joint pain Hematologic: No bruising Psychiatric: No history of depression or anxiety    Physical Exam:  Vital signs: BP (!) 160/100   Pulse 88   Ht 5\' 2"  (1.575 m)   Wt 157 lb (71.2 kg)   BMI 28.72 kg/m   Constitutional:   Pleasant Elderly Caucasian female appears to be in NAD, Well developed, Well nourished, alert and cooperative Head:  Normocephalic and atraumatic. Eyes:   PEERL, EOMI. No icterus. Conjunctiva pink. Ears:  Normal auditory acuity. Neck:  Supple Throat: Oral cavity and pharynx without inflammation, swelling or lesion.  Respiratory: Respirations  even and unlabored. Lungs clear to auscultation bilaterally.   No wheezes, crackles, or rhonchi.  Cardiovascular: Normal S1, S2. No MRG. Regular rate and rhythm. No peripheral edema, cyanosis or pallor.  Gastrointestinal:  Soft, nondistended, nontender. No rebound or guarding. Normal bowel sounds. No appreciable masses or hepatomegaly. Rectal:  Not performed.  Msk:  Symmetrical without gross deformities. Without edema, no deformity or joint abnormality.  Neurologic:  Alert and  oriented x4;  grossly normal neurologically.  Skin:   Dry and intact without significant lesions or rashes. Psychiatric: Demonstrates good judgement and reason without abnormal affect or behaviors.  MOST RECENT LABS AND IMAGING: CBC    Component Value Date/Time   WBC 5.3 01/17/2020 0828   WBC 5.6 07/02/2018 1639   RBC 4.16 01/17/2020 0828   RBC 4.13 07/02/2018 1639   HGB 13.8 01/17/2020 0828   HCT 39.8 01/17/2020 0828   PLT 255 01/17/2020 0828   MCV 96 01/17/2020 0828   MCH 33.2 (H) 01/17/2020 0828   MCH 32.2 07/02/2018 1639   MCHC 34.7 01/17/2020 0828   MCHC 34.6 07/02/2018 1639   RDW 14.0 01/17/2020 0828    CMP     Component Value Date/Time   NA 142 08/27/2021 0915   K 3.5 08/27/2021 0915   CL 100 08/27/2021 0915   CO2 26 08/27/2021 0915   GLUCOSE 96 08/27/2021 0915   GLUCOSE 102 (H) 06/11/2020 1400   BUN 14  08/27/2021 0915   CREATININE 0.83 08/27/2021 0915   CALCIUM 9.2 08/27/2021 0915   PROT 6.7 08/27/2021 0915   ALBUMIN 4.4 08/27/2021 0915   AST 27 08/27/2021 0915   ALT 35 (H) 08/27/2021 0915   ALKPHOS 105 08/27/2021 0915   BILITOT 0.4 08/27/2021 0915   GFRNONAA 62 08/14/2020 1356   GFRNONAA >60 06/11/2020 1400   GFRAA 72 08/14/2020 1356    Assessment: 1.  Screening for colorectal cancer: Has been at least 20+ years since patient's last colonoscopy and she is due another for screening  Plan: 1.  Scheduled patient for screening colonoscopy in the [facility] with Dr. Lavon PaganiniNandigam.  Did provide the patient a detailed list risks for the procedure and she agrees to proceed. Patient is appropriate for endoscopic procedure(s) in the ambulatory ([facility]) setting.  2.  Patient to follow in clinic with us per recommendations after time of procedure.  Hyacinth MeekerJennifer Mycah Formica, PA-C [facility] 10/13/2022, 2:08 PM  Cc: [name], [name], DO

## 2022-10-13 NOTE — Patient Instructions (Signed)
You have been scheduled for a colonoscopy. Please follow written instructions given to you at your visit today.  Please pick up your prep supplies at the pharmacy within the next 1-3 days. If you use inhalers (even only as needed), please bring them with you on the day of your procedure.  _______________________________________________________  If your blood pressure at your visit was 140/90 or greater, please contact your primary care physician to follow up on this.  _______________________________________________________  If you are age 19 or older, your body mass index should be between 23-30. Your Body mass index is 28.72 kg/m. If this is out of the aforementioned range listed, please consider follow up with your Primary Care Provider.  If you are age 53 or younger, your body mass index should be between 19-25. Your Body mass index is 28.72 kg/m. If this is out of the aformentioned range listed, please consider follow up with your Primary Care Provider.   ________________________________________________________  The [facility] providers would like to encourage you to use [facility] to communicate with providers for non-urgent requests or questions.  Due to long hold times on the telephone, sending your provider a message by [facility] may be a faster and more efficient way to get a response.  Please allow 48 business hours for a response.  Please remember that this is for non-urgent requests.  _______________________________________________________

## 2022-10-15 DIAGNOSIS — M79651 Pain in right thigh: Secondary | ICD-10-CM | POA: Diagnosis not present

## 2022-10-20 ENCOUNTER — Encounter: Payer: Medicare Other | Admitting: Gastroenterology

## 2022-10-21 NOTE — Progress Notes (Signed)
Reviewed and agree with documentation and assessment and plan. K. Veena Slater Mcmanaman , MD   

## 2022-10-22 ENCOUNTER — Encounter: Payer: Self-pay | Admitting: Family Medicine

## 2022-10-22 ENCOUNTER — Telehealth: Payer: Self-pay | Admitting: Family Medicine

## 2022-10-22 ENCOUNTER — Ambulatory Visit (INDEPENDENT_AMBULATORY_CARE_PROVIDER_SITE_OTHER): Payer: Medicare Other | Admitting: Family Medicine

## 2022-10-22 VITALS — BP 163/111 | HR 98 | Temp 98.3°F | Ht 62.0 in | Wt 158.0 lb

## 2022-10-22 DIAGNOSIS — D485 Neoplasm of uncertain behavior of skin: Secondary | ICD-10-CM

## 2022-10-22 DIAGNOSIS — L989 Disorder of the skin and subcutaneous tissue, unspecified: Secondary | ICD-10-CM

## 2022-10-22 DIAGNOSIS — I10 Essential (primary) hypertension: Secondary | ICD-10-CM | POA: Diagnosis not present

## 2022-10-22 NOTE — Progress Notes (Signed)
Subjective: CC: Skin lesion PCP: [name], DO HPI:Catherine Mayer is a 72 y.o. female presenting to clinic today for:  1.  Skin lesion Patient reports a skin lesion on the right temporal area.  She reports that sometimes it is aggravated when she tries to comb her hair.  No spontaneous bleeding.  No pigment observed.  No pain or itching  2.  Hypertension Patient is compliant with medications.  No chest pain, shortness of breath, blurred vision or edema.  She does report ongoing stress related to her family issues.   ROS: Per HPI  No Known Allergies Past Medical History:  Diagnosis Date   Anxiety    Arthritis    hands, knees   Depression    GERD (gastroesophageal reflux disease)    Hyperlipidemia    Hypertension    Hypothyroidism    Thyroid disease    Trimalleolar fracture of ankle, closed, right, initial encounter     Current Outpatient Medications:    amLODipine (NORVASC) 5 MG tablet, Take 1 tablet (5 mg total) by mouth daily., Disp: 100 tablet, Rfl: 3   azelastine (ASTELIN) 0.1 % nasal spray, Place 1 spray into both nostrils 2 (two) times daily. For runny nose, Disp: 30 mL, Rfl: 12   diclofenac Sodium (VOLTAREN) 1 % GEL, , Disp: , Rfl:    escitalopram (LEXAPRO) 20 MG tablet, Take 1 tablet (20 mg total) by mouth daily., Disp: 100 tablet, Rfl: 3   levocetirizine (XYZAL) 5 MG tablet, Take 1 tablet (5 mg total) by mouth at bedtime as needed for allergies., Disp: 90 tablet, Rfl: 3   levothyroxine (SYNTHROID) 137 MCG tablet, TAKE 1 TABLET BY MOUTH DAILY  BEFORE BREAKFAST, Disp: 100 tablet, Rfl: 3   LORazepam (ATIVAN) 0.5 MG tablet, Take 1 tablet (0.5 mg total) by mouth at bedtime as needed for anxiety (place on HOLD. pt does not need yet.)., Disp: 90 tablet, Rfl: 1   omeprazole (PRILOSEC) 20 MG capsule, TAKE 1 CAPSULE BY MOUTH DAILY, Disp: 100 capsule, Rfl: 3   pravastatin (PRAVACHOL) 80 MG tablet, TAKE 1 TABLET BY MOUTH DAILY, Disp: 100 tablet, Rfl: 3   triamcinolone  cream (KENALOG) 0.1 %, APPLY TOPICALLY TWICE DAILY FOR 7 TO 10 DAYS, Disp: 30 g, Rfl: 0   valsartan-hydrochlorothiazide (DIOVAN-HCT) 160-25 MG tablet, Take 1 tablet by mouth daily., Disp: 100 tablet, Rfl: 3 Social History   Socioeconomic History   Marital status: Widowed    Spouse name: [name]   Number of children: 1   Years of education: Not on file   Highest education level: Associate degree: occupational, Scientist, product/process development, or vocational program  Occupational History   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    Social Determinants of Health   Financial Resource Strain: Low Risk  (10/19/2022)   Overall Financial Resource Strain (CARDIA)    Difficulty of Paying Living Expenses: Not hard at all  Food Insecurity: No Food Insecurity (10/19/2022)   Hunger Vital Sign    Worried About Running Out of Food in the Last Year: Never true  Ran Out of Food in the Last Year: Never true  Transportation Needs: No Transportation Needs (10/19/2022)   PRAPARE - Administrator, Civil Service (Medical): No    Lack of Transportation (Non-Medical): No  Physical Activity: Inactive (10/19/2022)   Exercise Vital Sign    Days of Exercise per Week: 3 days    Minutes of Exercise per Session: 0 min  Stress: Stress Concern Present (10/19/2022)   [employer] of Occupational Health - Occupational Stress Questionnaire    Feeling of Stress : To some extent  Social Connections: Moderately Integrated (10/19/2022)   Social Connection and Isolation Panel [NHANES]    Frequency of Communication with Friends and Family: Twice a week    Frequency of  Social Gatherings with Friends and Family: Once a week    Attends Religious Services: More than 4 times per year    Active Member of [employer] or Organizations: Yes    Attends Banker Meetings: More than 4 times per year    Marital Status: Widowed  Recent Concern: Social Connections - Moderately Isolated (09/02/2022)   Social Connection and Isolation Panel [NHANES]    Frequency of Communication with Friends and Family: More than three times a week    Frequency of Social Gatherings with Friends and Family: More than three times a week    Attends Religious Services: More than 4 times per year    Active Member of [employer] or Organizations: No    Attends Banker Meetings: Never    Marital Status: Widowed  Intimate Partner Violence: Not At Risk (09/02/2022)   Humiliation, Afraid, Rape, and Kick questionnaire    Fear of Current or Ex-Partner: No    Emotionally Abused: No    Physically Abused: No    Sexually Abused: No   Family History  Problem Relation Age of Onset   Hypertension Mother    Hyperlipidemia Mother    Hypertension Father    Hypertension Sister    Hypertension Sister    Hypertension Sister    Hypertension Son    Hyperlipidemia Son    Colon cancer Neg Hx    Liver disease Neg Hx    Esophageal cancer Neg Hx     Objective: Office vital signs reviewed. BP (!) 163/111   Pulse 98   Temp 98.3 F (36.8 C)   Ht 5\' 2"  (1.575 m)   Wt 158 lb (71.7 kg)   SpO2 94%   BMI 28.90 kg/m   Physical Examination:  General: Awake, alert, well nourished, No acute distress HEENT: sclera white, MMM Cardio: regular rate and rhythm, S1S2 heard, no murmurs appreciated Pulm: clear to auscultation bilaterally, no wheezes, rhonchi or rales; normal work of breathing on room air Skin: Flesh-colored, hyperkeratotic lesion that is about 1 to 2 mm in size noted along the right temporal area.  No central umbilication, hypervascularity or pigment to the lesion  Cryotherapy  Procedure:  Risks and benefits of procedure were reviewed with the patient.  Verbal consent obtained and scanned into the chart.  Lesion of concern was identified and located on right temple.  Liquid nitrogen was applied to area of concern and extending out 1 millimeters beyond the border of the lesion.  Treated area was allowed to come back to room temperature before treating it a second time.  Patient tolerated procedure well and there were no immediate complications.  Home care instructions were reviewed with the patient and a handout was provided.   Assessment/ Plan: 72  y.o. female   Skin lesion  Essential hypertension with goal blood pressure less than 130/80  Treated with cryoablation. No immediate complications.    BP NOT controlled. ? Stress response.  She will monitor BP at home and return log to me in 1 week. If persistently >150/90, will advance norvasc   No orders of the defined types were placed in this encounter.  No orders of the defined types were placed in this encounter.    [name], DO [facility] [phone]

## 2022-10-22 NOTE — Telephone Encounter (Signed)
Patient has appt on 5/13 for mammogram and she would like to have a have a DEXA scan on the same day, after her mammogram. Please call her house phone and leave a voicemail with the time or letting her know if we are unable to do this.

## 2022-10-23 NOTE — Telephone Encounter (Signed)
Appt made - lm for pt

## 2022-10-24 ENCOUNTER — Encounter: Payer: Self-pay | Admitting: Gastroenterology

## 2022-11-03 ENCOUNTER — Encounter: Payer: Self-pay | Admitting: Gastroenterology

## 2022-11-03 ENCOUNTER — Ambulatory Visit (AMBULATORY_SURGERY_CENTER): Payer: Medicare Other | Admitting: Gastroenterology

## 2022-11-03 VITALS — BP 139/94 | HR 81 | Temp 98.0°F | Resp 13 | Ht 62.0 in | Wt 157.0 lb

## 2022-11-03 DIAGNOSIS — D123 Benign neoplasm of transverse colon: Secondary | ICD-10-CM | POA: Diagnosis not present

## 2022-11-03 DIAGNOSIS — Z1211 Encounter for screening for malignant neoplasm of colon: Secondary | ICD-10-CM | POA: Diagnosis not present

## 2022-11-03 MED ORDER — SODIUM CHLORIDE 0.9 % IV SOLN: 500.0000 mL | Freq: Once | INTRAVENOUS | Status: DC

## 2022-11-03 NOTE — Progress Notes (Signed)
Pt's states no medical or surgical changes since previsit or office visit. VS assessed by D.T 

## 2022-11-03 NOTE — Progress Notes (Signed)
Please refer to office visit note 10/13/22. No additional changes in H&P Patient is appropriate for planned procedure(s) and anesthesia in an ambulatory setting  K. Veena Martavia Tye , MD [phone]   

## 2022-11-03 NOTE — Progress Notes (Signed)
Initial MAP target 98

## 2022-11-03 NOTE — Progress Notes (Signed)
Report to PACU, RN, vss, BBS= Clear.  

## 2022-11-03 NOTE — Op Note (Signed)
[facility] Patient Name: Catherine Mayer Procedure Date: 11/03/2022 1:27 PM MRN: [id number] Endoscopist: Napoleon Form , MD, [id number] Age: 72 Referring MD:  Date of Birth: 1950-07-11 Gender: Female Account #: [id number] Procedure:                Colonoscopy Indications:              Screening for colorectal malignant neoplasm Medicines:                Monitored Anesthesia Care Procedure:                Pre-Anesthesia Assessment:                           - Prior to the procedure, a History and Physical                            was performed, and patient medications and                            allergies were reviewed. The patient's tolerance of                            previous anesthesia was also reviewed. The risks                            and benefits of the procedure and the sedation                            options and risks were discussed with the patient.                            All questions were answered, and informed consent                            was obtained. Prior Anticoagulants: The patient has                            taken no anticoagulant or antiplatelet agents. ASA                            Grade Assessment: II - A patient with mild systemic                            disease. After reviewing the risks and benefits,                            the patient was deemed in satisfactory condition to                            undergo the procedure.                           After obtaining informed consent, the colonoscope  was passed under direct vision. Throughout the                            procedure, the patient's blood pressure, pulse, and                            oxygen saturations were monitored continuously. The                            Olympus PCF-H190DL ([id number]) Colonoscope was                            introduced through the anus and advanced to the the                            cecum,  identified by appendiceal orifice and                            ileocecal valve. The colonoscopy was performed                            without difficulty. The patient tolerated the                            procedure well. The quality of the bowel                            preparation was good. The ileocecal valve,                            appendiceal orifice, and rectum were photographed. Scope In: 1:45:22 PM Scope Out: 2:03:08 PM Scope Withdrawal Time: 0 hours 9 minutes 57 seconds  Total Procedure Duration: 0 hours 17 minutes 46 seconds  Findings:                 The perianal and digital rectal examinations were                            normal.                           A 5 mm polyp was found in the transverse colon. The                            polyp was sessile. The polyp was removed with a                            cold snare. Resection and retrieval were complete.                           Scattered small-mouthed diverticula were found in                            the sigmoid colon, descending colon, transverse  colon and ascending colon.                           Non-bleeding external and internal hemorrhoids were                            found during retroflexion. The hemorrhoids were                            small. Complications:            No immediate complications. Estimated Blood Loss:     Estimated blood loss was minimal. Impression:               - One 5 mm polyp in the transverse colon, removed                            with a cold snare. Resected and retrieved.                           - Diverticulosis in the sigmoid colon, in the                            descending colon, in the transverse colon and in                            the ascending colon.                           - Non-bleeding external and internal hemorrhoids. Recommendation:           - Patient has a contact number available for                             emergencies. The signs and symptoms of potential                            delayed complications were discussed with the                            patient. Return to normal activities tomorrow.                            Written discharge instructions were provided to the                            patient.                           - Resume previous diet.                           - Continue present medications.                           - Await pathology results.                           -  Repeat colonoscopy in 5-10 years for surveillance. Napoleon Form, MD 11/03/2022 2:16:04 PM This report has been signed electronically.

## 2022-11-03 NOTE — Progress Notes (Signed)
Called to room to assist during endoscopic procedure.  Patient ID and intended procedure confirmed with present staff. Received instructions for my participation in the procedure from the performing physician.  

## 2022-11-03 NOTE — Patient Instructions (Signed)
Thank you for coming in to see [place] today! Resume your diet today. Resume your medications today. Return to regular daily activities tomorrow. Recommend repeat colonoscopy in 5-10 years,  which will be based on results of today's biopsies. ( 1-2 weeks)    YOU HAD AN ENDOSCOPIC PROCEDURE TODAY AT THE [facility]:   Refer to the procedure report that was given to you for any specific questions about what was found during the examination.  If the procedure report does not answer your questions, please call your gastroenterologist to clarify.  If you requested that your care partner not be given the details of your procedure findings, then the procedure report has been included in a sealed envelope for you to review at your convenience later.  YOU SHOULD EXPECT: Some feelings of bloating in the abdomen. Passage of more gas than usual.  Walking can help get rid of the air that was put into your GI tract during the procedure and reduce the bloating. If you had a lower endoscopy (such as a colonoscopy or flexible sigmoidoscopy) you may notice spotting of blood in your stool or on the toilet paper. If you underwent a bowel prep for your procedure, you may not have a normal bowel movement for a few days.  Please Note:  You might notice some irritation and congestion in your nose or some drainage.  This is from the oxygen used during your procedure.  There is no need for concern and it should clear up in a day or so.  SYMPTOMS TO REPORT IMMEDIATELY:  Following lower endoscopy (colonoscopy or flexible sigmoidoscopy):  Excessive amounts of blood in the stool  Significant tenderness or worsening of abdominal pains  Swelling of the abdomen that is new, acute  Fever of 100F or higher    For urgent or emergent issues, a gastroenterologist can be reached at any hour by calling [phone]. Do not use MyChart messaging for urgent concerns.    DIET:  We do recommend a small meal at first, but  then you may proceed to your regular diet.  Drink plenty of fluids but you should avoid alcoholic beverages for 24 hours.  ACTIVITY:  You should plan to take it easy for the rest of today and you should NOT DRIVE or use heavy machinery until tomorrow (because of the sedation medicines used during the test).    FOLLOW UP: Our staff will call the number listed on your records the next business day following your procedure.  We will call around 7:15- 8:00 am to check on you and address any questions or concerns that you may have regarding the information given to you following your procedure. If we do not reach you, we will leave a message.     If any biopsies were taken you will be contacted by phone or by letter within the next 1-3 weeks.  Please call us at [phone] if you have not heard about the biopsies in 3 weeks.    SIGNATURES/CONFIDENTIALITY: You and/or your care partner have signed paperwork which will be entered into your electronic medical record.  These signatures attest to the fact that that the information above on your After Visit Summary has been reviewed and is understood.  Full responsibility of the confidentiality of this discharge information lies with you and/or your care-partner.

## 2022-11-04 ENCOUNTER — Telehealth: Payer: Self-pay | Admitting: *Deleted

## 2022-11-04 NOTE — Telephone Encounter (Signed)
Post procedure follow up call placed, no answer and left VM.  

## 2022-11-14 ENCOUNTER — Encounter: Payer: Self-pay | Admitting: Gastroenterology

## 2022-11-17 ENCOUNTER — Ambulatory Visit
Admission: RE | Admit: 2022-11-17 | Discharge: 2022-11-17 | Disposition: A | Discharge status: Home / Self Care | Payer: Medicare Other | Source: Ambulatory Visit | Attending: Family Medicine | Admitting: Family Medicine

## 2022-11-17 ENCOUNTER — Other Ambulatory Visit: Payer: Self-pay | Admitting: Family Medicine

## 2022-11-17 ENCOUNTER — Ambulatory Visit: Payer: Medicare Other

## 2022-11-17 DIAGNOSIS — Z1231 Encounter for screening mammogram for malignant neoplasm of breast: Secondary | ICD-10-CM

## 2022-11-17 DIAGNOSIS — Z780 Asymptomatic menopausal state: Secondary | ICD-10-CM

## 2022-11-19 ENCOUNTER — Other Ambulatory Visit: Payer: Medicare Other

## 2022-12-24 ENCOUNTER — Other Ambulatory Visit: Payer: Self-pay | Admitting: *Deleted

## 2022-12-24 DIAGNOSIS — R21 Rash and other nonspecific skin eruption: Secondary | ICD-10-CM

## 2022-12-24 MED ORDER — TRIAMCINOLONE ACETONIDE 0.1 % EX CREA
TOPICAL_CREAM | CUTANEOUS | 0 refills | Status: DC
Start: 2022-12-24 — End: 2023-06-23

## 2022-12-30 ENCOUNTER — Telehealth: Payer: Self-pay

## 2022-12-30 NOTE — Telephone Encounter (Signed)
Spoke to patient today, she states that she is having right upper leg pain x months that is worse after she is sitting for extended times at work. She states that she has discussed with you in the past.   Pain  She reports recurrent right thigh and hip  pain. was not an injury that may have caused the pain. The pain started a few months ago and is worsening. The pain does not radiate The pain is described as cramping, intense at times, pain is varies in intensity, occurring intermittently. Symptoms are worse after patient has been sitting for awhile.  Aggravating factors: sitting Relieving factors: moving around helps at times .  She has tried acetaminophen, NSAIDs, and topical anesthetics with little relief.   ---------------------------------------------------------------------------------------------------

## 2022-12-30 NOTE — Telephone Encounter (Signed)
The last time we discussed this was 04/2022, see note, and at that time she reported it improved after changing her statin to every other day.  She has h/o DJD/ DDD noted on previous imaging.  Is she wanting to go to PT or see an orthopedist?

## 2023-01-01 NOTE — Telephone Encounter (Signed)
I have attempted to contact this patient by phone with the following results: left message to return my call on answering machine.

## 2023-01-02 NOTE — Telephone Encounter (Signed)
Patient said she does not know which one she would rather do. Patient made appt for 8/13 with PCP

## 2023-02-17 ENCOUNTER — Ambulatory Visit (INDEPENDENT_AMBULATORY_CARE_PROVIDER_SITE_OTHER): Payer: Medicare Other | Admitting: Family Medicine

## 2023-02-17 ENCOUNTER — Encounter: Payer: Self-pay | Admitting: Family Medicine

## 2023-02-17 VITALS — BP 122/82 | HR 75 | Temp 98.2°F | Ht 62.0 in | Wt 156.8 lb

## 2023-02-17 DIAGNOSIS — F5101 Primary insomnia: Secondary | ICD-10-CM | POA: Diagnosis not present

## 2023-02-17 DIAGNOSIS — F411 Generalized anxiety disorder: Secondary | ICD-10-CM

## 2023-02-17 DIAGNOSIS — M79651 Pain in right thigh: Secondary | ICD-10-CM | POA: Diagnosis not present

## 2023-02-17 DIAGNOSIS — E782 Mixed hyperlipidemia: Secondary | ICD-10-CM | POA: Diagnosis not present

## 2023-02-17 DIAGNOSIS — F410 Panic disorder [episodic paroxysmal anxiety] without agoraphobia: Secondary | ICD-10-CM

## 2023-02-17 DIAGNOSIS — E034 Atrophy of thyroid (acquired): Secondary | ICD-10-CM

## 2023-02-17 DIAGNOSIS — I10 Essential (primary) hypertension: Secondary | ICD-10-CM

## 2023-02-17 LAB — TSH

## 2023-02-17 LAB — CMP14+EGFR
ALT: 19 IU/L (ref 0–32)
AST: 23 IU/L (ref 0–40)
Albumin: 4.1 g/dL (ref 3.8–4.8)
Alkaline Phosphatase: 99 IU/L (ref 44–121)
BUN/Creatinine Ratio: 24 (ref 12–28)
BUN: 22 mg/dL (ref 8–27)
Bilirubin Total: 0.5 mg/dL (ref 0.0–1.2)
CO2: 27 mmol/L (ref 20–29)
Calcium: 9.2 mg/dL (ref 8.7–10.3)
Chloride: 100 mmol/L (ref 96–106)
Creatinine, Ser: 0.92 mg/dL (ref 0.57–1.00)
Globulin, Total: 2.2 g/dL (ref 1.5–4.5)
Glucose: 96 mg/dL (ref 70–99)
Potassium: 3.8 mmol/L (ref 3.5–5.2)
Sodium: 141 mmol/L (ref 134–144)
Total Protein: 6.3 g/dL (ref 6.0–8.5)
eGFR: 66 mL/min/{1.73_m2} (ref >59)

## 2023-02-17 LAB — LIPID PANEL
Chol/HDL Ratio: 6.4 ratio — ABNORMAL HIGH (ref 0.0–4.4)
Cholesterol, Total: 301 mg/dL — ABNORMAL HIGH (ref 100–199)
HDL: 47 mg/dL (ref >39)
LDL Chol Calc (NIH): 220 mg/dL — ABNORMAL HIGH (ref 0–99)
Triglycerides: 176 mg/dL — ABNORMAL HIGH (ref 0–149)
VLDL Cholesterol Cal: 34 mg/dL (ref 5–40)

## 2023-02-17 LAB — T4, FREE

## 2023-02-17 MED ORDER — LORAZEPAM 0.5 MG PO TABS
0.5000 mg | ORAL_TABLET | Freq: Every evening | ORAL | 1 refills | Status: DC | PRN
Start: 2023-05-08 — End: 2023-08-26

## 2023-02-17 MED ORDER — AMLODIPINE BESYLATE 5 MG PO TABS: 5.0000 mg | ORAL_TABLET | Freq: Every day | ORAL | 3 refills | Status: DC

## 2023-02-17 MED ORDER — LEVOTHYROXINE SODIUM 137 MCG PO TABS: ORAL_TABLET | ORAL | 3 refills | Status: DC

## 2023-02-17 MED ORDER — ESCITALOPRAM OXALATE 20 MG PO TABS: 20.0000 mg | ORAL_TABLET | Freq: Every day | ORAL | 3 refills | Status: DC

## 2023-02-17 MED ORDER — TIZANIDINE HCL 4 MG PO TABS
4.0000 mg | ORAL_TABLET | Freq: Four times a day (QID) | ORAL | 0 refills | Status: DC | PRN
Start: 2023-02-17 — End: 2023-06-23

## 2023-02-17 MED ORDER — VALSARTAN-HYDROCHLOROTHIAZIDE 160-25 MG PO TABS: 1.0000 | ORAL_TABLET | Freq: Every day | ORAL | 3 refills | Status: DC

## 2023-02-17 MED ORDER — OMEPRAZOLE 20 MG PO CPDR: 20.0000 mg | DELAYED_RELEASE_CAPSULE | Freq: Every day | ORAL | 3 refills | Status: DC

## 2023-02-17 NOTE — Progress Notes (Signed)
Subjective: CC:Hip pain PCP: Catherine Ip, DO HPI:Catherine Mayer is a 72 y.o. female presenting to clinic today for:  1.  Buttock pain Patient reports pain that radiates from the bottom of her buttocks down into the posterior thigh bilaterally with right more affected than left.  She feels like these pains are more spastic than anything.  She has mentioned this to her orthopedist before but they did not think that there was anything wrong with her.  She notes that the symptoms are random and she worries about blood clot but denies any changes in size of the thighs, erythema, warmth or induration.  The pain is worsened by sitting and getting up from a seated position but relieved by standing and walking.  No weakness, no sensory changes.  2.  Anxiety disorder Compliant with Lexapro.  Continues to use Ativan sparingly.  Does not report any excessive daytime sedation, falls or respiratory depression.  3.  Hypothyroidism Compliant with Synthroid.  No reports of tremor, heart palpitations or changes in bowel habits  4.  Hypertension Compliant with Norvasc, Diovan-HCTZ.  No chest pain, shortness of breath, swelling, dizziness or falls   ROS: Per HPI  No Known Allergies Past Medical History:  Diagnosis Date   Anxiety    Arthritis    hands, knees   Depression    GERD (gastroesophageal reflux disease)    Hyperlipidemia    Hypertension    Hypothyroidism    Thyroid disease    Trimalleolar fracture of ankle, closed, right, initial encounter     Current Outpatient Medications:    amLODipine (NORVASC) 5 MG tablet, Take 1 tablet (5 mg total) by mouth daily., Disp: 100 tablet, Rfl: 3   azelastine (ASTELIN) 0.1 % nasal spray, Place 1 spray into both nostrils 2 (two) times daily. For runny nose (Patient not taking: Reported on 11/03/2022), Disp: 30 mL, Rfl: 12   escitalopram (LEXAPRO) 20 MG tablet, Take 1 tablet (20 mg total) by mouth daily., Disp: 100 tablet, Rfl: 3   levocetirizine  (XYZAL) 5 MG tablet, Take 1 tablet (5 mg total) by mouth at bedtime as needed for allergies. (Patient not taking: Reported on 11/03/2022), Disp: 90 tablet, Rfl: 3   levothyroxine (SYNTHROID) 137 MCG tablet, TAKE 1 TABLET BY MOUTH DAILY  BEFORE BREAKFAST, Disp: 100 tablet, Rfl: 3   LORazepam (ATIVAN) 0.5 MG tablet, Take 1 tablet (0.5 mg total) by mouth at bedtime as needed for anxiety (place on HOLD. pt does not need yet.). (Patient not taking: Reported on 11/03/2022), Disp: 90 tablet, Rfl: 1   omeprazole (PRILOSEC) 20 MG capsule, TAKE 1 CAPSULE BY MOUTH DAILY, Disp: 100 capsule, Rfl: 3   triamcinolone cream (KENALOG) 0.1 %, APPLY TOPICALLY TWICE DAILY FOR 7 TO 10 DAYS, Disp: 30 g, Rfl: 0   valsartan-hydrochlorothiazide (DIOVAN-HCT) 160-25 MG tablet, Take 1 tablet by mouth daily., Disp: 100 tablet, Rfl: 3 Social History   Socioeconomic History   Marital status: Widowed    Spouse name: [name]   Number of children: 1   Years of education: Not on file   Highest education level: Associate degree: occupational, Scientist, product/process development, or vocational program  Occupational History   Occupation: Control and instrumentation engineer    Comment: part time  Tobacco Use   Smoking status: Never   Smokeless tobacco: Never  Vaping Use   Vaping status: Never Used  Substance and Sexual Activity   Alcohol use: Not Currently   Drug use: Never   Sexual activity: Not on file  Other  Topics Concern   Not on file  Social History Narrative   Husband passed away 02-22-2021   Enjoys playing with dogs and being outside.    Social Determinants of Health   Financial Resource Strain: Low Risk  (10/19/2022)   Overall Financial Resource Strain (CARDIA)    Difficulty of Paying Living Expenses: Not hard at all  Food Insecurity: No Food Insecurity (10/19/2022)   Hunger Vital Sign    Worried About Running Out of Food in the Last Year: Never true    Ran Out of Food in the Last Year: Never true  Transportation Needs: No Transportation Needs (10/19/2022)    PRAPARE - Administrator, Civil Service (Medical): No    Lack of Transportation (Non-Medical): No  Physical Activity: Inactive (10/19/2022)   Exercise Vital Sign    Days of Exercise per Week: 3 days    Minutes of Exercise per Session: 0 min  Stress: Stress Concern Present (10/19/2022)   [employer] of Occupational Health - Occupational Stress Questionnaire    Feeling of Stress : To some extent  Social Connections: Moderately Integrated (10/19/2022)   Social Connection and Isolation Panel [NHANES]    Frequency of Communication with Friends and Family: Twice a week    Frequency of Social Gatherings with Friends and Family: Once a week    Attends Religious Services: More than 4 times per year    Active Member of [employer] or Organizations: Yes    Attends Banker Meetings: More than 4 times per year    Marital Status: Widowed  Recent Concern: Social Connections - Moderately Isolated (09/02/2022)   Social Connection and Isolation Panel [NHANES]    Frequency of Communication with Friends and Family: More than three times a week    Frequency of Social Gatherings with Friends and Family: More than three times a week    Attends Religious Services: More than 4 times per year    Active Member of [employer] or Organizations: No    Attends Banker Meetings: Never    Marital Status: Widowed  Intimate Partner Violence: Not At Risk (09/02/2022)   Humiliation, Afraid, Rape, and Kick questionnaire    Fear of Current or Ex-Partner: No    Emotionally Abused: No    Physically Abused: No    Sexually Abused: No   Family History  Problem Relation Age of Onset   Hypertension Mother    Hyperlipidemia Mother    Hypertension Father    Hypertension Sister    Hypertension Sister    Hypertension Sister    Hypertension Son    Hyperlipidemia Son    Colon cancer Neg Hx    Liver disease Neg Hx    Esophageal cancer Neg Hx    Breast cancer Neg Hx     Objective: Office vital  signs reviewed. BP 122/82   Pulse 75   Temp 98.2 F (36.8 C)   Ht 5\' 2"  (1.575 m)   Wt 156 lb 12.8 oz (71.1 kg)   SpO2 96%   BMI 28.68 kg/m   Physical Examination:  General: Awake, alert, well nourished, No acute distress HEENT: Sclera white.  Moist mucous membranes.  No exophthalmos.  No goiter Cardio: regular rate and rhythm, S1S2 heard, no murmurs appreciated Pulm: clear to auscultation bilaterally, no wheezes, rhonchi or rales; normal work of breathing on room air MSK: Normal gait and station.  No palpable defects.  No tenderness to palpation to the ischial bursa or lumbar spine.  Mild tenderness palpation along the posterior right thigh laterally but no palpable defects.  Tone is normal  Assessment/ Plan: 72 y.o. female   Hypothyroidism due to acquired atrophy of thyroid - Plan: TSH, T4, Free  Mixed hyperlipidemia - Plan: CMP14+EGFR, Lipid Panel  Musculoskeletal thigh pain, right - Plan: tiZANidine (ZANAFLEX) 4 MG tablet  Essential hypertension with goal blood pressure less than 130/80 - Plan: amLODipine (NORVASC) 5 MG tablet  Generalized anxiety disorder with panic attacks - Plan: LORazepam (ATIVAN) 0.5 MG tablet  Primary insomnia - Plan: LORazepam (ATIVAN) 0.5 MG tablet  Check labs.  Continue current thyroid medication unless labs mandate otherwise  Continue statin, BP meds.  Uncertain etiology of thigh pain but we discussed consideration for physical therapy versus evaluation with sports medicine under ultrasound.  Blood pressure is controlled.  Medications have been renewed  Anxiety is chronic and stable.  UDS and CSA are up-to-date.  National narcotic database reviewed and there were no red flags.  Ativan renewed for sparing use.    Catherine Ip, DO [facility] [phone]

## 2023-02-17 NOTE — Patient Instructions (Signed)
Muscle Cramps and Spasms Muscle cramps and spasms happen when muscles tighten on their own. They can be in any muscle. They happen most often in the muscles in the back of your lower leg (calf). Muscle cramps are painful. They are often stronger and last longer than muscle spasms. Muscle spasms may or may not be painful. In many cases, the cause of a muscle cramp or spasm is not known. But it may be from: Doing more work or exercise than your body is ready for. Using the muscles too much (overuse). Staying in one position for too long. Not preparing enough or having bad form when you play a sport or do an activity. Getting hurt. Other causes may include: Not enough water in your body (dehydration). Taking certain medicines. Not having enough salts and minerals in the body (electrolytes). Follow these instructions at home: Eating and drinking Drink enough fluid to keep your pee (urine) pale yellow. Eat a healthy diet to help your muscles work well. Your diet should include: Fruits and vegetables. Lean protein. Whole grains. Low-fat or nonfat dairy products. Managing pain and stiffness     Massage, stretch, and relax the muscle. Do this for a few minutes at a time. If told, put ice on the muscle. This may help if you are sore or have pain after a cramp or spasm. Put ice in a plastic bag. Place a towel between your skin and the bag. Leave the ice on for 20 minutes, 2-3 times a day. If told, put heat on tight or tense muscles. Do this as often as told by your doctor. Use the heat source that your doctor recommends, such as a moist heat pack or a heating pad. Place a towel between your skin and the heat source. Leave the heat on for 20-30 minutes. If your skin turns bright red, take off the ice or heat right away to prevent skin damage. The risk of damage is higher if you cannot feel pain, heat, or cold. Take hot showers or baths to help relax the muscles. General instructions If you  are having cramps often, avoid intense exercise for a few days. Take over-the-counter and prescription medicines only as told by your doctor. Watch for any changes in your symptoms. Contact a doctor if: Your cramps or spasms get worse or happen more often. Your cramps or spasms do not get better with time. This information is not intended to replace advice given to you by your health care provider. Make sure you discuss any questions you have with your health care provider. Document Revised: 02/11/2022 Document Reviewed: 02/11/2022 Elsevier Patient Education  2024 [employer].

## 2023-03-02 ENCOUNTER — Ambulatory Visit: Payer: Medicare Other | Admitting: Family Medicine

## 2023-06-15 ENCOUNTER — Telehealth: Payer: Self-pay | Admitting: Family Medicine

## 2023-06-15 NOTE — Telephone Encounter (Signed)
Copied from CRM [phone] [id number]. Topic: General - Other >> Jun 15, 2023  2:13 PM Larwance Sachs wrote: Reason for CRM: Patient called in regarding to speaking with Dr. Nadine Counts when she is available. Stated she just need to speak with her briefly

## 2023-06-16 NOTE — Telephone Encounter (Signed)
Left vm for cb  If pt calls back please get her questions / concerns

## 2023-06-22 ENCOUNTER — Ambulatory Visit: Payer: Self-pay | Admitting: Family Medicine

## 2023-06-22 NOTE — Progress Notes (Signed)
Acute Office Visit  Subjective:     Patient ID: Catherine Mayer, female    DOB: [birth year], 72 y.o.   MRN: [id number]  Chief Complaint  Patient presents with   Anxiety    Having anxiety for the past couples months    HPI Catherine Mayer is a 72 yrs old female present on 06/23/2023 for an acute visit concerns for increase anxiety'. She has a dx of anxiety " My grandson got married, they use to leave with and moved out and they moved out and he has not spoke to me since then, I am worry. Her husband died around Christmas and I am  worry about everyone leaving her". reports 2-3 panic attacks weekly, but have not been PRN Ativan.Her anxiety is currently being managed with Lexapro and reports adherence to the medication " it has work well," Denies any side effect She reports getting 7-8 hrs of sleep..   Denies SI/HI  HTN She has a dx HTN and BP 155/81, denies any HA, change in vision. Reports that she didn't taher hypertensive medications this morning. " My grand daughter is in nursing school and she has been checking my BP and has been normal" Advise client to avoid sodium  Hypertension, follow-up  BP Readings from Last 3 Encounters:  06/23/23 (!) 155/81  02/17/23 122/82  11/03/22 (!) 139/94   Wt Readings from Last 3 Encounters:  06/23/23 155 lb 6.4 oz (70.5 kg)  02/17/23 156 lb 12.8 oz (71.1 kg)  11/03/22 157 lb (71.2 kg)     She was last seen for hypertension, months ago.  BP at that visit was 122/82. Management since that visit includes amlodipine 5 mg and Diovan-HCT 160-25 mg. She reports excellent compliance with treatment. She is not having side effects.  She is following a Regular diet. She is not exercising. She does not smoke. Use of agents associated with hypertension: none.  Outside blood pressures are 130//78. Symptoms: No chest pain No chest pressure  No palpitations No syncope  No dyspnea No orthopnea  No paroxysmal nocturnal dyspnea No lower extremity edema    Pertinent labs Lab Results  Component Value Date   CHOL 301 (H) 02/17/2023   HDL 47 02/17/2023   LDLCALC 220 (H) 02/17/2023   TRIG 176 (H) 02/17/2023   CHOLHDL 6.4 (H) 02/17/2023   Lab Results  Component Value Date   NA 141 02/17/2023   K 3.8 02/17/2023   CREATININE 0.92 02/17/2023   EGFR 66 02/17/2023   GLUCOSE 96 02/17/2023   TSH 75.100 (H) 02/17/2023     The 10-year ASCVD risk score (Arnett DK, et al., 2019) is: 24.1%      Active Ambulatory Problems    Diagnosis Date Noted   Hypothyroidism 12/19/2013   Choking 12/29/2013   Generalized anxiety disorder with panic attacks 05/18/2019   Essential hypertension with goal blood pressure less than 130/80 07/22/2016   Hyperlipidemia 07/22/2016   Recurrent major depressive disorder, in full remission (HCC) 07/22/2016   Vitamin D deficiency 07/22/2016   Panic disorder 09/13/2018   Anxiety, generalized 09/13/2018   Closed trimalleolar fracture of right ankle 06/15/2020   Vaginal intraepithelial neoplasia 08/30/2021   Osteopenia of left femoral neck 04/22/2022   Resolved Ambulatory Problems    Diagnosis Date Noted   No Resolved Ambulatory Problems   Past Medical History:  Diagnosis Date   Anxiety    Arthritis    Depression    GERD (gastroesophageal reflux disease)    Hypertension  Thyroid disease    Trimalleolar fracture of ankle, closed, right, initial encounter     ROS Negative unless indicated in HPI    Objective:    BP (!) 155/81   Pulse 75   Temp 97.6 F (36.4 C) (Temporal)   Ht 5\' 2"  (1.575 m)   Wt 155 lb 6.4 oz (70.5 kg)   SpO2 95%   BMI 28.42 kg/m  BP Readings from Last 3 Encounters:  06/23/23 (!) 155/81  02/17/23 122/82  11/03/22 (!) 139/94   Wt Readings from Last 3 Encounters:  06/23/23 155 lb 6.4 oz (70.5 kg)  02/17/23 156 lb 12.8 oz (71.1 kg)  11/03/22 157 lb (71.2 kg)      Physical Exam Vitals and nursing note reviewed.  Constitutional:      Appearance: Normal appearance.   HENT:     Head: Normocephalic and atraumatic.     Nose: Nose normal.     Mouth/Throat:     Mouth: Mucous membranes are moist.  Eyes:     General: No scleral icterus.    Extraocular Movements: Extraocular movements intact.     Conjunctiva/sclera: Conjunctivae normal.     Pupils: Pupils are equal, round, and reactive to light.  Cardiovascular:     Rate and Rhythm: Normal rate and regular rhythm.  Pulmonary:     Effort: Pulmonary effort is normal.     Breath sounds: Normal breath sounds.  Skin:    General: Skin is warm and dry.     Findings: No rash.  Neurological:     Mental Status: She is alert and oriented to person, place, and time. Mental status is at baseline.  Psychiatric:        Attention and Perception: Attention and perception normal.        Mood and Affect: Mood is anxious.        Speech: Speech normal.        Behavior: Behavior normal. Behavior is cooperative.        Thought Content: Thought content does not include homicidal or suicidal ideation. Thought content does not include homicidal or suicidal plan.        Cognition and Memory: Cognition and memory normal.        Judgment: Judgment normal.     No results found for any visits on 06/23/23.      Assessment & Plan:  Generalized anxiety disorder with panic attacks  Encounter for immunization -     Flu Vaccine Trivalent High Dose (Fluad)    Catherine Mayer is 72 yrs old Caucasian female was seen today fro increase anxiety and panic attacks no acute distress Continue Lexapro and use already prescribed Ativan PRN for panic attack HTN: Continue Amlodipine 5 mg daily  and Diovan HCT 160-25 mg daily  Continue healthy lifestyle choices, including diet (rich in fruits, vegetables, and lean proteins, and low in salt and simple carbohydrates) and exercise (at least 30 minutes of moderate physical activity daily).     The above assessment and management plan was discussed with the patient. The patient verbalized understanding  of and has agreed to the management plan. Patient is aware to call the clinic if they develop any new symptoms or if symptoms persist or worsen. Patient is aware when to return to the clinic for a follow-up visit. Patient educated on when it is appropriate to go to the emergency department.  Goal BP:  For patients younger than 60: Goal BP < 140/90. For patients 60 and older: Goal  BP < 150/90. For patients with diabetes: Goal BP < 140/90.  Take your medications faithfully as prescribed. Maintain a healthy weight. Get at least 150 minutes of aerobic exercise per week. Minimize salt intake, less than 2000 mg per day. Minimize alcohol intake.  DASH Eating Plan DASH stands for "Dietary Approaches to Stop Hypertension." The DASH eating plan is a healthy eating plan that has been shown to reduce high blood pressure (hypertension). Additional health benefits may include reducing the risk of type 2 diabetes mellitus, heart disease, and stroke. The DASH eating plan may also help with weight loss.  WHAT DO I NEED TO KNOW ABOUT THE DASH EATING PLAN? For the DASH eating plan, you will follow these general guidelines: Choose foods with a percent daily value for sodium of less than 5% (as listed on the food label). Use salt-free seasonings or herbs instead of table salt or sea salt. Check with your health care provider or pharmacist before using salt substitutes. Eat lower-sodium products, often labeled as "lower sodium" or "no salt added." Eat fresh foods. Eat more vegetables, fruits, and low-fat dairy products. Choose whole grains. Look for the word "whole" as the first word in the ingredient list. Choose fish and skinless chicken or [place] more often than red meat. Limit fish, poultry, and meat to 6 oz (170 g) each day. Limit sweets, desserts, sugars, and sugary drinks. Choose heart-healthy fats. Limit cheese to 1 oz (28 g) per day. Eat more home-cooked food and less restaurant, buffet, and fast  food. Limit fried foods. Cook foods using methods other than frying. Limit canned vegetables. If you do use them, rinse them well to decrease the sodium. When eating at a restaurant, ask that your food be prepared with less salt, or no salt if possible.  WHAT FOODS CAN I EAT? Seek help from a dietitian for individual calorie needs.  Grains Whole grain or whole wheat bread. Brown rice. Whole grain or whole wheat pasta. Quinoa, bulgur, and whole grain cereals. Low-sodium cereals. Corn or whole wheat flour tortillas. Whole grain cornbread. Whole grain crackers. Low-sodium crackers.  Vegetables Fresh or frozen vegetables (raw, steamed, roasted, or grilled). Low-sodium or reduced-sodium tomato and vegetable juices. Low-sodium or reduced-sodium tomato sauce and paste. Low-sodium or reduced-sodium canned vegetables.   Fruits All fresh, canned (in natural juice), or frozen fruits.  Meat and Other Protein Products Ground beef (85% or leaner), grass-fed beef, or beef trimmed of fat. Skinless chicken or [place]. Ground chicken or [place]. Pork trimmed of fat. All fish and seafood. Eggs. Dried beans, peas, or lentils. Unsalted nuts and seeds. Unsalted canned beans.  Dairy Low-fat dairy products, such as skim or 1% milk, 2% or reduced-fat cheeses, low-fat ricotta or cottage cheese, or plain low-fat yogurt. Low-sodium or reduced-sodium cheeses.  Fats and Oils Tub margarines without trans fats. Light or reduced-fat mayonnaise and salad dressings (reduced sodium). Avocado. Safflower, olive, or canola oils. Natural peanut or almond butter.  Other Unsalted popcorn and pretzels. The items listed above may not be a complete list of recommended foods or beverages. Contact your dietitian for more options.  WHAT FOODS ARE NOT RECOMMENDED?  Grains White bread. White pasta. White rice. Refined cornbread. Bagels and croissants. Crackers that contain trans fat.  Vegetables Creamed or fried vegetables.  Vegetables in a cheese sauce. Regular canned vegetables. Regular canned tomato sauce and paste. Regular tomato and vegetable juices.  Fruits Dried fruits. Canned fruit in light or heavy syrup. Fruit juice.  Meat and Other Protein  Products Fatty cuts of meat. Ribs, chicken wings, bacon, sausage, bologna, salami, chitterlings, fatback, hot dogs, bratwurst, and packaged luncheon meats. Salted nuts and seeds. Canned beans with salt.  Dairy Whole or 2% milk, cream, half-and-half, and cream cheese. Whole-fat or sweetened yogurt. Full-fat cheeses or blue cheese. Nondairy creamers and whipped toppings. Processed cheese, cheese spreads, or cheese curds.  Condiments Onion and garlic salt, seasoned salt, table salt, and sea salt. Canned and packaged gravies. Worcestershire sauce. Tartar sauce. Barbecue sauce. Teriyaki sauce. Soy sauce, including reduced sodium. Steak sauce. Fish sauce. Oyster sauce. Cocktail sauce. Horseradish. Ketchup and mustard. Meat flavorings and tenderizers. Bouillon cubes. Hot sauce. Tabasco sauce. Marinades. Taco seasonings. Relishes.  Fats and Oils Butter, stick margarine, lard, shortening, ghee, and bacon fat. Coconut, palm kernel, or palm oils. Regular salad dressings.  Other Pickles and olives. Salted popcorn and pretzels.  The items listed above may not be a complete list of foods and beverages to avoid. Contact your dietitian for more information.  WHERE CAN I FIND MORE INFORMATION? National Heart, Lung, and Blood Institute: [employer] Document Released: 06/12/2011 Document Revised: 11/07/2013 Document Reviewed: 04/27/2013 [facility] Patient Information 2015 [place], [place]. This information is not intended to replace advice given to you by your health care provider. Make sure you discuss any questions you have with your health care provider.   I think that you would greatly benefit from seeing a nutritionist.  If you are  interested, please call [address] [place], [place] [phone] to schedule an appointment.  Return if symptoms worsen or fail to improve.  Arrie Aran [place], [place] [facility] Medicine [address] [place], [place] [address] [phone]  Note: This document was prepared by [employer] voice dictation technology and any errors that results from this process are unintentional.

## 2023-06-22 NOTE — Telephone Encounter (Signed)
Chief Complaint: restlessness intermittently, nervous Symptoms: starts to feel restless, like she can't get everything done  Frequency: intermittently for 3 months. Pertinent Negatives: Patient denies denies SOB, denies diaphoresis, denies CP Disposition: [] ED /[] Urgent Care (no appt availability in office) / [x] Appointment(In office/virtual)/ []  [place] [facility]/ [] Home Care/ [] Refused Recommended Disposition /[] [place] [facility]/ []  Follow-up with PCP  Additional Notes: pt states this is the time of year her husband passed, states she feels she cannot get all her tasks done, she controls her breathing and the issue resolves. She states that nothing seems to make better/worse. Not worse with caffeine. Advised to call back if she experiences s/s again.  Advised to call 911 or go to ED if she feels SOB, has diaphoresis, or feels like she may pass out.  Pt agreeable and appt sched for tomorrow.   Copied from CRM [id number]. Topic: Clinical - Red Word Triage >> Jun 22, 2023  9:49 AM Joanette Gula wrote: Red Word Elevated Heart Rate Reason for Disposition  [1] Anxiety symptoms AND [2] has not been evaluated for this by doctor (or NP/PA)  Answer Assessment - Initial Assessment Questions 1. CONCERN: "Did anything happen that prompted you to call today?"      This time of year 2. ANXIETY SYMPTOMS: "Can you describe how you (your loved one; patient) have been feeling?" (e.g., tense, restless, panicky, anxious, keyed up, overwhelmed, sense of impending doom).      Jumping out of skin,  3. ONSET: "How long have you been feeling this way?" (e.g., hours, days, weeks)     Intermittently for approx 3 months 4. SEVERITY: "How would you rate the level of anxiety?" (e.g., 0 - 10; or mild, moderate, severe).     5 5. FUNCTIONAL IMPAIRMENT: "How have these feelings affected your ability to do daily activities?" "Have you had more difficulty than usual doing your normal daily activities?" (e.g., getting  better, same, worse; self-care, school, work, interactions)     Can continue what she is doing 6. HISTORY: "Have you felt this way before?" "Have you ever been diagnosed with an anxiety problem in the past?" (e.g., generalized anxiety disorder, panic attacks, PTSD). If Yes, ask: "How was this problem treated?" (e.g., medicines, counseling, etc.)     Hx of panic attack 7. RISK OF HARM - SUICIDAL IDEATION: "Do you ever have thoughts of hurting or killing yourself?" If Yes, ask:  "Do you have these feelings now?" "Do you have a plan on how you would do this?"     denies 8. TREATMENT:  "What has been done so far to treat this anxiety?" (e.g., medicines, relaxation strategies). "What has helped?"     Resolves on own, slow breathing 9. TREATMENT - THERAPIST: "Do you have a counselor or therapist? Name?"     denies 10. POTENTIAL TRIGGERS: "Do you drink caffeinated beverages (e.g., coffee, colas, teas), and how much daily?" "Do you drink alcohol or use any drugs?" "Have you started any new medicines recently?"       Denies, does drink coffee, but doesn't feel that it happens 11. PATIENT SUPPORT: "Who is with you now?" "Who do you live with?" "Do you have family or friends who you can talk to?"        Lives alone, has support system. 12. OTHER SYMPTOMS: "Do you have any other symptoms?" (e.g., feeling depressed, trouble concentrating, trouble sleeping, trouble breathing, palpitations or fast heartbeat, chest pain, sweating, nausea, or diarrhea)  denies  Protocols used: Anxiety and Panic Attack-A-AH

## 2023-06-23 ENCOUNTER — Encounter: Payer: Self-pay | Admitting: Nurse Practitioner

## 2023-06-23 ENCOUNTER — Ambulatory Visit (INDEPENDENT_AMBULATORY_CARE_PROVIDER_SITE_OTHER): Payer: Medicare Other | Admitting: Nurse Practitioner

## 2023-06-23 VITALS — BP 155/81 | HR 75 | Temp 97.6°F | Ht 62.0 in | Wt 155.4 lb

## 2023-06-23 DIAGNOSIS — F410 Panic disorder [episodic paroxysmal anxiety] without agoraphobia: Secondary | ICD-10-CM | POA: Diagnosis not present

## 2023-06-23 DIAGNOSIS — F411 Generalized anxiety disorder: Secondary | ICD-10-CM

## 2023-06-23 DIAGNOSIS — L900 Lichen sclerosus et atrophicus: Secondary | ICD-10-CM | POA: Diagnosis not present

## 2023-06-23 DIAGNOSIS — Z23 Encounter for immunization: Secondary | ICD-10-CM | POA: Diagnosis not present

## 2023-06-23 NOTE — Patient Instructions (Signed)
Goal BP:  For patients younger than 60: Goal BP < 140/90. For patients 60 and older: Goal BP < 150/90. For patients with diabetes: Goal BP < 140/90.  Take your medications faithfully as prescribed. Maintain a healthy weight. Get at least 150 minutes of aerobic exercise per week. Minimize salt intake, less than 2000 mg per day. Minimize alcohol intake.  DASH Eating Plan DASH stands for "Dietary Approaches to Stop Hypertension." The DASH eating plan is a healthy eating plan that has been shown to reduce high blood pressure (hypertension). Additional health benefits may include reducing the risk of type 2 diabetes mellitus, heart disease, and stroke. The DASH eating plan may also help with weight loss.  WHAT DO I NEED TO KNOW ABOUT THE DASH EATING PLAN? For the DASH eating plan, you will follow these general guidelines: Choose foods with a percent daily value for sodium of less than 5% (as listed on the food label). Use salt-free seasonings or herbs instead of table salt or sea salt. Check with your health care provider or pharmacist before using salt substitutes. Eat lower-sodium products, often labeled as "lower sodium" or "no salt added." Eat fresh foods. Eat more vegetables, fruits, and low-fat dairy products. Choose whole grains. Look for the word "whole" as the first word in the ingredient list. Choose fish and skinless chicken or [place] more often than red meat. Limit fish, poultry, and meat to 6 oz (170 g) each day. Limit sweets, desserts, sugars, and sugary drinks. Choose heart-healthy fats. Limit cheese to 1 oz (28 g) per day. Eat more home-cooked food and less restaurant, buffet, and fast food. Limit fried foods. Cook foods using methods other than frying. Limit canned vegetables. If you do use them, rinse them well to decrease the sodium. When eating at a restaurant, ask that your food be prepared with less salt, or no salt if possible.  WHAT FOODS CAN I EAT? Seek help from  a dietitian for individual calorie needs.  Grains Whole grain or whole wheat bread. Brown rice. Whole grain or whole wheat pasta. Quinoa, bulgur, and whole grain cereals. Low-sodium cereals. Corn or whole wheat flour tortillas. Whole grain cornbread. Whole grain crackers. Low-sodium crackers.  Vegetables Fresh or frozen vegetables (raw, steamed, roasted, or grilled). Low-sodium or reduced-sodium tomato and vegetable juices. Low-sodium or reduced-sodium tomato sauce and paste. Low-sodium or reduced-sodium canned vegetables.   Fruits All fresh, canned (in natural juice), or frozen fruits.  Meat and Other Protein Products Ground beef (85% or leaner), grass-fed beef, or beef trimmed of fat. Skinless chicken or [place]. Ground chicken or [place]. Pork trimmed of fat. All fish and seafood. Eggs. Dried beans, peas, or lentils. Unsalted nuts and seeds. Unsalted canned beans.  Dairy Low-fat dairy products, such as skim or 1% milk, 2% or reduced-fat cheeses, low-fat ricotta or cottage cheese, or plain low-fat yogurt. Low-sodium or reduced-sodium cheeses.  Fats and Oils Tub margarines without trans fats. Light or reduced-fat mayonnaise and salad dressings (reduced sodium). Avocado. Safflower, olive, or canola oils. Natural peanut or almond butter.  Other Unsalted popcorn and pretzels. The items listed above may not be a complete list of recommended foods or beverages. Contact your dietitian for more options.  WHAT FOODS ARE NOT RECOMMENDED?  Grains White bread. White pasta. White rice. Refined cornbread. Bagels and croissants. Crackers that contain trans fat.  Vegetables Creamed or fried vegetables. Vegetables in a cheese sauce. Regular canned vegetables. Regular canned tomato sauce and paste. Regular tomato and vegetable juices.  Fruits Dried fruits. Canned fruit in light or heavy syrup. Fruit juice.  Meat and Other Protein Products Fatty cuts of meat. Ribs, chicken wings, bacon, sausage,  bologna, salami, chitterlings, fatback, hot dogs, bratwurst, and packaged luncheon meats. Salted nuts and seeds. Canned beans with salt.  Dairy Whole or 2% milk, cream, half-and-half, and cream cheese. Whole-fat or sweetened yogurt. Full-fat cheeses or blue cheese. Nondairy creamers and whipped toppings. Processed cheese, cheese spreads, or cheese curds.  Condiments Onion and garlic salt, seasoned salt, table salt, and sea salt. Canned and packaged gravies. Worcestershire sauce. Tartar sauce. Barbecue sauce. Teriyaki sauce. Soy sauce, including reduced sodium. Steak sauce. Fish sauce. Oyster sauce. Cocktail sauce. Horseradish. Ketchup and mustard. Meat flavorings and tenderizers. Bouillon cubes. Hot sauce. Tabasco sauce. Marinades. Taco seasonings. Relishes.  Fats and Oils Butter, stick margarine, lard, shortening, ghee, and bacon fat. Coconut, palm kernel, or palm oils. Regular salad dressings.  Other Pickles and olives. Salted popcorn and pretzels.  The items listed above may not be a complete list of foods and beverages to avoid. Contact your dietitian for more information.  WHERE CAN I FIND MORE INFORMATION? National Heart, Lung, and Blood Institute: [employer] Document Released: 06/12/2011 Document Revised: 11/07/2013 Document Reviewed: 04/27/2013 [facility] Patient Information 2015 [place], [place]. This information is not intended to replace advice given to you by your health care provider. Make sure you discuss any questions you have with your health care provider.   I think that you would greatly benefit from seeing a nutritionist.  If you are interested, please call [address] [place], [place] [phone] to schedule an appointment.

## 2023-08-07 ENCOUNTER — Encounter: Payer: Self-pay | Admitting: Family Medicine

## 2023-08-26 ENCOUNTER — Encounter: Payer: Medicare Other | Admitting: Family Medicine

## 2023-08-26 ENCOUNTER — Ambulatory Visit (INDEPENDENT_AMBULATORY_CARE_PROVIDER_SITE_OTHER): Payer: Medicare Other | Admitting: Family Medicine

## 2023-08-26 ENCOUNTER — Encounter: Payer: Self-pay | Admitting: Family Medicine

## 2023-08-26 ENCOUNTER — Ambulatory Visit: Payer: Medicare Other | Admitting: Family Medicine

## 2023-08-26 VITALS — BP 139/86 | HR 78 | Temp 98.5°F | Ht 62.0 in | Wt 157.0 lb

## 2023-08-26 DIAGNOSIS — E782 Mixed hyperlipidemia: Secondary | ICD-10-CM

## 2023-08-26 DIAGNOSIS — F5101 Primary insomnia: Secondary | ICD-10-CM

## 2023-08-26 DIAGNOSIS — F3342 Major depressive disorder, recurrent, in full remission: Secondary | ICD-10-CM

## 2023-08-26 DIAGNOSIS — F410 Panic disorder [episodic paroxysmal anxiety] without agoraphobia: Secondary | ICD-10-CM | POA: Diagnosis not present

## 2023-08-26 DIAGNOSIS — F411 Generalized anxiety disorder: Secondary | ICD-10-CM

## 2023-08-26 DIAGNOSIS — I10 Essential (primary) hypertension: Secondary | ICD-10-CM

## 2023-08-26 DIAGNOSIS — M79651 Pain in right thigh: Secondary | ICD-10-CM | POA: Diagnosis not present

## 2023-08-26 DIAGNOSIS — E034 Atrophy of thyroid (acquired): Secondary | ICD-10-CM

## 2023-08-26 DIAGNOSIS — Z79891 Long term (current) use of opiate analgesic: Secondary | ICD-10-CM | POA: Diagnosis not present

## 2023-08-26 MED ORDER — VALSARTAN-HYDROCHLOROTHIAZIDE 160-25 MG PO TABS: 1.0000 | ORAL_TABLET | Freq: Every day | ORAL | 3 refills | Status: DC

## 2023-08-26 MED ORDER — LEVOTHYROXINE SODIUM 137 MCG PO TABS: ORAL_TABLET | ORAL | 3 refills | Status: DC

## 2023-08-26 MED ORDER — LORAZEPAM 0.5 MG PO TABS
0.5000 mg | ORAL_TABLET | Freq: Every evening | ORAL | 1 refills | Status: DC | PRN
Start: 2023-10-18 — End: 2024-04-11

## 2023-08-26 MED ORDER — ESCITALOPRAM OXALATE 20 MG PO TABS: 20.0000 mg | ORAL_TABLET | Freq: Every day | ORAL | 3 refills | Status: DC

## 2023-08-26 MED ORDER — OMEPRAZOLE 20 MG PO CPDR: 20.0000 mg | DELAYED_RELEASE_CAPSULE | Freq: Every day | ORAL | 3 refills | Status: DC

## 2023-08-26 MED ORDER — TIZANIDINE HCL 4 MG PO TABS: 4.0000 mg | ORAL_TABLET | Freq: Four times a day (QID) | ORAL | 0 refills | Status: DC | PRN

## 2023-08-26 MED ORDER — AMLODIPINE BESYLATE 5 MG PO TABS
5.0000 mg | ORAL_TABLET | Freq: Every day | ORAL | 3 refills | Status: DC
Start: 2023-08-26 — End: 2024-04-11

## 2023-08-26 NOTE — Progress Notes (Signed)
 Subjective: CC: Hypothyroidism PCP: [name], DO HPI:Catherine Mayer is a 73 y.o. female presenting to clinic today for:  1.  Hypothyroidism Patient had been taking her thyroid medication every other day which resulted in abnormal thyroid levels last visit.  She since has been taking it every day.  No tremor, heart palpitations, changes in bowel habits or energy  2.  Hyperlipidemia associate with hypertension Her LDL was noted to be over 200 last visit.  Some of this was thought to be contributed by her abnormal thyroid levels so we were waiting until today to repeat levels.  She denies any chest pain, shortness of breath, abdominal pain or jaundice.  She is compliant with her antihypertensives   3.  Generalized anxiety disorder with panic attack Patient uses Ativan as needed bedtime as this is when her mind is difficult to shut off.  No memory changes reported.  4.  Thigh pain She reports that the tizanidine really helped relieve the thigh pain.  She uses this very sparingly.  Denies any excessive daytime sedation off of medication   ROS: Per HPI  No Known Allergies Past Medical History:  Diagnosis Date   Anxiety    Arthritis    hands, knees   Depression    GERD (gastroesophageal reflux disease)    Hyperlipidemia    Hypertension    Hypothyroidism    Thyroid disease    Trimalleolar fracture of ankle, closed, right, initial encounter     Current Outpatient Medications:    amLODipine (NORVASC) 5 MG tablet, Take 1 tablet (5 mg total) by mouth daily., Disp: 100 tablet, Rfl: 3   escitalopram (LEXAPRO) 20 MG tablet, Take 1 tablet (20 mg total) by mouth daily., Disp: 100 tablet, Rfl: 3   levothyroxine (SYNTHROID) 137 MCG tablet, TAKE 1 TABLET BY MOUTH  DAILY BEFORE BREAKFAST, Disp: 100 tablet, Rfl: 3   LORazepam (ATIVAN) 0.5 MG tablet, Take 1 tablet (0.5 mg total) by mouth at bedtime as needed for anxiety (place on HOLD. pt does not need yet.)., Disp: 90 tablet, Rfl: 1    omeprazole (PRILOSEC) 20 MG capsule, Take 1 capsule (20 mg total) by mouth daily., Disp: 100 capsule, Rfl: 3   valsartan-hydrochlorothiazide (DIOVAN-HCT) 160-25 MG tablet, Take 1 tablet by mouth daily., Disp: 100 tablet, Rfl: 3 Social History   Socioeconomic History   Marital status: Widowed    Spouse name: [name]   Number of children: 1   Years of education: Not on file   Highest education level: Associate degree: occupational, Scientist, product/process development, or vocational program  Occupational History   Occupation: Control and instrumentation engineer    Comment: part time  Tobacco Use   Smoking status: Never   Smokeless tobacco: Never  Vaping Use   Vaping status: Never Used  Substance and Sexual Activity   Alcohol use: Not Currently   Drug use: Never   Sexual activity: Not on file  Other Topics Concern   Not on file  Social History Narrative   Husband passed away 2020/09/09   Enjoys playing with dogs and being outside.    Social Drivers of Corporate investment banker Strain: Low Risk  (08/25/2023)   Overall Financial Resource Strain (CARDIA)    Difficulty of Paying Living Expenses: Not very hard  Food Insecurity: No Food Insecurity (08/25/2023)   Hunger Vital Sign    Worried About Running Out of Food in the Last Year: Never true    Ran Out of Food in the Last Year: Never true  Transportation Needs: No Transportation Needs (08/25/2023)   PRAPARE - Administrator, Civil Service (Medical): No    Lack of Transportation (Non-Medical): No  Physical Activity: Insufficiently Active (08/25/2023)   Exercise Vital Sign    Days of Exercise per Week: 3 days    Minutes of Exercise per Session: 30 min  Stress: No Stress Concern Present (08/25/2023)   [employer] of Occupational Health - Occupational Stress Questionnaire    Feeling of Stress : Only a little  Social Connections: Moderately Integrated (08/25/2023)   Social Connection and Isolation Panel [NHANES]    Frequency of Communication with Friends and Family:  More than three times a week    Frequency of Social Gatherings with Friends and Family: Once a week    Attends Religious Services: More than 4 times per year    Active Member of [employer] or Organizations: Yes    Attends Banker Meetings: More than 4 times per year    Marital Status: Widowed  Intimate Partner Violence: Not At Risk (09/02/2022)   Humiliation, Afraid, Rape, and Kick questionnaire    Fear of Current or Ex-Partner: No    Emotionally Abused: No    Physically Abused: No    Sexually Abused: No   Family History  Problem Relation Age of Onset   Hypertension Mother    Hyperlipidemia Mother    Hypertension Father    Hypertension Sister    Hypertension Sister    Hypertension Sister    Hypertension Son    Hyperlipidemia Son    Colon cancer Neg Hx    Liver disease Neg Hx    Esophageal cancer Neg Hx    Breast cancer Neg Hx     Objective: Office vital signs reviewed. BP 139/86   Pulse 78   Temp 98.5 F (36.9 C)   Ht 5\' 2"  (1.575 m)   Wt 157 lb (71.2 kg)   SpO2 98%   BMI 28.72 kg/m   Physical Examination:  General: Awake, alert, well nourished, No acute distress HEENT: Sclera white.  Moist mucous membranes.  No exophthalmos.  No goiter. Cardio: regular rate and rhythm, S1S2 heard, no murmurs appreciated Pulm: clear to auscultation bilaterally, no wheezes, rhonchi or rales; normal work of breathing on room air Neuro: No tremor.  Alert and oriented     08/26/2023    7:58 AM 02/17/2023    8:00 AM 10/22/2022    2:10 PM  Depression screen PHQ 2/9  Decreased Interest 1 1 1   Down, Depressed, Hopeless 1 1 1   PHQ - 2 Score 2 2 2   Altered sleeping 1 0 0  Tired, decreased energy 0 1 1  Change in appetite 0 0 0  Feeling bad or failure about yourself  0 1 1  Trouble concentrating 1 0 1  Moving slowly or fidgety/restless 0 0 0  Suicidal thoughts 0 0 1  PHQ-9 Score 4 4 6   Difficult doing work/chores Somewhat difficult Not difficult at all Not difficult at all       08/26/2023    7:58 AM 02/17/2023    8:01 AM 10/22/2022    2:09 PM 09/25/2022    8:39 AM  GAD 7 : Generalized Anxiety Score  Nervous, Anxious, on Edge 1 1 1 1   Control/stop worrying 1 2 2 1   Worry too much - different things 1 2 2 2   Trouble relaxing 1 3 1 1   Restless 0 2 0 0  Easily annoyed or irritable 0  0 1 0  Afraid - awful might happen 0 2 1 1   Total GAD 7 Score 4 12 8 6   Anxiety Difficulty  Not difficult at all Somewhat difficult Somewhat difficult   Assessment/ Plan: 73 y.o. female   Generalized anxiety disorder with panic attacks - Plan: ToxASSURE Select 13 (MW), Urine, LORazepam (ATIVAN) 0.5 MG tablet, escitalopram (LEXAPRO) 20 MG tablet  Recurrent major depressive disorder, in full remission (HCC) - Plan: escitalopram (LEXAPRO) 20 MG tablet  Primary insomnia - Plan: LORazepam (ATIVAN) 0.5 MG tablet  Hypothyroidism due to acquired atrophy of thyroid - Plan: TSH + free T4, levothyroxine (SYNTHROID) 137 MCG tablet  Essential hypertension with goal blood pressure less than 130/80 - Plan: amLODipine (NORVASC) 5 MG tablet, valsartan-hydrochlorothiazide (DIOVAN-HCT) 160-25 MG tablet  Mixed hyperlipidemia - Plan: Lipid Panel  Musculoskeletal thigh pain, right - Plan: tiZANidine (ZANAFLEX) 4 MG tablet  UDS and CSA were updated as per office policy.  Her anxiety disorder is chronic and stable.  I have renewed all medications.  National narcotic database reviewed and there were no red flags.  She did have 2 instances where there was about a week early refill but these are sent through mail order and I think this was more so to ensure that she would get delivery during the holidays.  No concerns for overuse or misuse of benzodiazepine  Thyroid levels collected.  Synthroid renewed.  No hyper- or hypothyroid symptoms  Blood pressure controlled.  Medications renewed.  Repeat fasting lipid.  Will consider statin pending result  Tizanidine effective for musculoskeletal pain and  she is able to utilize this without any concerning side effects.  Medication renewed  Follow-up in 6 months for annual physical with fasting labs.  Plan for MMSE at that visit given chronic utilization of benzodiazepine  [name], DO [facility] [phone]

## 2023-08-27 ENCOUNTER — Other Ambulatory Visit: Payer: Self-pay | Admitting: Family Medicine

## 2023-08-27 DIAGNOSIS — E034 Atrophy of thyroid (acquired): Secondary | ICD-10-CM

## 2023-08-27 LAB — LIPID PANEL
Chol/HDL Ratio: 5.5 {ratio} — ABNORMAL HIGH (ref 0.0–4.4)
Cholesterol, Total: 297 mg/dL — ABNORMAL HIGH (ref 100–199)
HDL: 54 mg/dL (ref >39)
LDL Chol Calc (NIH): 209 mg/dL — ABNORMAL HIGH (ref 0–99)
Triglycerides: 181 mg/dL — ABNORMAL HIGH (ref 0–149)
VLDL Cholesterol Cal: 34 mg/dL (ref 5–40)

## 2023-08-27 LAB — TSH+FREE T4
Free T4: 1.32 ng/dL (ref 0.82–1.77)
TSH: 7.84 u[IU]/mL — ABNORMAL HIGH (ref 0.450–4.500)

## 2023-08-27 MED ORDER — LEVOTHYROXINE SODIUM 137 MCG PO TABS: ORAL_TABLET | ORAL | Status: DC

## 2023-08-29 ENCOUNTER — Encounter: Payer: Self-pay | Admitting: Family Medicine

## 2023-08-29 LAB — TOXASSURE SELECT 13 (MW), URINE

## 2023-08-31 ENCOUNTER — Encounter: Payer: Self-pay | Admitting: Family Medicine

## 2023-10-15 ENCOUNTER — Encounter: Payer: Self-pay | Admitting: Family Medicine

## 2023-10-19 DIAGNOSIS — X32XXXA Exposure to sunlight, initial encounter: Secondary | ICD-10-CM | POA: Diagnosis not present

## 2023-10-19 DIAGNOSIS — L570 Actinic keratosis: Secondary | ICD-10-CM | POA: Diagnosis not present

## 2023-10-19 DIAGNOSIS — D225 Melanocytic nevi of trunk: Secondary | ICD-10-CM | POA: Diagnosis not present

## 2023-11-10 ENCOUNTER — Encounter: Payer: Self-pay | Admitting: Family Medicine

## 2023-11-10 ENCOUNTER — Ambulatory Visit (INDEPENDENT_AMBULATORY_CARE_PROVIDER_SITE_OTHER): Admitting: Family Medicine

## 2023-11-10 VITALS — BP 145/96 | HR 80 | Temp 97.4°F | Ht 62.0 in | Wt 155.0 lb

## 2023-11-10 DIAGNOSIS — E034 Atrophy of thyroid (acquired): Secondary | ICD-10-CM | POA: Diagnosis not present

## 2023-11-10 DIAGNOSIS — W57XXXA Bitten or stung by nonvenomous insect and other nonvenomous arthropods, initial encounter: Secondary | ICD-10-CM | POA: Diagnosis not present

## 2023-11-10 MED ORDER — DOXYCYCLINE HYCLATE 100 MG PO CAPS: 100.0000 mg | ORAL_CAPSULE | Freq: Two times a day (BID) | ORAL | 0 refills | Status: DC

## 2023-11-10 NOTE — Progress Notes (Signed)
 Subjective:  Patient ID: Catherine Mayer, female    DOB: 03/09/1951  Age: 73 y.o. MRN: [id number]  CC: Tick Bite (3 different ticks in the past 3 weeks. Pt reports that she has been feeling pretty fatigued for about a week and half. Spots itch but no rash.  )   HPI [name] presents for three areas of tick bite two recent, removed from the upper left chest, another from the right neck. Neither was engorged, but were attached. Te third was on the upper back on the left. It was there several days because pt. Just felt the itch and couldn't reach the spot. Daughter removed it 2 weeks ago. Pt. Feeling tired, run down. Denies fever and rash.    follow-up on  thyroid . The patient has a history of hypothyroidism for many years. It has been stable recently. Pt. denies any change in  voice, loss of hair, heat or cold intolerance. Energy level has been adequate to good. Patient denies constipation and diarrhea. No myxedema. Medication is as noted below. Verified that pt is taking it daily on an empty stomach. Well tolerated.      11/10/2023   11:42 AM 08/26/2023    7:58 AM 02/17/2023    8:00 AM  Depression screen PHQ 2/9  Decreased Interest 2 1 1   Down, Depressed, Hopeless 2 1 1   PHQ - 2 Score 4 2 2   Altered sleeping 0 1 0  Tired, decreased energy 3 0 1  Change in appetite 1 0 0  Feeling bad or failure about yourself  0 0 1  Trouble concentrating 2 1 0  Moving slowly or fidgety/restless 0 0 0  Suicidal thoughts 0 0 0  PHQ-9 Score 10 4 4   Difficult doing work/chores Not difficult at all Somewhat difficult Not difficult at all    History Avyana has a past medical history of Anxiety, Arthritis, Closed trimalleolar fracture of right ankle (06/15/2020), Depression, GERD (gastroesophageal reflux disease), Hyperlipidemia, Hypertension, Hypothyroidism, Thyroid  disease, and Trimalleolar fracture of ankle, closed, right, initial encounter.   She has a past surgical history that includes Cholecystectomy;  Abdominal hysterectomy; Carpal tunnel release (Bilateral, 2013); and ORIF ankle fracture (Right, 06/14/2020).   Her family history includes Hyperlipidemia in her mother and son; Hypertension in her father, mother, sister, sister, sister, and son.She reports that she has never smoked. She has never used smokeless tobacco. She reports that she does not currently use alcohol. She reports that she does not use drugs.    ROS Review of Systems  Constitutional: Negative.  Negative for fever.  HENT: Negative.    Eyes:  Negative for visual disturbance.  Respiratory:  Negative for shortness of breath.   Cardiovascular:  Negative for chest pain.  Gastrointestinal:  Negative for abdominal pain.  Musculoskeletal:  Negative for arthralgias.  Skin:  Negative for rash.    Objective:  BP (!) 145/96   Pulse 80   Temp (!) 97.4 F (36.3 C)   Ht 5\' 2"  (1.575 m)   Wt 155 lb (70.3 kg)   SpO2 95%   BMI 28.35 kg/m   BP Readings from Last 3 Encounters:  11/10/23 (!) 145/96  08/26/23 139/86  06/23/23 (!) 155/81    Wt Readings from Last 3 Encounters:  11/10/23 155 lb (70.3 kg)  08/26/23 157 lb (71.2 kg)  06/23/23 155 lb 6.4 oz (70.5 kg)     Physical Exam Constitutional:      General: She is not in acute distress.  Appearance: She is well-developed.  Cardiovascular:     Rate and Rhythm: Normal rate and regular rhythm.  Pulmonary:     Breath sounds: Normal breath sounds.  Musculoskeletal:        General: Normal range of motion.  Skin:    General: Skin is warm and dry.     Findings: No rash.  Neurological:     Mental Status: She is alert and oriented to person, place, and time.      Assessment & Plan:  Hypothyroidism due to acquired atrophy of thyroid  -     CBC with Differential/Platelet -     CMP14+EGFR -     TSH + free T4  Tick bite, initial encounter -     CBC with Differential/Platelet -     Lyme Disease Serology w/Reflex  Other orders -     Doxycycline Hyclate; Take 1  capsule (100 mg total) by mouth 2 (two) times daily.  Dispense: 20 capsule; Refill: 0     Follow-up: Return if symptoms worsen or fail to improve.  Roise Cleaver, M.D.

## 2023-11-11 ENCOUNTER — Encounter: Payer: Self-pay | Admitting: Family Medicine

## 2023-11-11 LAB — CMP14+EGFR
ALT: 18 IU/L (ref 0–32)
AST: 17 IU/L (ref 0–40)
Albumin: 4.3 g/dL (ref 3.8–4.8)
Alkaline Phosphatase: 104 IU/L (ref 44–121)
BUN/Creatinine Ratio: 17 (ref 12–28)
BUN: 15 mg/dL (ref 8–27)
Bilirubin Total: 0.7 mg/dL (ref 0.0–1.2)
CO2: 25 mmol/L (ref 20–29)
Calcium: 9.3 mg/dL (ref 8.7–10.3)
Chloride: 97 mmol/L (ref 96–106)
Creatinine, Ser: 0.9 mg/dL (ref 0.57–1.00)
Globulin, Total: 2.6 g/dL (ref 1.5–4.5)
Glucose: 86 mg/dL (ref 70–99)
Potassium: 3.2 mmol/L — ABNORMAL LOW (ref 3.5–5.2)
Sodium: 138 mmol/L (ref 134–144)
Total Protein: 6.9 g/dL (ref 6.0–8.5)
eGFR: 68 mL/min/{1.73_m2} (ref >59)

## 2023-11-11 LAB — TSH+FREE T4
Free T4: 2.17 ng/dL — ABNORMAL HIGH (ref 0.82–1.77)
TSH: 0.466 u[IU]/mL (ref 0.450–4.500)

## 2023-11-11 LAB — CBC WITH DIFFERENTIAL/PLATELET

## 2023-11-11 LAB — LYME DISEASE SEROLOGY W/REFLEX: Lyme Total Antibody EIA: NEGATIVE

## 2023-11-17 ENCOUNTER — Ambulatory Visit: Payer: Self-pay

## 2023-12-14 ENCOUNTER — Ambulatory Visit (INDEPENDENT_AMBULATORY_CARE_PROVIDER_SITE_OTHER)

## 2023-12-14 VITALS — BP 145/96 | HR 80 | Ht 62.0 in | Wt 155.0 lb

## 2023-12-14 DIAGNOSIS — Z0000 Encounter for general adult medical examination without abnormal findings: Secondary | ICD-10-CM

## 2023-12-14 DIAGNOSIS — Z1231 Encounter for screening mammogram for malignant neoplasm of breast: Secondary | ICD-10-CM

## 2023-12-14 NOTE — Progress Notes (Signed)
 Subjective:   Catherine Mayer is a 73 y.o. who presents for a Medicare Wellness preventive visit.  As a reminder, Annual Wellness Visits don't include a physical exam, and some assessments may be limited, especially if this visit is performed virtually. We may recommend an in-person follow-up visit with your provider if needed.  Visit Complete: Virtual I connected with  Catherine Mayer on 12/14/23 by a audio enabled telemedicine application and verified that I am speaking with the correct person using two identifiers.  Patient Location: Home  Provider Location: Home Office  I discussed the limitations of evaluation and management by telemedicine. The patient expressed understanding and agreed to proceed.  Vital Signs: Because this visit was a virtual/telehealth visit, some criteria may be missing or patient reported. Any vitals not documented were not able to be obtained and vitals that have been documented are patient reported.  VideoDeclined- This patient declined Librarian, academic. Therefore the visit was completed with audio only.  Persons Participating in Visit: Patient.  AWV Questionnaire: No: Patient Medicare AWV questionnaire was not completed prior to this visit.  Cardiac Risk Factors include: advanced age (>2men, >58 women);dyslipidemia     Objective:     Today's Vitals   12/14/23 1314  BP: 111/78  Pulse: 96  Weight: 168 lb (76.2 kg)  Height: 5\' 2"  (1.575 m)   Body mass index is 30.73 kg/m.     12/14/2023    1:24 PM 09/02/2022    9:18 AM 08/30/2021    9:43 AM 08/29/2020   10:01 AM 06/14/2020   11:46 AM 06/11/2020   11:36 AM 06/10/2020    4:16 PM  Advanced Directives  Does Patient Have a Medical Advance Directive? No Yes No No No No No  Type of Furniture conservator/restorer;Living will       Copy of Healthcare Power of Attorney in Chart?  No - copy requested       Would patient like information on creating a medical advance  directive?   No - Patient declined No - Patient declined No - Patient declined No - Patient declined No - Patient declined    Current Medications (verified) Outpatient Encounter Medications as of 12/14/2023  Medication Sig   amLODipine  (NORVASC ) 5 MG tablet Take 1 tablet (5 mg total) by mouth daily.   escitalopram  (LEXAPRO ) 20 MG tablet Take 1 tablet (20 mg total) by mouth daily.   levothyroxine  (SYNTHROID ) 137 MCG tablet TAKE 1 TABLET BY MOUTH  DAILY BEFORE BREAKFAST Except take 1.5 tabs Sundays   LORazepam  (ATIVAN ) 0.5 MG tablet Take 1 tablet (0.5 mg total) by mouth at bedtime as needed for anxiety (MUST LAST 90 DAYS!).   omeprazole  (PRILOSEC) 20 MG capsule Take 1 capsule (20 mg total) by mouth daily.   tiZANidine  (ZANAFLEX ) 4 MG tablet Take 1 tablet (4 mg total) by mouth every 6 (six) hours as needed for muscle spasms.   valsartan -hydrochlorothiazide  (DIOVAN -HCT) 160-25 MG tablet Take 1 tablet by mouth daily.   doxycycline  (VIBRAMYCIN ) 100 MG capsule Take 1 capsule (100 mg total) by mouth 2 (two) times daily. (Patient not taking: Reported on 12/14/2023)   No facility-administered encounter medications on file as of 12/14/2023.    Allergies (verified) Patient has no known allergies.   History: Past Medical History:  Diagnosis Date   Anxiety    Arthritis    hands, knees   Closed trimalleolar fracture of right ankle 06/15/2020   Depression    GERD (  gastroesophageal reflux disease)    Hyperlipidemia    Hypertension    Hypothyroidism    Thyroid  disease    Trimalleolar fracture of ankle, closed, right, initial encounter    Past Surgical History:  Procedure Laterality Date   ABDOMINAL HYSTERECTOMY     CARPAL TUNNEL RELEASE Bilateral 2011-12-25   CHOLECYSTECTOMY     ORIF ANKLE FRACTURE Right 06/14/2020   Procedure: Open treatment right ankle trimalleolar fracture with internal fixation;  Surgeon: Amada Backer, MD;  Location: [facility];  Service: Orthopedics;  Laterality:  Right;    Family History  Problem Relation Age of Onset   Hypertension Mother    Hyperlipidemia Mother    Hypertension Father    Hypertension Sister    Hypertension Sister    Hypertension Sister    Hypertension Son    Hyperlipidemia Son    Colon cancer Neg Hx    Liver disease Neg Hx    Esophageal cancer Neg Hx    Breast cancer Neg Hx    Social History   Socioeconomic History   Marital status: Widowed    Spouse name: Catherine Mayer   Number of children: 1   Years of education: Not on file   Highest education level: Associate degree: occupational, Scientist, product/process development, or vocational program  Occupational History   Occupation: Control and instrumentation engineer    Comment: part time  Tobacco Use   Smoking status: Never   Smokeless tobacco: Never  Vaping Use   Vaping status: Never Used  Substance and Sexual Activity   Alcohol use: Not Currently   Drug use: Never   Sexual activity: Not on file  Other Topics Concern   Not on file  Social History Narrative   Husband passed away 12-24-20   Enjoys playing with dogs and being outside.    Social Drivers of Corporate investment banker Strain: Low Risk  (12/14/2023)   Overall Financial Resource Strain (CARDIA)    Difficulty of Paying Living Expenses: Not hard at all  Food Insecurity: No Food Insecurity (12/14/2023)   Hunger Vital Sign    Worried About Running Out of Food in the Last Year: Never true    Ran Out of Food in the Last Year: Never true  Transportation Needs: No Transportation Needs (12/14/2023)   PRAPARE - Administrator, Civil Service (Medical): No    Lack of Transportation (Non-Medical): No  Physical Activity: Insufficiently Active (08/25/2023)   Exercise Vital Sign    Days of Exercise per Week: 3 days    Minutes of Exercise per Session: 30 min  Stress: No Stress Concern Present (12/14/2023)   [employer] of Occupational Health - Occupational Stress Questionnaire    Feeling of Stress : Only a little  Social Connections: Moderately  Integrated (12/14/2023)   Social Connection and Isolation Panel [NHANES]    Frequency of Communication with Friends and Family: More than three times a week    Frequency of Social Gatherings with Friends and Family: More than three times a week    Attends Religious Services: More than 4 times per year    Active Member of [employer] or Organizations: Yes    Attends Banker Meetings: More than 4 times per year    Marital Status: Widowed    Tobacco Counseling Counseling given: Yes    Clinical Intake:  Pre-visit preparation completed: Yes  Pain : No/denies pain     BMI - recorded: 36.76 Nutritional Status: BMI > 30  Obese Nutritional Risks:  None Diabetes: No  No results found for: "HGBA1C"   How often do you need to have someone help you when you read instructions, pamphlets, or other written materials from your doctor or pharmacy?: 1 - Never  Interpreter Needed?: No  Information entered by :: alia t/cma   Activities of Daily Living     12/14/2023    1:21 PM  In your present state of health, do you have any difficulty performing the following activities:  Hearing? 0  Vision? 1  Difficulty concentrating or making decisions? 0  Walking or climbing stairs? 0  Dressing or bathing? 0  Doing errands, shopping? 0  Preparing Food and eating ? N  Using the Toilet? N  In the past six months, have you accidently leaked urine? N  Do you have problems with loss of bowel control? N  Managing your Medications? N  Managing your Finances? N  Housekeeping or managing your Housekeeping? N    Patient Care Team: Eliodoro Guerin, DO as PCP - General (Family Medicine) Meisinger, Ena Harries, MD as Consulting Physician (Obstetrics and Gynecology)  I have updated your Care Teams any recent Medical Services you may have received from other providers in the past year.     Assessment:    This is a routine wellness examination for Catherine Mayer.  Hearing/Vision screen Hearing Screening -  Comments:: Pt denies hearing dif  Vision Screening - Comments:: Pt wears glasses and stated that sometimes its hard to see with. Pt goes [facility] [place]/last appt yr. Upcoming appt per pt   Goals Addressed             This Visit's Progress    Patient Stated   On track    Wants to go on a cruise       Depression Screen     12/14/2023    1:29 PM 11/10/2023   11:42 AM 08/26/2023    7:58 AM 02/17/2023    8:00 AM 10/22/2022    2:10 PM 09/25/2022    8:38 AM 09/02/2022    9:17 AM  PHQ 2/9 Scores  PHQ - 2 Score 1 4 2 2 2 2  0  PHQ- 9 Score 1 10 4 4 6 3      Fall Risk     12/14/2023    1:20 PM 08/26/2023    7:53 AM 02/17/2023    8:00 AM 10/22/2022    2:06 PM 09/02/2022    9:16 AM  Fall Risk   Falls in the past year? 0 0 0 0 0  Number falls in past yr: 0 0  0 0  Injury with Fall? 0 0  0 0  Risk for fall due to : No Fall Risks No Fall Risks  No Fall Risks No Fall Risks  Follow up Falls evaluation completed Falls evaluation completed  Education provided Falls prevention discussed    MEDICARE RISK AT HOME:  Medicare Risk at Home Any stairs in or around the home?: No If so, are there any without handrails?: No Home free of loose throw rugs in walkways, pet beds, electrical cords, etc?: Yes Adequate lighting in your home to reduce risk of falls?: Yes Life alert?: No Use of a cane, walker or w/c?: No Grab bars in the bathroom?: Yes Shower chair or bench in shower?: Yes Elevated toilet seat or a handicapped toilet?: Yes  TIMED UP AND GO:  Was the test performed?  no  Cognitive Function: 6CIT completed  12/14/2023    1:44 PM 09/02/2022    9:18 AM 08/29/2020    9:53 AM 08/29/2019    9:48 AM  6CIT Screen  What Year? 0 points 0 points 0 points 0 points  What month? 0 points 0 points 0 points 0 points  What time? 0 points 0 points 0 points 0 points  Count back from 20 0 points 0 points 0 points 0 points  Months in reverse 0 points 0 points 0 points 0 points  Repeat  phrase 0 points 0 points 0 points 0 points  Total Score 0 points 0 points 0 points 0 points    Immunizations Immunization History  Administered Date(s) Administered   Fluad Trivalent(High Dose 65+) 06/23/2023   Influenza, High Dose Seasonal PF 07/22/2016, 03/18/2017, 03/22/2019   Influenza,inj,Quad PF,6+ Mos 05/10/2018   Influenza,inj,quad, With Preservative 04/06/2017   Moderna SARS-COV2 Booster Vaccination 09/12/2020   Moderna Sars-Covid-2 Vaccination 08/31/2019, 09/28/2019   Pneumococcal Conjugate-13 07/22/2016   Pneumococcal Polysaccharide-23 05/18/2019   Tdap 02/13/2015    Screening Tests Health Maintenance  Topic Date Due   Zoster Vaccines- Shingrix (1 of 2) Never done   MAMMOGRAM  11/17/2023   COVID-19 Vaccine (3 - Moderna risk series) 12/29/2024 (Originally 10/10/2020)   INFLUENZA VACCINE  02/05/2024   DEXA SCAN  04/22/2024   Medicare Annual Wellness (AWV)  12/13/2024   DTaP/Tdap/Td (2 - Td or Tdap) 02/12/2025   Colonoscopy  11/02/2029   Pneumonia Vaccine 34+ Years old  Completed   Hepatitis C Screening  Completed   HPV VACCINES  Aged Out   Meningococcal B Vaccine  Aged Out    Health Maintenance  Health Maintenance Due  Topic Date Due   Zoster Vaccines- Shingrix (1 of 2) Never done   MAMMOGRAM  11/17/2023   Health Maintenance Items Addressed: Mammogram ordered  Additional Screening:  Vision Screening: Recommended annual ophthalmology exams for early detection of glaucoma and other disorders of the eye. Would you like a referral to an eye doctor? No    Dental Screening: Recommended annual dental exams for proper oral hygiene  Community Resource Referral / Chronic Care Management: CRR required this visit?  No   CCM required this visit?  No   Plan:    I have personally reviewed and noted the following in the patient's chart:   Medical and social history Use of alcohol, tobacco or illicit drugs  Current medications and supplements including opioid  prescriptions. Patient is not currently taking opioid prescriptions. Functional ability and status Nutritional status Physical activity Advanced directives List of other physicians Hospitalizations, surgeries, and ER visits in previous 12 months Vitals Screenings to include cognitive, depression, and falls Referrals and appointments  In addition, I have reviewed and discussed with patient certain preventive protocols, quality metrics, and best practice recommendations. A written personalized care plan for preventive services as well as general preventive health recommendations were provided to patient.   Michaelle Adolphus, CMA   12/14/2023   After Visit Summary: (MyChart) Due to this being a telephonic visit, the after visit summary with patients personalized plan was offered to patient via MyChart   Notes: Please refer to Routing Comments.

## 2023-12-14 NOTE — Patient Instructions (Signed)
 Catherine Mayer , Thank you for taking time out of your busy schedule to complete your Annual Wellness Visit with me. I enjoyed our conversation and look forward to speaking with you again next year. I, as well as your care team,  appreciate your ongoing commitment to your health goals. Please review the following plan we discussed and let me know if I can assist you in the future. Your Game plan/ To Do List   Follow up Visits: Next Medicare AWV with our clinical staff: 12/14/23 at 12:30p.m. Next Office Visit with your provider: 04/11/24 at 8:00a.m.  Clinician Recommendations:  Aim for 30 minutes of exercise or brisk walking, 6-8 glasses of water, and 5 servings of fruits and vegetables each day.       This is a list of the screening recommended for you and due dates:  Health Maintenance  Topic Date Due   Zoster (Shingles) Vaccine (1 of 2) Never done   Mammogram  11/17/2023   COVID-19 Vaccine (3 - Moderna risk series) 12/29/2024*   Flu Shot  02/05/2024   DEXA scan (bone density measurement)  04/22/2024   Medicare Annual Wellness Visit  12/13/2024   DTaP/Tdap/Td vaccine (2 - Td or Tdap) 02/12/2025   Colon Cancer Screening  11/02/2029   Pneumonia Vaccine  Completed   Hepatitis C Screening  Completed   HPV Vaccine  Aged Out   Meningitis B Vaccine  Aged Out  *Topic was postponed. The date shown is not the original due date.    Advanced directives: (Declined) Advance directive discussed with you today. Even though you declined this today, please call our office should you change your mind, and we can give you the proper paperwork for you to fill out. Advance Care Planning is important because it:  [x]  Makes sure you receive the medical care that is consistent with your values, goals, and preferences  [x]  It provides guidance to your family and loved ones and reduces their decisional burden about whether or not they are making the right decisions based on your wishes.  Follow the link provided in  your after visit summary or read over the paperwork we have mailed to you to help you started getting your Advance Directives in place. If you need assistance in completing these, please reach out to us  so that we can help you!  See attachments for Preventive Care and Fall Prevention Tips.

## 2024-01-20 DIAGNOSIS — R062 Wheezing: Secondary | ICD-10-CM | POA: Diagnosis not present

## 2024-01-20 DIAGNOSIS — J069 Acute upper respiratory infection, unspecified: Secondary | ICD-10-CM | POA: Diagnosis not present

## 2024-01-20 DIAGNOSIS — J40 Bronchitis, not specified as acute or chronic: Secondary | ICD-10-CM | POA: Diagnosis not present

## 2024-02-03 ENCOUNTER — Encounter

## 2024-02-24 ENCOUNTER — Encounter: Payer: Self-pay | Admitting: Family Medicine

## 2024-02-24 NOTE — Telephone Encounter (Signed)
Left message for patient to call back to gather more information

## 2024-02-25 NOTE — Telephone Encounter (Signed)
 Ok to offer her a VIRTUAL visit with me tomorrow at 3pm (can call during lunch If needed) or offer DOD. Sounds like sciatica

## 2024-02-26 ENCOUNTER — Telehealth (INDEPENDENT_AMBULATORY_CARE_PROVIDER_SITE_OTHER): Admitting: Family Medicine

## 2024-02-26 ENCOUNTER — Encounter: Payer: Self-pay | Admitting: Family Medicine

## 2024-02-26 DIAGNOSIS — M79651 Pain in right thigh: Secondary | ICD-10-CM | POA: Diagnosis not present

## 2024-02-26 DIAGNOSIS — M79652 Pain in left thigh: Secondary | ICD-10-CM

## 2024-02-26 DIAGNOSIS — Z8249 Family history of ischemic heart disease and other diseases of the circulatory system: Secondary | ICD-10-CM

## 2024-02-26 NOTE — Progress Notes (Signed)
 MyChart Video visit  Subjective: CC:leg pain PCP: Jolinda Norene HERO, DO HPI:Catherine Mayer is a 73 y.o. female. Patient provides verbal consent for consult held via video.  Due to COVID-19 pandemic this visit was conducted virtually. This visit type was conducted due to national recommendations for restrictions regarding the COVID-19 Pandemic (e.g. social distancing, sheltering in place) in an effort to limit this patient's exposure and mitigate transmission in our community. All issues noted in this document were discussed and addressed.  A physical exam was not performed with this format.   Location of patient: work Location of provider: [facility] Others present for call: none  1. Leg pain She reports onset of bilateral posterior thigh pain.  She has used the tizanadine but it is too sedating and not working.  She wants to proceed with orthopedic evaluation.  She reports sensation of instability but no falls/ weakness.  2. FamHx ischemic heart disease Sister just had open heart surgery.  Apparently, her heart specialist said what she had was familial. She had a CAC 0 in 2024.  Patient denies CP, SOB.  ROS: Per HPI  No Known Allergies Past Medical History:  Diagnosis Date   Anxiety    Arthritis    hands, knees   Closed trimalleolar fracture of right ankle 06/15/2020   Depression    GERD (gastroesophageal reflux disease)    Hyperlipidemia    Hypertension    Hypothyroidism    Thyroid  disease    Trimalleolar fracture of ankle, closed, right, initial encounter     Current Outpatient Medications:    amLODipine  (NORVASC ) 5 MG tablet, Take 1 tablet (5 mg total) by mouth daily., Disp: 100 tablet, Rfl: 3   doxycycline  (VIBRAMYCIN ) 100 MG capsule, Take 1 capsule (100 mg total) by mouth 2 (two) times daily. (Patient not taking: Reported on 12/14/2023), Disp: 20 capsule, Rfl: 0   escitalopram  (LEXAPRO ) 20 MG tablet, Take 1 tablet (20 mg total) by mouth daily., Disp: 100 tablet, Rfl: 3    levothyroxine  (SYNTHROID ) 137 MCG tablet, TAKE 1 TABLET BY MOUTH  DAILY BEFORE BREAKFAST Except take 1.5 tabs Sundays, Disp: , Rfl:    LORazepam  (ATIVAN ) 0.5 MG tablet, Take 1 tablet (0.5 mg total) by mouth at bedtime as needed for anxiety (MUST LAST 90 DAYS!)., Disp: 90 tablet, Rfl: 1   omeprazole  (PRILOSEC) 20 MG capsule, Take 1 capsule (20 mg total) by mouth daily., Disp: 100 capsule, Rfl: 3   tiZANidine  (ZANAFLEX ) 4 MG tablet, Take 1 tablet (4 mg total) by mouth every 6 (six) hours as needed for muscle spasms., Disp: 60 tablet, Rfl: 0   valsartan -hydrochlorothiazide  (DIOVAN -HCT) 160-25 MG tablet, Take 1 tablet by mouth daily., Disp: 100 tablet, Rfl: 3  Gen: nontoxic female MSK: ambulating independently. Slightly antalgic gait.  Assessment/ Plan: 73 y.o. female   Pain in both thighs - Plan: Ambulatory referral to Orthopedic Surgery  Family history of ischemic heart disease (IHD) - Plan: Lipoprotein A (LPA)  Suspect lumbar etiology.  She declined additional meds.  Referral placed.  Start time: 12:55pm End time: 1:04pm  Total time spent on patient care (including video visit/ documentation): 10 minutes  Debhora Titus HERO Jolinda, DO [facility] [phone]

## 2024-03-14 ENCOUNTER — Other Ambulatory Visit: Payer: Self-pay | Admitting: Family Medicine

## 2024-03-14 ENCOUNTER — Ambulatory Visit
Admission: RE | Admit: 2024-03-14 | Discharge: 2024-03-14 | Disposition: A | Discharge status: Home / Self Care | Source: Ambulatory Visit | Attending: Family Medicine

## 2024-03-14 DIAGNOSIS — Z1231 Encounter for screening mammogram for malignant neoplasm of breast: Secondary | ICD-10-CM | POA: Diagnosis not present

## 2024-04-05 ENCOUNTER — Ambulatory Visit: Payer: Self-pay

## 2024-04-05 NOTE — Telephone Encounter (Signed)
 Appt made.

## 2024-04-05 NOTE — Telephone Encounter (Signed)
 Copied from CRM [id number]. Topic: Clinical - Red Word Triage >> Apr 05, 2024 10:50 AM Carlatta H wrote: [facility] that prompted transfer to Nurse Triage: Patient is having severe pain in right leg and lower back//   ----------------------------------------------------------------------- From previous Reason for Contact - Scheduling: Patient/patient representative is calling to schedule an appointment. Refer to attachments for appointment information. Reason for Disposition  [1] MODERATE back pain (e.g., interferes with normal activities) AND [2] present > 3 days  Answer Assessment - Initial Assessment Questions No available appts with PCP, offered alternative provider today, pt declined today's appt. Scheduled 04/06/24. Advised UC/ED if symptoms worsen  1. ONSET: When did the pain begin? (e.g., minutes, hours, days)     months 2. LOCATION: Where does it hurt? (upper, mid or lower back)     Lower back, legs; 3. SEVERITY: How bad is the pain?  (e.g., Scale 1-10; mild, moderate, or severe)     Moderate to severe 4. PATTERN: Is the pain constant? (e.g., yes, no; constant, intermittent)      intermittent 5. RADIATION: Does the pain shoot into your legs or somewhere else?     Back to legs 6. CAUSE:  What do you think is causing the back pain?      Already seen doctor for back pain, still going on, muscle relaxers makes me sleeply; can't take them 7. BACK OVERUSE:  Any recent lifting of heavy objects, strenuous work or exercise?     unsure 8. MEDICINES: What have you taken so far for the pain? (e.g., nothing, acetaminophen , NSAIDS)     no 9. NEUROLOGIC SYMPTOMS: Do you have any weakness, numbness, or problems with bowel/bladder control?     no 10. OTHER SYMPTOMS: Do you have any other symptoms? (e.g., fever, abdomen pain, burning with urination, blood in urine)       No; itching when urinating, denies abd pain, burning with urination, blood in urine  Protocols used: Back  Pain-A-AH

## 2024-04-06 ENCOUNTER — Ambulatory Visit (INDEPENDENT_AMBULATORY_CARE_PROVIDER_SITE_OTHER)

## 2024-04-06 ENCOUNTER — Encounter: Payer: Self-pay | Admitting: Family Medicine

## 2024-04-06 ENCOUNTER — Ambulatory Visit: Admitting: Family Medicine

## 2024-04-06 VITALS — BP 158/95 | HR 81 | Temp 97.8°F | Ht 62.0 in | Wt 155.5 lb

## 2024-04-06 DIAGNOSIS — M654 Radial styloid tenosynovitis [de Quervain]: Secondary | ICD-10-CM | POA: Diagnosis not present

## 2024-04-06 DIAGNOSIS — Z23 Encounter for immunization: Secondary | ICD-10-CM | POA: Diagnosis not present

## 2024-04-06 DIAGNOSIS — M4316 Spondylolisthesis, lumbar region: Secondary | ICD-10-CM | POA: Diagnosis not present

## 2024-04-06 DIAGNOSIS — M79652 Pain in left thigh: Secondary | ICD-10-CM

## 2024-04-06 DIAGNOSIS — M47816 Spondylosis without myelopathy or radiculopathy, lumbar region: Secondary | ICD-10-CM | POA: Diagnosis not present

## 2024-04-06 DIAGNOSIS — M419 Scoliosis, unspecified: Secondary | ICD-10-CM | POA: Diagnosis not present

## 2024-04-06 DIAGNOSIS — M79651 Pain in right thigh: Secondary | ICD-10-CM | POA: Diagnosis not present

## 2024-04-06 DIAGNOSIS — M5136 Other intervertebral disc degeneration, lumbar region with discogenic back pain only: Secondary | ICD-10-CM | POA: Diagnosis not present

## 2024-04-06 MED ORDER — DICLOFENAC SODIUM 1 % EX GEL: 2.0000 g | Freq: Three times a day (TID) | CUTANEOUS | Status: AC | PRN

## 2024-04-06 NOTE — Progress Notes (Signed)
 Subjective: CC: Thigh pain PCP: Jolinda Norene HERO, DO HPI:Catherine Mayer is a 73 y.o. female presenting to clinic today for:  Bilateral thigh pain/ wrist pain Persistent bilateral thigh pain.  She was seen on 02/26/2024 for same and we referred her back to [facility] as we work suspect this was likely coming from her back, though she notes she still has not heard from them but also has not contacted them herself.  She notes this continues to happen intermittently and sometimes her legs just feel like they want to give out from underneath her.  The right is worse than the left.  She denies any sensory changes or pins and needle sensation down the legs.  She has been utilizing the Zanaflex  intermittently and that does seem to help a little bit with the discomfort but it makes her really sleepy so she tries to avoid regular use.   She also reports left-sided wrist pain that is intermittent.  She is not sure if this is positional because she sleeps with her hand flexed under her neck.  She is right-hand dominant and does not feel like she really uses her left hand much.  She does not report any sensory changes.  She points to the distal radial aspect of the left wrist as the area of discomfort.  No preceding injury.  Utilizing some Tiger balm which does seem to help a little bit   ROS: Per HPI  No Known Allergies Past Medical History:  Diagnosis Date   Anxiety    Arthritis    hands, knees   Closed trimalleolar fracture of right ankle 06/15/2020   Depression    GERD (gastroesophageal reflux disease)    Hyperlipidemia    Hypertension    Hypothyroidism    Thyroid  disease    Trimalleolar fracture of ankle, closed, right, initial encounter     Current Outpatient Medications:    amLODipine  (NORVASC ) 5 MG tablet, Take 1 tablet (5 mg total) by mouth daily., Disp: 100 tablet, Rfl: 3   diclofenac  Sodium (VOLTAREN ) 1 % GEL, Apply 2 g topically 3 (three) times daily as needed (wrist pain)., Disp: ,  Rfl:    escitalopram  (LEXAPRO ) 20 MG tablet, Take 1 tablet (20 mg total) by mouth daily., Disp: 100 tablet, Rfl: 3   levothyroxine  (SYNTHROID ) 137 MCG tablet, TAKE 1 TABLET BY MOUTH  DAILY BEFORE BREAKFAST Except take 1.5 tabs Sundays, Disp: , Rfl:    LORazepam  (ATIVAN ) 0.5 MG tablet, Take 1 tablet (0.5 mg total) by mouth at bedtime as needed for anxiety (MUST LAST 90 DAYS!)., Disp: 90 tablet, Rfl: 1   omeprazole  (PRILOSEC) 20 MG capsule, Take 1 capsule (20 mg total) by mouth daily., Disp: 100 capsule, Rfl: 3   tiZANidine  (ZANAFLEX ) 4 MG tablet, Take 1 tablet (4 mg total) by mouth every 6 (six) hours as needed for muscle spasms., Disp: 60 tablet, Rfl: 0   valsartan -hydrochlorothiazide  (DIOVAN -HCT) 160-25 MG tablet, Take 1 tablet by mouth daily., Disp: 100 tablet, Rfl: 3   doxycycline  (VIBRAMYCIN ) 100 MG capsule, Take 1 capsule (100 mg total) by mouth 2 (two) times daily. 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    Social Drivers of Corporate investment banker Strain: Low Risk  (12/14/2023)   Overall Financial Resource Strain (CARDIA)    Difficulty of Paying Living Expenses: Not hard at all  Food Insecurity: No Food Insecurity (12/14/2023)   Hunger Vital Sign    Worried About Running Out of Food in the  Last Year: Never true    Ran Out of Food in the Last Year: Never true  Transportation Needs: No Transportation Needs (12/14/2023)   PRAPARE - Administrator, Civil Service (Medical): No    Lack of Transportation (Non-Medical): No  Physical Activity: Insufficiently Active (08/25/2023)   Exercise Vital Sign    Days of Exercise per Week: 3 days    Minutes of Exercise per Session: 30 min  Stress: No Stress Concern Present (12/14/2023)   [employer] of Occupational Health - Occupational Stress Questionnaire    Feeling of Stress : Only a little  Social Connections: Moderately Integrated (12/14/2023)   Social Connection and Isolation Panel    Frequency of Communication with Friends and Family: More than three times a week    Frequency of Social Gatherings with Friends and Family: More than three times a week    Attends Religious Services: More than 4 times per year    Active Member of [employer] or Organizations: Yes    Attends Banker Meetings: More than 4 times per year    Marital Status: Widowed  Intimate Partner Violence: Not At Risk (12/14/2023)   Humiliation, Afraid, Rape, and Kick questionnaire    Fear of Current or Ex-Partner: No    Emotionally Abused: No    Physically Abused: No    Sexually Abused: No   Family History  Problem Relation Age of Onset   Hypertension Mother    Hyperlipidemia Mother    Hypertension Father    Hypertension Sister    Hypertension Sister    Hypertension Sister    Hypertension Son    Hyperlipidemia Son    Colon cancer Neg Hx    Liver disease Neg Hx    Esophageal cancer Neg Hx    Breast cancer Neg Hx     Objective: Office vital signs reviewed. BP (!) 158/95   Pulse 81   Temp 97.8 F (36.6 C)   Ht 5' 2 (1.575 m)   Wt 155 lb 8 oz (70.5 kg)   SpO2 96%   BMI 28.44 kg/m   Physical Examination:  General: Awake, alert, well nourished, No acute distress MSK: Ambulating independently with normal gait and station.  She has  visible asymmetry in her shoulders with the right being higher riding than left.  Slight scoliotic curve noted in the Thoracics.  No midline tenderness palpation to the spine.  No tenderness palpation over the SI joint or ischial bursa.  She has some mild tenderness to palpation at the insertion site of the posterior hamstring on the right Left wrist: Positive Finkelstein test.  No gross swelling, erythema or warmth over the left wrist joint.  Has full active range of  motion  Assessment/ Plan: 73 y.o. female   Pain in both thighs - Plan: DG Lumbar Spine Complete  De Quervain's tenosynovitis, left - Plan: diclofenac  Sodium (VOLTAREN ) 1 % GEL  Encounter for immunization - Plan: Flu vaccine HIGH DOSE PF(Fluzone Trivalent)   Plain films obtained to further evaluate as it sounds like may be a radicular pathology.  Upon personal review of the x-rays she has multilevel degenerative disease with several levels of osteophyte formation and what appears to be a scoliotic curve and maybe a little listhesis noted in the lower segments.  I will reach out to referrals to inquire about that referral we placed a month ago to [facility] as patient has not heard from them.  We discussed how to treat the de Quervain's tenosynovitis and she was given a topical to use for inflammation.  Encouraged ice.  May need to consider slight bracing if she is having any overuse of that area.  We discussed that ultimately if she needs corticosteroid injection we can certainly give that.  Influenza vaccination administered today   Norene CHRISTELLA Fielding, DO [facility] [phone]

## 2024-04-06 NOTE — Patient Instructions (Signed)
 Irritation and Swelling of the Thumb (De Quervain's Syndrome): What to Know  Tommi Rumps Quervain's syndrome causes irritation and swelling of the tendon on the thumb side of the wrist. Tendons are strong tissues that connect muscle to bone.  The tendons in the hand go through a tunnel called a sheath. A slippery layer of tissue helps the tendons move through the sheath. With de Quervain's syndrome, the sheath swells or thickens. This can cause pressure and pain. De Quervain's syndrome is also called de Quervain's disease or de Quervain's tenosynovitis. What are the causes? The exact cause isn't known. It may be from overuse of the hand and wrist. What increases the risk? You're more likely to get [employer] syndrome if: You use your hands far more than normal. This includes if you do certain movements over and over, such as twisting your hand or using a tight grip. You're pregnant. You're female and middle-aged. You have rheumatoid arthritis. You have diabetes. What are the signs or symptoms? The main symptom is pain on the thumb side of your wrist. The pain may get worse when you grasp something or turn your wrist. You may also have: Pain that goes up your forearm. Swelling of your wrist and hand. Trouble moving your thumb and wrist. A feeling of snapping in the wrist. A cyst. This is a pocket of fluid. How is this diagnosed? You may be diagnosed based on your symptoms, medical history, and an exam. The exam may include a Lourena Simmonds test. This is when your health care provider pulls your thumb and wrist to see if it causes pain. You may also have tests. These may include: An X-ray. An MRI. An ultrasound. How is this treated? You may need to: Avoid doing things that: Cause pain or swelling. Cause you to make the same movements with your hand or thumb over and over. Take medicines. These may include: Medicines to help with pain. Steroids to help with irritation and swelling. Wear a  splint. Have surgery. This may be needed if other treatments don't work. Once the pain and swelling have gone down, you may start: Physical therapy. You may be given exercises to help with movement and strength in your wrist and thumb. Occupational therapy. This includes changing how you move your wrist. Follow these instructions at home: If you have a splint: Wear the splint as told. Take it off only if your provider says you can. Check the skin around it every day. Tell your provider if you see problems. Loosen the splint if your fingers tingle, are numb, or turn cold and blue. Keep the splint clean. If the splint isn't waterproof: Do not let it get wet. Cover it when you take a bath or shower. Use a cover that doesn't let any water in. Managing pain, stiffness, and swelling  Use ice or an ice pack as told. If you have a splint that you can take off, remove it only as told. Place a towel between your skin and the ice. Leave the ice on for 20 minutes, 2-3 times a day. If your skin turns red, take off the ice right away to prevent skin damage. The risk of damage is higher if you can't feel pain, heat, or cold. Move your fingers often to reduce stiffness and swelling. Raise your hand above the level of your heart while you're sitting or lying down. Use pillows as needed. General instructions Take your medicines only as told. Ask if it's OK for you to lift. Ask  when it's safe to drive if you have a splint on your hand. Ask what things are safe for you to do at home. Ask when you can go back to work or school. Where to find more information To learn more: Go to orthoinfo.aaos.org. Click "Search." Type "De Quervain's tenosynovitis" into the search bar. Contact a health care provider if: Your pain doesn't get better with medicine. Your pain gets worse. You get new symptoms. This information is not intended to replace advice given to you by your health care provider. Make sure you  discuss any questions you have with your health care provider. Document Revised: 02/16/2023 Document Reviewed: 02/16/2023 Elsevier Patient Education  2024 [employer].

## 2024-04-11 ENCOUNTER — Ambulatory Visit: Payer: Self-pay | Admitting: Family Medicine

## 2024-04-11 ENCOUNTER — Encounter: Payer: Self-pay | Admitting: Family Medicine

## 2024-04-11 ENCOUNTER — Ambulatory Visit: Payer: Medicare Other | Admitting: Family Medicine

## 2024-04-11 VITALS — BP 146/97 | HR 89 | Temp 97.6°F | Ht 62.0 in | Wt 155.1 lb

## 2024-04-11 DIAGNOSIS — E559 Vitamin D deficiency, unspecified: Secondary | ICD-10-CM

## 2024-04-11 DIAGNOSIS — E782 Mixed hyperlipidemia: Secondary | ICD-10-CM | POA: Diagnosis not present

## 2024-04-11 DIAGNOSIS — M47817 Spondylosis without myelopathy or radiculopathy, lumbosacral region: Secondary | ICD-10-CM

## 2024-04-11 DIAGNOSIS — M79651 Pain in right thigh: Secondary | ICD-10-CM | POA: Diagnosis not present

## 2024-04-11 DIAGNOSIS — Z0001 Encounter for general adult medical examination with abnormal findings: Secondary | ICD-10-CM

## 2024-04-11 DIAGNOSIS — F411 Generalized anxiety disorder: Secondary | ICD-10-CM

## 2024-04-11 DIAGNOSIS — E034 Atrophy of thyroid (acquired): Secondary | ICD-10-CM | POA: Diagnosis not present

## 2024-04-11 DIAGNOSIS — I10 Essential (primary) hypertension: Secondary | ICD-10-CM

## 2024-04-11 DIAGNOSIS — F410 Panic disorder [episodic paroxysmal anxiety] without agoraphobia: Secondary | ICD-10-CM

## 2024-04-11 DIAGNOSIS — Z0000 Encounter for general adult medical examination without abnormal findings: Secondary | ICD-10-CM

## 2024-04-11 DIAGNOSIS — M85852 Other specified disorders of bone density and structure, left thigh: Secondary | ICD-10-CM

## 2024-04-11 DIAGNOSIS — F5101 Primary insomnia: Secondary | ICD-10-CM

## 2024-04-11 DIAGNOSIS — F3342 Major depressive disorder, recurrent, in full remission: Secondary | ICD-10-CM

## 2024-04-11 MED ORDER — ESCITALOPRAM OXALATE 20 MG PO TABS: 20.0000 mg | ORAL_TABLET | Freq: Every day | ORAL | 3 refills | Status: AC

## 2024-04-11 MED ORDER — LORAZEPAM 0.5 MG PO TABS: 0.5000 mg | ORAL_TABLET | Freq: Every evening | ORAL | 1 refills | Status: AC | PRN

## 2024-04-11 MED ORDER — OMEPRAZOLE 20 MG PO CPDR
20.0000 mg | DELAYED_RELEASE_CAPSULE | Freq: Every day | ORAL | 3 refills | Status: AC
Start: 2024-04-11 — End: ?

## 2024-04-11 MED ORDER — AMLODIPINE BESYLATE 5 MG PO TABS: 5.0000 mg | ORAL_TABLET | Freq: Every day | ORAL | 3 refills | Status: AC

## 2024-04-11 MED ORDER — TIZANIDINE HCL 4 MG PO TABS: 4.0000 mg | ORAL_TABLET | Freq: Four times a day (QID) | ORAL | 0 refills | Status: AC | PRN

## 2024-04-11 MED ORDER — LEVOTHYROXINE SODIUM 137 MCG PO TABS: ORAL_TABLET | ORAL | Status: AC

## 2024-04-11 MED ORDER — VALSARTAN-HYDROCHLOROTHIAZIDE 160-25 MG PO TABS: 1.0000 | ORAL_TABLET | Freq: Every day | ORAL | 3 refills | Status: AC

## 2024-04-11 NOTE — Progress Notes (Signed)
 Catherine Mayer is a 73 y.o. female presents to office today for annual physical exam examination.    She reports that since she saw me last her sister died suddenly.  She thinks this is likely secondary to complications of heart disease and fluid around her heart.  She has been told that they have a familial risk for heart disease and she would like to have that checked today.  Had CT coronary calcium score done last year and it was 0.  She is compliant with all of her medications.  Reports home blood pressures typically run 120s to 130s over 70s to 80s.  She did not take any of her medicines this morning because she wanted to be fasting for labs.  Denies any chest pain, shortness of breath, change in exercise tolerance, edema  Reports some urinary incontinence and this seems to be urge incontinence because she holds it too long.  Wears a pad.  Does not want to be on medications yet for this.  Reports left wrist pain continues to be intermittent as is her right thigh pain.  Utilizes Zanaflex  if needed for that right thigh pain at bedtime because it helps her sleep.  Uses the Ativan  sparingly.  Denies any excessive daytime sedation, falls or respiratory depression with that medicine.  No memory changes.  She continues to be quite active  Occupation: Works at [employer], Marital status: Widowed, Substance use: None There are no preventive care reminders to display for this patient.   Immunization History  Administered Date(s) Administered   Fluad Trivalent(High Dose 65+) 06/23/2023   INFLUENZA, HIGH DOSE SEASONAL PF 07/22/2016, 03/18/2017, 03/22/2019, 04/06/2024   Influenza,inj,Quad PF,6+ Mos 05/10/2018   Influenza,inj,quad, With Preservative 04/06/2017   Moderna SARS-COV2 Booster Vaccination 09/12/2020   Moderna Sars-Covid-2 Vaccination 08/31/2019, 09/28/2019   Pneumococcal Conjugate-13 07/22/2016   Pneumococcal Polysaccharide-23 05/18/2019   Tdap 02/13/2015   Past Medical History:   Diagnosis Date   Anxiety    Arthritis    hands, knees   Closed trimalleolar fracture of right ankle 06/15/2020   Depression    GERD (gastroesophageal reflux disease)    Hyperlipidemia    Hypertension    Hypothyroidism    Thyroid  disease    Trimalleolar fracture of ankle, closed, right, initial encounter    Social History   Socioeconomic History   Marital status: Widowed    Spouse name: Nancyann   Number of children: 1   Years of education: Not on file   Highest education level: Associate degree: occupational, Scientist, product/process development, or vocational program  Occupational History   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    Christian   Works at [employer]   Has a son   Social Drivers of Corporate investment banker Strain: Low Risk  (04/07/2024)   Overall Financial Resource Strain (CARDIA)    Difficulty of Paying Living Expenses: Not hard at all  Food Insecurity: No Food Insecurity (04/07/2024)   Hunger Vital Sign    Worried About Running Out of Food in the Last Year: Never true  Ran Out of Food in the Last Year: Never true  Transportation Needs: No Transportation Needs (04/07/2024)   PRAPARE - Administrator, Civil Service (Medical): No    Lack of Transportation (Non-Medical): No  Physical Activity: Insufficiently Active (04/07/2024)   Exercise Vital Sign    Days of Exercise per Week: 4 days    Minutes of Exercise per Session: 20 min  Stress: No Stress Concern Present (04/07/2024)   [employer] of Occupational Health - Occupational Stress Questionnaire    Feeling of Stress: Only a little  Social  Connections: Moderately Integrated (04/07/2024)   Social Connection and Isolation Panel    Frequency of Communication with Friends and Family: More than three times a week    Frequency of Social Gatherings with Friends and Family: Twice a week    Attends Religious Services: More than 4 times per year    Active Member of [employer] or Organizations: Yes    Attends Banker Meetings: More than 4 times per year    Marital Status: Widowed  Intimate Partner Violence: Not At Risk (12/14/2023)   Humiliation, Afraid, Rape, and Kick questionnaire    Fear of Current or Ex-Partner: No    Emotionally Abused: No    Physically Abused: No    Sexually Abused: No   Past Surgical History:  Procedure Laterality Date   ABDOMINAL HYSTERECTOMY     CARPAL TUNNEL RELEASE Bilateral 2013   CHOLECYSTECTOMY     ORIF ANKLE FRACTURE Right 06/14/2020   Procedure: Open treatment right ankle trimalleolar fracture with internal fixation;  Surgeon: Kit Rush, MD;  Location: [facility];  Service: Orthopedics;  Laterality: Right;    Family History  Problem Relation Age of Onset   Hypertension Mother    Hyperlipidemia Mother    Hypertension Father    Hypertension Sister    Hypertension Sister    Coronary artery disease Sister    Coronary artery disease Sister    Hypertension Sister    Heart failure Sister    Hypertension Son    Hyperlipidemia Son    Colon cancer Neg Hx    Liver disease Neg Hx    Esophageal cancer Neg Hx    Breast cancer Neg Hx     Current Outpatient Medications:    diclofenac  Sodium (VOLTAREN ) 1 % GEL, Apply 2 g topically 3 (three) times daily as needed (wrist pain)., Disp: , Rfl:    amLODipine  (NORVASC ) 5 MG tablet, Take 1 tablet (5 mg total) by mouth daily., Disp: 100 tablet, Rfl: 3   escitalopram  (LEXAPRO ) 20 MG tablet, Take 1 tablet (20 mg total) by mouth daily., Disp: 100 tablet, Rfl: 3   levothyroxine  (SYNTHROID ) 137 MCG tablet, TAKE 1 TABLET BY MOUTH  DAILY  BEFORE BREAKFAST Except take 1.5 tabs Sundays, Disp: , Rfl:    LORazepam  (ATIVAN ) 0.5 MG tablet, Take 1 tablet (0.5 mg total) by mouth at bedtime as needed for anxiety (MUST LAST 90 DAYS!)., Disp: 90 tablet, Rfl: 1   omeprazole  (PRILOSEC) 20 MG capsule, Take 1 capsule (20 mg total) by mouth daily., Disp: 100 capsule, Rfl: 3   tiZANidine  (ZANAFLEX ) 4 MG tablet, Take 1 tablet (4 mg total) by mouth every 6 (six) hours as needed for muscle spasms., Disp: 60 tablet, Rfl: 0   valsartan -hydrochlorothiazide  (DIOVAN -HCT) 160-25 MG tablet, Take 1 tablet by mouth daily., Disp: 100 tablet, Rfl: 3  No Known Allergies   ROS: Review of Systems Pertinent items noted in HPI  and remainder of comprehensive ROS otherwise negative.    Physical exam BP (!) 146/97   Pulse 89   Temp 97.6 F (36.4 C)   Ht 5' 2 (1.575 m)   Wt 155 lb 2 oz (70.4 kg)   SpO2 96%   BMI 28.37 kg/m  General appearance: alert, cooperative, appears stated age, and no distress Head: Normocephalic, without obvious abnormality, atraumatic Eyes: negative findings: lids and lashes normal, conjunctivae and sclerae normal, corneas clear, and pupils equal, round, reactive to light and accomodation Ears: normal TM's and external ear canals both ears Nose: Nares normal. Septum midline. Mucosa normal. No drainage or sinus tenderness. Throat: lips, mucosa, and tongue normal; teeth and gums normal Neck: no adenopathy, no carotid bruit, supple, symmetrical, trachea midline, and thyroid  not enlarged, symmetric, no tenderness/mass/nodules Back: symmetric, no curvature. ROM normal. No CVA tenderness. Lungs: clear to auscultation bilaterally Heart: regular rate and rhythm, S1, S2 normal, no murmur, click, rub or gallop Abdomen: soft, non-tender; bowel sounds normal; no masses,  no organomegaly Extremities: extremities normal, atraumatic, no cyanosis or edema Pulses: 2+ and symmetric Skin: Skin color, texture, turgor normal. No rashes or  lesions Lymph nodes: Cervical, supraclavicular, and axillary nodes normal. Neurologic: Grossly normal      04/11/2024    8:19 AM 04/06/2024    9:36 AM 12/14/2023    1:29 PM  Depression screen PHQ 2/9  Decreased Interest 1 1 0  Down, Depressed, Hopeless 1 1 1   PHQ - 2 Score 2 2 1   Altered sleeping 1 2 0  Tired, decreased energy 1 3 0  Change in appetite 0 1 0  Feeling bad or failure about yourself  0 0 0  Trouble concentrating 0 2 0  Moving slowly or fidgety/restless 0 0 0  Suicidal thoughts 0 0 0  PHQ-9 Score 4 10 1   Difficult doing work/chores Not difficult at all Not difficult at all Not difficult at all      04/11/2024    8:19 AM 04/06/2024    9:36 AM 11/10/2023   11:43 AM 08/26/2023    7:58 AM  GAD 7 : Generalized Anxiety Score  Nervous, Anxious, on Edge 1 1 2 1   Control/stop worrying 1 1 2 1   Worry too much - different things 1 1 2 1   Trouble relaxing 0 1 2 1   Restless 0 1 1 0  Easily annoyed or irritable 0 1 0 0  Afraid - awful might happen 1 1 2  0  Total GAD 7 Score 4 7 11 4   Anxiety Difficulty Not difficult at all Not difficult at all      No results found for this or any previous visit (from the past 2160 hours).   Assessment/ Plan: Jenkins Rung here for annual physical exam.   Annual physical exam  Osteopenia of left femoral neck - Plan: CMP14+EGFR, VITAMIN D 25 Hydroxy (Vit-D Deficiency, Fractures)  Vitamin D deficiency - Plan: CMP14+EGFR, VITAMIN D 25 Hydroxy (Vit-D Deficiency, Fractures)  Hypothyroidism due to acquired atrophy of thyroid  - Plan: CMP14+EGFR, TSH + free T4, levothyroxine  (SYNTHROID ) 137 MCG tablet  Essential hypertension with goal blood pressure less than 130/80 - Plan: CMP14+EGFR, amLODipine  (NORVASC ) 5 MG tablet, valsartan -hydrochlorothiazide  (DIOVAN -HCT) 160-25 MG tablet  Mixed hyperlipidemia - Plan: Lipoprotein A (LPA), CMP14+EGFR, Lipid Panel  Generalized anxiety disorder with panic attacks - Plan: escitalopram  (LEXAPRO ) 20 MG tablet,  LORazepam  (ATIVAN ) 0.5 MG tablet  Recurrent major depressive disorder, in full remission - Plan: escitalopram  (LEXAPRO ) 20 MG  tablet  Primary insomnia - Plan: LORazepam  (ATIVAN ) 0.5 MG tablet  Musculoskeletal thigh pain, right - Plan: tiZANidine  (ZANAFLEX ) 4 MG tablet   Check fasting labs.  All issues are chronic and stable.  Up-to-date on UDS and CSC and the national narcotic database was reviewed with no red flags.  She seems to be coping independently with the death of her sister and does not wish for any grief counseling.  She will continue SSRI and Ativan  as needed.  Declines shingles vaccination today  Blood pressure not at goal here in office but has not taken her blood pressure medications and home blood pressure sound controlled  Counseled on healthy lifestyle choices, including diet (rich in fruits, vegetables and lean meats and low in salt and simple carbohydrates) and exercise (at least 30 minutes of moderate physical activity daily).  Patient to follow up 6 m for thyroid   Fizza Scales M. Jolinda, DO

## 2024-04-12 ENCOUNTER — Ambulatory Visit: Payer: Self-pay | Admitting: Family Medicine

## 2024-04-12 DIAGNOSIS — E559 Vitamin D deficiency, unspecified: Secondary | ICD-10-CM

## 2024-04-12 DIAGNOSIS — E876 Hypokalemia: Secondary | ICD-10-CM

## 2024-04-12 LAB — TSH+FREE T4
Free T4: 1.6 ng/dL (ref 0.82–1.77)
TSH: 1.01 u[IU]/mL (ref 0.450–4.500)

## 2024-04-12 LAB — CMP14+EGFR
ALT: 22 IU/L (ref 0–32)
AST: 22 IU/L (ref 0–40)
Albumin: 4.2 g/dL (ref 3.8–4.8)
Alkaline Phosphatase: 111 IU/L (ref 49–135)
BUN/Creatinine Ratio: 18 (ref 12–28)
BUN: 14 mg/dL (ref 8–27)
Bilirubin Total: 0.6 mg/dL (ref 0.0–1.2)
CO2: 26 mmol/L (ref 20–29)
Calcium: 9.5 mg/dL (ref 8.7–10.3)
Chloride: 98 mmol/L (ref 96–106)
Creatinine, Ser: 0.79 mg/dL (ref 0.57–1.00)
Globulin, Total: 2.5 g/dL (ref 1.5–4.5)
Glucose: 94 mg/dL (ref 70–99)
Potassium: 3.4 mmol/L — ABNORMAL LOW (ref 3.5–5.2)
Sodium: 140 mmol/L (ref 134–144)
Total Protein: 6.7 g/dL (ref 6.0–8.5)
eGFR: 79 mL/min/1.73 (ref >59)

## 2024-04-12 LAB — LIPID PANEL
Chol/HDL Ratio: 5.2 ratio — ABNORMAL HIGH (ref 0.0–4.4)
Cholesterol, Total: 234 mg/dL — ABNORMAL HIGH (ref 100–199)
HDL: 45 mg/dL (ref >39)
LDL Chol Calc (NIH): 140 mg/dL — ABNORMAL HIGH (ref 0–99)
Triglycerides: 274 mg/dL — ABNORMAL HIGH (ref 0–149)
VLDL Cholesterol Cal: 49 mg/dL — ABNORMAL HIGH (ref 5–40)

## 2024-04-12 LAB — LIPOPROTEIN A (LPA): Lipoprotein (a): 29.9 nmol/L (ref <75.0)

## 2024-04-12 LAB — VITAMIN D 25 HYDROXY (VIT D DEFICIENCY, FRACTURES): Vit D, 25-Hydroxy: 20.2 ng/mL — AB (ref 30.0–100.0)

## 2024-04-12 MED ORDER — VITAMIN D (ERGOCALCIFEROL) 1.25 MG (50000 UNIT) PO CAPS: 50000.0000 [IU] | ORAL_CAPSULE | ORAL | 3 refills | Status: AC

## 2024-04-12 MED ORDER — POTASSIUM CHLORIDE CRYS ER 10 MEQ PO TBCR: 10.0000 meq | EXTENDED_RELEASE_TABLET | Freq: Every day | ORAL | 3 refills | Status: AC

## 2024-04-12 NOTE — Telephone Encounter (Signed)
 Patient aware and verbalized understanding.

## 2024-04-25 ENCOUNTER — Ambulatory Visit: Attending: Family Medicine | Admitting: Physical Therapy

## 2024-04-25 ENCOUNTER — Other Ambulatory Visit: Payer: Self-pay

## 2024-04-25 ENCOUNTER — Encounter: Payer: Self-pay | Admitting: Physical Therapy

## 2024-04-25 DIAGNOSIS — M79651 Pain in right thigh: Secondary | ICD-10-CM | POA: Diagnosis not present

## 2024-04-25 DIAGNOSIS — M5459 Other low back pain: Secondary | ICD-10-CM | POA: Insufficient documentation

## 2024-04-25 DIAGNOSIS — M79652 Pain in left thigh: Secondary | ICD-10-CM | POA: Diagnosis present

## 2024-04-25 DIAGNOSIS — M47817 Spondylosis without myelopathy or radiculopathy, lumbosacral region: Secondary | ICD-10-CM | POA: Insufficient documentation

## 2024-04-25 NOTE — Therapy (Signed)
 OUTPATIENT PHYSICAL THERAPY THORACOLUMBAR EVALUATION   Patient Name: Catherine Mayer MRN: [id number] DOB:[birth year], 73 y.o., female Today's Date: 04/25/2024  END OF SESSION:  PT End of Session - 04/25/24 9062     Visit Number 1    Number of Visits 8    Date for Recertification  05/23/24    PT Start Time 0850    PT Stop Time 0935    PT Time Calculation (min) 45 min    Activity Tolerance Patient tolerated treatment well    Behavior During Therapy [facility] for tasks assessed/performed          Past Medical History:  Diagnosis Date   Anxiety    Arthritis    hands, knees   Closed trimalleolar fracture of right ankle 06/15/2020   Depression    GERD (gastroesophageal reflux disease)    Hyperlipidemia    Hypertension    Hypothyroidism    Thyroid  disease    Trimalleolar fracture of ankle, closed, right, initial encounter    Past Surgical History:  Procedure Laterality Date   ABDOMINAL HYSTERECTOMY     CARPAL TUNNEL RELEASE Bilateral 2013   CHOLECYSTECTOMY     ORIF ANKLE FRACTURE Right 06/14/2020   Procedure: Open treatment right ankle trimalleolar fracture with internal fixation;  Surgeon: Kit Rush, MD;  Location: [facility];  Service: Orthopedics;  Laterality: Right;    Patient Active Problem List   Diagnosis Date Noted   De Quervain's tenosynovitis, left 04/06/2024   Osteopenia of left femoral neck 04/22/2022   Vaginal intraepithelial neoplasia 08/30/2021   Generalized anxiety disorder with panic attacks 05/18/2019   Panic disorder 09/13/2018   Essential hypertension with goal blood pressure less than 130/80 07/22/2016   Hyperlipidemia 07/22/2016   Recurrent major depressive disorder, in full remission 07/22/2016   Vitamin D deficiency 07/22/2016   Hypothyroidism 12/19/2013   REFERRING PROVIDER: Norene Fielding DO  REFERRING DIAG: Musculoskeletal right pain, right.   DJD (degenerative joint disease).    Rationale for Evaluation and Treatment:  Rehabilitation  THERAPY DIAG:  Other low back pain  Pain in left thigh  ONSET DATE: Ongoing.    SUBJECTIVE:                                                                                                                                                                                           SUBJECTIVE STATEMENT: The patient presents to the clinic with occasionally grabbing and shooting pain into her right posterior thigh that has been ongoing for quite some time.  She reports to injury.  The pain will come on for no apparent reason.  PERTINENT HISTORY:  Please see above.  Right ankle ORIF.   PAIN:  Are you having pain? No  PRECAUTIONS: None  RED FLAGS: None   WEIGHT BEARING RESTRICTIONS: No  FALLS:  Has patient fallen in last 6 months? No  LIVING ENVIRONMENT: Lives in: House/apartment Has following equipment at home: None  OCCUPATION: Sales.  PLOF: Independent  PATIENT GOALS: Not have thigh pain.      OBJECTIVE:  Note: Objective measures were completed at Evaluation unless otherwise noted.  DIAGNOSTIC FINDINGS:  FINDINGS: Five non-rib-bearing lumbar vertebra. Mild broad-based dextroscoliotic curvature. Grade 1 anterolisthesis of L3 on L4 and L4 on L5. No fracture or compression deformity. Mild diffuse disc space narrowing and anterior spurring. Facet hypertrophy from L3-L4 through L5-S1. No pars defects or focal bone abnormalities. The included sacrum is intact. Sacroiliac joints are congruent with mild degenerative change.   IMPRESSION: 1. Mild diffuse degenerative disc disease and facet hypertrophy in the lumbar spine. 2. Grade 1 anterolisthesis of L3 on L4 and L4 on L5.  PATIENT SURVEYS:  ODI:  1/50.   POSTURE: No Significant postural limitations  PALPATION: No palpable tenderness.  LUMBAR ROM:   Full active trunk flexion (though actual intervertebral movement is hypomobile).  Active extension is 20 degrees.  LOWER EXTREMITY ROM:      WNL.    LOWER EXTREMITY MMT:    WNL.  LUMBAR SPECIAL TESTS:  Equal leg lengths. (-) SLR and FABER testing.  Diminished right Achilles reflex.  FUNCTIONAL TESTS:  5 times sit to stand: 19 seconds Timed up and go (TUG): 10 seconds.    GAIT:  TREATMENT DATE: Nustep at level 3 x 10 minutes f/b SKTC (long sustained holds) performed bilaterally, DKTC (long hold), right supine HSS and hip bridges.                                                                                                                                   PATIENT EDUCATION:  Education details: See below. Person educated: Patient Education method: Explanation, Demonstration, Tactile cues, Verbal cues, and Handouts Education comprehension: verbalized understanding, returned demonstration, and verbal cues required  HOME EXERCISE PROGRAM: SKTC  [[id number]]  SINGLE KNEE TO CHEST STRETCH - SKTC -  Repeat 3 Repetitions, Hold 1 Minute, Complete 1 Set, Perform 3 Times a Day  DOUBLE KNEE TO CHEST STRETCH - DKTC -  Repeat 3 Repetitions, Hold 1 Minute, Complete 1 Set, Perform 3 Times a Day  Bridges -  Repeat 15 Repetitions, Hold 2 Seconds, Complete 2 Sets, Perform 2 Times a Day  HAMSTRING STRETCH - SUPINE -  Repeat 3 Repetitions, Hold 1 Minute, Complete 1 Set, Perform 3 Times a Day  ASSESSMENT:  CLINICAL IMPRESSION: The patient presents to OPPT with c/o occasional right posterior thigh pain.   Her ODI score is 1/50.  Her pain will come on for no apparent reason.  Special testing is negative.  She leads a very  active lifestyle.  A X-ray f her lumbar spine revealed mild diffuse degenerative disc disease and facet hypertrophy in the lumbar spine and a grade 1 anterolisthesis of L3 on L4 and L4 on L5.  Patient will benefit from skilled physical therapy intervention to address pain and deficits.  OBJECTIVE IMPAIRMENTS: decreased activity tolerance and pain.   PERSONAL FACTORS: Time since onset of injury/illness/exacerbation  are also affecting patient's functional outcome.   REHAB POTENTIAL: Excellent  CLINICAL DECISION MAKING: Stable/uncomplicated  EVALUATION COMPLEXITY: Low   GOALS: Goals reviewed with patient? Yes  SHORT TERM GOALS: Target date: 05/23/24  Ind with a HEP. Goal status: INITIAL  2.  Eliminate right thigh pain.  Goal status: INITIAL   PLAN:  PT FREQUENCY: 2x/week  PT DURATION: 8 weeks  PLANNED INTERVENTIONS: 97110-Therapeutic exercises, 97530- Therapeutic activity, [id number]- Neuromuscular re-education, 97535- Self Care, 02859- Manual therapy, G0283- Electrical stimulation (unattended), 97035- Ultrasound, 79439 (1-2 muscles), 20561 (3+ muscles)- Dry Needling, Patient/Family education, Cryotherapy, and Moist heat.  PLAN FOR NEXT SESSION: Core exercise progression.  Body mechanics training and spinal protection techniques.  Modalities and STW/M as needed.     Lennie Dunnigan, [place], PT 04/25/2024, 10:27 AM

## 2024-05-03 ENCOUNTER — Ambulatory Visit: Admitting: *Deleted

## 2024-05-03 ENCOUNTER — Encounter: Payer: Self-pay | Admitting: *Deleted

## 2024-05-03 DIAGNOSIS — M79652 Pain in left thigh: Secondary | ICD-10-CM

## 2024-05-03 DIAGNOSIS — M5459 Other low back pain: Secondary | ICD-10-CM | POA: Diagnosis not present

## 2024-05-03 NOTE — Therapy (Signed)
 OUTPATIENT PHYSICAL THERAPY THORACOLUMBAR TREATMENT   Patient Name: Catherine Mayer MRN: [id number] DOB:05-Aug-1950, 73 y.o., female Today's Date: 05/03/2024  END OF SESSION:  PT End of Session - 05/03/24 0814     Visit Number 2    Number of Visits 8    Date for Recertification  05/23/24    PT Start Time 0804    PT Stop Time 0850    PT Time Calculation (min) 46 min          Past Medical History:  Diagnosis Date   Anxiety    Arthritis    hands, knees   Closed trimalleolar fracture of right ankle 06/15/2020   Depression    GERD (gastroesophageal reflux disease)    Hyperlipidemia    Hypertension    Hypothyroidism    Thyroid  disease    Trimalleolar fracture of ankle, closed, right, initial encounter    Past Surgical History:  Procedure Laterality Date   ABDOMINAL HYSTERECTOMY     CARPAL TUNNEL RELEASE Bilateral 2013   CHOLECYSTECTOMY     ORIF ANKLE FRACTURE Right 06/14/2020   Procedure: Open treatment right ankle trimalleolar fracture with internal fixation;  Surgeon: Kit Rush, MD;  Location: [facility];  Service: Orthopedics;  Laterality: Right;    Patient Active Problem List   Diagnosis Date Noted   De Quervain's tenosynovitis, left 04/06/2024   Osteopenia of left femoral neck 04/22/2022   Vaginal intraepithelial neoplasia 08/30/2021   Generalized anxiety disorder with panic attacks 05/18/2019   Panic disorder 09/13/2018   Essential hypertension with goal blood pressure less than 130/80 07/22/2016   Hyperlipidemia 07/22/2016   Recurrent major depressive disorder, in full remission 07/22/2016   Vitamin D deficiency 07/22/2016   Hypothyroidism 12/19/2013   REFERRING PROVIDER: Norene Fielding DO  REFERRING DIAG: Musculoskeletal right pain, right.   DJD (degenerative joint disease).    Rationale for Evaluation and Treatment: Rehabilitation  THERAPY DIAG:  Other low back pain  Pain in left thigh  ONSET DATE: Ongoing.    SUBJECTIVE:                                                                                                                                                                                            SUBJECTIVE STATEMENT: The patient presents to the clinic with occasionally grabbing and shooting pain into her right posterior thigh. Reports doing okay after Eval  PERTINENT HISTORY:  Please see above.  Right ankle ORIF.   PAIN:  Are you having pain? No  PRECAUTIONS: None  RED FLAGS: None   WEIGHT BEARING RESTRICTIONS: No  FALLS:  Has patient fallen in  last 6 months? No  LIVING ENVIRONMENT: Lives in: House/apartment Has following equipment at home: None  OCCUPATION: Sales.  PLOF: Independent  PATIENT GOALS: Not have thigh pain.      OBJECTIVE:  Note: Objective measures were completed at Evaluation unless otherwise noted.  DIAGNOSTIC FINDINGS:  FINDINGS: Five non-rib-bearing lumbar vertebra. Mild broad-based dextroscoliotic curvature. Grade 1 anterolisthesis of L3 on L4 and L4 on L5. No fracture or compression deformity. Mild diffuse disc space narrowing and anterior spurring. Facet hypertrophy from L3-L4 through L5-S1. No pars defects or focal bone abnormalities. The included sacrum is intact. Sacroiliac joints are congruent with mild degenerative change.   IMPRESSION: 1. Mild diffuse degenerative disc disease and facet hypertrophy in the lumbar spine. 2. Grade 1 anterolisthesis of L3 on L4 and L4 on L5.  PATIENT SURVEYS:  ODI:  1/50.   POSTURE: No Significant postural limitations  PALPATION: No palpable tenderness.  LUMBAR ROM:   Full active trunk flexion (though actual intervertebral movement is hypomobile).  Active extension is 20 degrees.  LOWER EXTREMITY ROM:     WNL.    LOWER EXTREMITY MMT:    WNL.  LUMBAR SPECIAL TESTS:  Equal leg lengths. (-) SLR and FABER testing.  Diminished right Achilles reflex.  FUNCTIONAL TESTS:  5 times sit to stand: 19  seconds Timed up and go (TUG): 10 seconds.    GAIT:  TREATMENT DATE:05/03/24   Nustep at level 3 x 10 minutes   SKTC (long sustained holds) performed bilaterally, DKTC (long hold), right supine HSS and hip bridges.    LT SL over bolster for positional traction x 13 mins Manual STW/ TPR to RT side LB paras as well as QL while in Positional traction                                                                                                                                 PATIENT EDUCATION:  Education details: See below. Person educated: Patient Education method: Explanation, Demonstration, Tactile cues, Verbal cues, and Handouts Education comprehension: verbalized understanding, returned demonstration, and verbal cues required  HOME EXERCISE PROGRAM: SKTC  [[id number]]  SINGLE KNEE TO CHEST STRETCH - SKTC -  Repeat 3 Repetitions, Hold 1 Minute, Complete 1 Set, Perform 3 Times a Day  DOUBLE KNEE TO CHEST STRETCH - DKTC -  Repeat 3 Repetitions, Hold 1 Minute, Complete 1 Set, Perform 3 Times a Day  Bridges -  Repeat 15 Repetitions, Hold 2 Seconds, Complete 2 Sets, Perform 2 Times a Day  HAMSTRING STRETCH - SUPINE -  Repeat 3 Repetitions, Hold 1 Minute, Complete 1 Set, Perform 3 Times a Day  ASSESSMENT:  CLINICAL IMPRESSION: The patient presents to OPPT with c/o occasional right posterior thigh pain. She was able to continue with therex with added positional traction and STW and did well today with decreased RT posterior thigh pain end of session.    OBJECTIVE IMPAIRMENTS: decreased activity tolerance and pain.  PERSONAL FACTORS: Time since onset of injury/illness/exacerbation are also affecting patient's functional outcome.   REHAB POTENTIAL: Excellent  CLINICAL DECISION MAKING: Stable/uncomplicated  EVALUATION COMPLEXITY: Low   GOALS: Goals reviewed with patient? Yes  SHORT TERM GOALS: Target date: 05/23/24  Ind with a HEP. Goal status: INITIAL  2.  Eliminate  right thigh pain.  Goal status: INITIAL   PLAN:  PT FREQUENCY/DURATION:  8 visits.  PLANNED INTERVENTIONS: 97110-Therapeutic exercises, 97530- Therapeutic activity, [id number]- Neuromuscular re-education, 97535- Self Care, 02859- Manual therapy, G0283- Electrical stimulation (unattended), 97035- Ultrasound, 20560 (1-2 muscles), 20561 (3+ muscles)- Dry Needling, Patient/Family education, Cryotherapy, and Moist heat.  PLAN FOR NEXT SESSION: Core exercise progression.  Body mechanics training and spinal protection techniques.  Modalities and STW/M as needed.     Kanai Berrios,CHRIS, PTA 05/03/2024, 10:40 AM

## 2024-05-17 ENCOUNTER — Encounter: Payer: Self-pay | Admitting: *Deleted

## 2024-05-17 ENCOUNTER — Ambulatory Visit: Attending: Family Medicine | Admitting: *Deleted

## 2024-05-17 DIAGNOSIS — M5459 Other low back pain: Secondary | ICD-10-CM | POA: Diagnosis present

## 2024-05-17 DIAGNOSIS — M79652 Pain in left thigh: Secondary | ICD-10-CM | POA: Insufficient documentation

## 2024-05-17 NOTE — Therapy (Signed)
 OUTPATIENT PHYSICAL THERAPY THORACOLUMBAR TREATMENT   Patient Name: Catherine Mayer MRN: [id number] DOB:05-Dec-1950, 73 y.o., female Today's Date: 05/17/2024  END OF SESSION:  PT End of Session - 05/17/24 0809     Visit Number 3    Number of Visits 8    Date for Recertification  05/23/24    PT Start Time 0807    PT Stop Time 0850    PT Time Calculation (min) 43 min          Past Medical History:  Diagnosis Date   Anxiety    Arthritis    hands, knees   Closed trimalleolar fracture of right ankle 06/15/2020   Depression    GERD (gastroesophageal reflux disease)    Hyperlipidemia    Hypertension    Hypothyroidism    Thyroid  disease    Trimalleolar fracture of ankle, closed, right, initial encounter    Past Surgical History:  Procedure Laterality Date   ABDOMINAL HYSTERECTOMY     CARPAL TUNNEL RELEASE Bilateral 2013   CHOLECYSTECTOMY     ORIF ANKLE FRACTURE Right 06/14/2020   Procedure: Open treatment right ankle trimalleolar fracture with internal fixation;  Surgeon: Kit Rush, MD;  Location: [facility];  Service: Orthopedics;  Laterality: Right;    Patient Active Problem List   Diagnosis Date Noted   De Quervain's tenosynovitis, left 04/06/2024   Osteopenia of left femoral neck 04/22/2022   Vaginal intraepithelial neoplasia 08/30/2021   Generalized anxiety disorder with panic attacks 05/18/2019   Panic disorder 09/13/2018   Essential hypertension with goal blood pressure less than 130/80 07/22/2016   Hyperlipidemia 07/22/2016   Recurrent major depressive disorder, in full remission 07/22/2016   Vitamin D deficiency 07/22/2016   Hypothyroidism 12/19/2013   REFERRING PROVIDER: Norene Fielding DO  REFERRING DIAG: Musculoskeletal right pain, right.   DJD (degenerative joint disease).    Rationale for Evaluation and Treatment: Rehabilitation  THERAPY DIAG:  Other low back pain  Pain in left thigh  ONSET DATE: Ongoing.    SUBJECTIVE:                                                                                                                                                                                            SUBJECTIVE STATEMENT: The patient arrived today reporting having several good days after last Rx. 1-2/10 this morning  PERTINENT HISTORY:  Please see above.  Right ankle ORIF.   PAIN:  Are you having pain? No  PRECAUTIONS: None  RED FLAGS: None   WEIGHT BEARING RESTRICTIONS: No  FALLS:  Has patient fallen in last 6 months? No  LIVING ENVIRONMENT:  Lives in: House/apartment Has following equipment at home: None  OCCUPATION: Sales.  PLOF: Independent  PATIENT GOALS: Not have thigh pain.      OBJECTIVE:  Note: Objective measures were completed at Evaluation unless otherwise noted.  DIAGNOSTIC FINDINGS:  FINDINGS: Five non-rib-bearing lumbar vertebra. Mild broad-based dextroscoliotic curvature. Grade 1 anterolisthesis of L3 on L4 and L4 on L5. No fracture or compression deformity. Mild diffuse disc space narrowing and anterior spurring. Facet hypertrophy from L3-L4 through L5-S1. No pars defects or focal bone abnormalities. The included sacrum is intact. Sacroiliac joints are congruent with mild degenerative change.   IMPRESSION: 1. Mild diffuse degenerative disc disease and facet hypertrophy in the lumbar spine. 2. Grade 1 anterolisthesis of L3 on L4 and L4 on L5.  PATIENT SURVEYS:  ODI:  1/50.   POSTURE: No Significant postural limitations  PALPATION: No palpable tenderness.  LUMBAR ROM:   Full active trunk flexion (though actual intervertebral movement is hypomobile).  Active extension is 20 degrees.  LOWER EXTREMITY ROM:     WNL.    LOWER EXTREMITY MMT:    WNL.  LUMBAR SPECIAL TESTS:  Equal leg lengths. (-) SLR and FABER testing.  Diminished right Achilles reflex.  FUNCTIONAL TESTS:  5 times sit to stand: 19 seconds Timed up and go (TUG): 10 seconds.     GAIT:  TREATMENT DATE:05/17/24   Nustep at level 3 x 11 minutes  Discussed HEP  SKTC , DKTC , supine HSS and hip bridges.    LT SL over bolster for positional traction x 15 mins Manual STW/ TPR to RT side LB paras as well as QL while in Positional traction LT SL                                                                                                                                  PATIENT EDUCATION:  Education details: See below. Person educated: Patient Education method: Explanation, Demonstration, Tactile cues, Verbal cues, and Handouts Education comprehension: verbalized understanding, returned demonstration, and verbal cues required  HOME EXERCISE PROGRAM: SKTC  [[id number]]  SINGLE KNEE TO CHEST STRETCH - SKTC -  Repeat 3 Repetitions, Hold 1 Minute, Complete 1 Set, Perform 3 Times a Day  DOUBLE KNEE TO CHEST STRETCH - DKTC -  Repeat 3 Repetitions, Hold 1 Minute, Complete 1 Set, Perform 3 Times a Day  Bridges -  Repeat 15 Repetitions, Hold 2 Seconds, Complete 2 Sets, Perform 2 Times a Day  HAMSTRING STRETCH - SUPINE -  Repeat 3 Repetitions, Hold 1 Minute, Complete 1 Set, Perform 3 Times a Day  ASSESSMENT:  CLINICAL IMPRESSION: Pt arrived today doing fairly well after last Rx and reports having several days without RT LE pain.  She was able to continue with therex and positional traction as well as  STW. LTG #1 met    OBJECTIVE IMPAIRMENTS: decreased activity tolerance and pain.   PERSONAL FACTORS: Time since onset of injury/illness/exacerbation  are also affecting patient's functional outcome.   REHAB POTENTIAL: Excellent  CLINICAL DECISION MAKING: Stable/uncomplicated  EVALUATION COMPLEXITY: Low   GOALS: Goals reviewed with patient? Yes  SHORT TERM GOALS: Target date: 05/23/24  Ind with a HEP. Goal status: Met  2.  Eliminate right thigh pain.  Goal status: INITIAL   PLAN:  PT FREQUENCY/DURATION:  8 visits.  PLANNED INTERVENTIONS:  97110-Therapeutic exercises, 97530- Therapeutic activity, [id number]- Neuromuscular re-education, 97535- Self Care, 02859- Manual therapy, G0283- Electrical stimulation (unattended), 97035- Ultrasound, 20560 (1-2 muscles), 20561 (3+ muscles)- Dry Needling, Patient/Family education, Cryotherapy, and Moist heat.  PLAN FOR NEXT SESSION: Core exercise progression.  Body mechanics training and spinal protection techniques.  Modalities and STW/M as needed.     Freemon Binford,CHRIS, PTA 05/17/2024, 8:59 AM

## 2024-05-24 ENCOUNTER — Encounter: Payer: Self-pay | Admitting: *Deleted

## 2024-05-24 ENCOUNTER — Ambulatory Visit: Admitting: *Deleted

## 2024-05-24 DIAGNOSIS — M79652 Pain in left thigh: Secondary | ICD-10-CM

## 2024-05-24 DIAGNOSIS — M5459 Other low back pain: Secondary | ICD-10-CM

## 2024-05-24 NOTE — Therapy (Signed)
 OUTPATIENT PHYSICAL THERAPY THORACOLUMBAR TREATMENT   Patient Name: Catherine Mayer MRN: [id number] DOB:03/19/1951, 73 y.o., female Today's Date: 05/24/2024  END OF SESSION:  PT End of Session - 05/24/24 0816     Visit Number 4    Number of Visits 8    Date for Recertification  05/23/24    PT Start Time 0805    PT Stop Time 0850    PT Time Calculation (min) 45 min          Past Medical History:  Diagnosis Date   Anxiety    Arthritis    hands, knees   Closed trimalleolar fracture of right ankle 06/15/2020   Depression    GERD (gastroesophageal reflux disease)    Hyperlipidemia    Hypertension    Hypothyroidism    Thyroid  disease    Trimalleolar fracture of ankle, closed, right, initial encounter    Past Surgical History:  Procedure Laterality Date   ABDOMINAL HYSTERECTOMY     CARPAL TUNNEL RELEASE Bilateral 2013   CHOLECYSTECTOMY     ORIF ANKLE FRACTURE Right 06/14/2020   Procedure: Open treatment right ankle trimalleolar fracture with internal fixation;  Surgeon: Kit Rush, MD;  Location: [facility];  Service: Orthopedics;  Laterality: Right;    Patient Active Problem List   Diagnosis Date Noted   De Quervain's tenosynovitis, left 04/06/2024   Osteopenia of left femoral neck 04/22/2022   Vaginal intraepithelial neoplasia 08/30/2021   Generalized anxiety disorder with panic attacks 05/18/2019   Panic disorder 09/13/2018   Essential hypertension with goal blood pressure less than 130/80 07/22/2016   Hyperlipidemia 07/22/2016   Recurrent major depressive disorder, in full remission 07/22/2016   Vitamin D deficiency 07/22/2016   Hypothyroidism 12/19/2013   REFERRING PROVIDER: Norene Fielding DO  REFERRING DIAG: Musculoskeletal right pain, right.   DJD (degenerative joint disease).    Rationale for Evaluation and Treatment: Rehabilitation  THERAPY DIAG:  Other low back pain  Pain in left thigh  ONSET DATE: Ongoing.    SUBJECTIVE:                                                                                                                                                                                            SUBJECTIVE STATEMENT: The patient arrived today reporting having several good days since last Rx. 1/10 this morning. Doing better, just tight on RT side  PERTINENT HISTORY:  Please see above.  Right ankle ORIF.   PAIN:  Are you having pain? No  PRECAUTIONS: None  RED FLAGS: None   WEIGHT BEARING RESTRICTIONS: No  FALLS:  Has patient fallen in  last 6 months? No  LIVING ENVIRONMENT: Lives in: House/apartment Has following equipment at home: None  OCCUPATION: Sales.  PLOF: Independent  PATIENT GOALS: Not have thigh pain.      OBJECTIVE:  Note: Objective measures were completed at Evaluation unless otherwise noted.  DIAGNOSTIC FINDINGS:  FINDINGS: Five non-rib-bearing lumbar vertebra. Mild broad-based dextroscoliotic curvature. Grade 1 anterolisthesis of L3 on L4 and L4 on L5. No fracture or compression deformity. Mild diffuse disc space narrowing and anterior spurring. Facet hypertrophy from L3-L4 through L5-S1. No pars defects or focal bone abnormalities. The included sacrum is intact. Sacroiliac joints are congruent with mild degenerative change.   IMPRESSION: 1. Mild diffuse degenerative disc disease and facet hypertrophy in the lumbar spine. 2. Grade 1 anterolisthesis of L3 on L4 and L4 on L5.  PATIENT SURVEYS:  ODI:  1/50.   POSTURE: No Significant postural limitations  PALPATION: No palpable tenderness.  LUMBAR ROM:   Full active trunk flexion (though actual intervertebral movement is hypomobile).  Active extension is 20 degrees.  LOWER EXTREMITY ROM:     WNL.    LOWER EXTREMITY MMT:    WNL.  LUMBAR SPECIAL TESTS:  Equal leg lengths. (-) SLR and FABER testing.  Diminished right Achilles reflex.  FUNCTIONAL TESTS:  5 times sit to stand: 19 seconds Timed up and  go (TUG): 10 seconds.    GAIT:  TREATMENT DATE:05/24/24   LB RT side   Nustep at level 3 x 13 minutes  Discussed HEP  SKTC , DKTC , supine HSS and hip bridges.    LT SL over bolster for positional traction x 15 mins Manual STW/ TPR to RT side LB paras as well as QL while in Positional traction LT SL                                                                                                                                  PATIENT EDUCATION:  Education details: See below. Person educated: Patient Education method: Explanation, Demonstration, Tactile cues, Verbal cues, and Handouts Education comprehension: verbalized understanding, returned demonstration, and verbal cues required  HOME EXERCISE PROGRAM: SKTC  [[id number]]  SINGLE KNEE TO CHEST STRETCH - SKTC -  Repeat 3 Repetitions, Hold 1 Minute, Complete 1 Set, Perform 3 Times a Day  DOUBLE KNEE TO CHEST STRETCH - DKTC -  Repeat 3 Repetitions, Hold 1 Minute, Complete 1 Set, Perform 3 Times a Day  Bridges -  Repeat 15 Repetitions, Hold 2 Seconds, Complete 2 Sets, Perform 2 Times a Day  HAMSTRING STRETCH - SUPINE -  Repeat 3 Repetitions, Hold 1 Minute, Complete 1 Set, Perform 3 Times a Day  ASSESSMENT:  CLINICAL IMPRESSION: Pt arrived today doing fairly well after last Rx and reports having several days without RT LE pain now , but with mainly RT side tightness. Pt continues to progress towards goals. She was able to continue with therex and positional traction as well  as well as STW. Decreased tenderness RT QL     OBJECTIVE IMPAIRMENTS: decreased activity tolerance and pain.   PERSONAL FACTORS: Time since onset of injury/illness/exacerbation are also affecting patient's functional outcome.   REHAB POTENTIAL: Excellent  CLINICAL DECISION MAKING: Stable/uncomplicated  EVALUATION COMPLEXITY: Low   GOALS: Goals reviewed with patient? Yes  SHORT TERM GOALS: Target date: 05/23/24  Ind with a HEP. Goal status:  Met  2.  Eliminate right thigh pain.  Goal status: INITIAL   PLAN:  PT FREQUENCY/DURATION:  8 visits.  PLANNED INTERVENTIONS: 97110-Therapeutic exercises, 97530- Therapeutic activity, [id number]- Neuromuscular re-education, 97535- Self Care, 02859- Manual therapy, G0283- Electrical stimulation (unattended), 97035- Ultrasound, 20560 (1-2 muscles), 20561 (3+ muscles)- Dry Needling, Patient/Family education, Cryotherapy, and Moist heat.  PLAN FOR NEXT SESSION: Continue with positional traction and   Modalities and STW/M as needed.     Sharonlee Nine,CHRIS, PTA 05/24/2024, 11:47 AM

## 2024-05-31 ENCOUNTER — Ambulatory Visit: Admitting: *Deleted

## 2024-06-07 ENCOUNTER — Ambulatory Visit: Admitting: *Deleted

## 2024-06-07 NOTE — Therapy (Deleted)
 OUTPATIENT PHYSICAL THERAPY THORACOLUMBAR TREATMENT   Patient Name: Catherine Mayer MRN: [id number] DOB:10-Jun-1951, 73 y.o., female Today's Date: 06/07/2024  END OF SESSION:    Past Medical History:  Diagnosis Date   Anxiety    Arthritis    hands, knees   Closed trimalleolar fracture of right ankle 06/15/2020   Depression    GERD (gastroesophageal reflux disease)    Hyperlipidemia    Hypertension    Hypothyroidism    Thyroid  disease    Trimalleolar fracture of ankle, closed, right, initial encounter    Past Surgical History:  Procedure Laterality Date   ABDOMINAL HYSTERECTOMY     CARPAL TUNNEL RELEASE Bilateral 2013   CHOLECYSTECTOMY     ORIF ANKLE FRACTURE Right 06/14/2020   Procedure: Open treatment right ankle trimalleolar fracture with internal fixation;  Surgeon: Kit Rush, MD;  Location: [facility];  Service: Orthopedics;  Laterality: Right;    Patient Active Problem List   Diagnosis Date Noted   De Quervain's tenosynovitis, left 04/06/2024   Osteopenia of left femoral neck 04/22/2022   Vaginal intraepithelial neoplasia 08/30/2021   Generalized anxiety disorder with panic attacks 05/18/2019   Panic disorder 09/13/2018   Essential hypertension with goal blood pressure less than 130/80 07/22/2016   Hyperlipidemia 07/22/2016   Recurrent major depressive disorder, in full remission 07/22/2016   Vitamin D  deficiency 07/22/2016   Hypothyroidism 12/19/2013   REFERRING PROVIDER: Norene Fielding DO  REFERRING DIAG: Musculoskeletal right pain, right.   DJD (degenerative joint disease).    Rationale for Evaluation and Treatment: Rehabilitation  THERAPY DIAG:  No diagnosis found.  ONSET DATE: Ongoing.    SUBJECTIVE:                                                                                                                                                                                           SUBJECTIVE STATEMENT: The patient arrived today  reporting having several good days since last Rx. 1/10 this morning. Doing better, just tight on RT side  PERTINENT HISTORY:  Please see above.  Right ankle ORIF.   PAIN:  Are you having pain? No  PRECAUTIONS: None  RED FLAGS: None   WEIGHT BEARING RESTRICTIONS: No  FALLS:  Has patient fallen in last 6 months? No  LIVING ENVIRONMENT: Lives in: House/apartment Has following equipment at home: None  OCCUPATION: Sales.  PLOF: Independent  PATIENT GOALS: Not have thigh pain.      OBJECTIVE:  Note: Objective measures were completed at Evaluation unless otherwise noted.  DIAGNOSTIC FINDINGS:  FINDINGS: Five non-rib-bearing lumbar vertebra. Mild broad-based dextroscoliotic curvature. Grade 1 anterolisthesis of L3 on  L4 and L4 on L5. No fracture or compression deformity. Mild diffuse disc space narrowing and anterior spurring. Facet hypertrophy from L3-L4 through L5-S1. No pars defects or focal bone abnormalities. The included sacrum is intact. Sacroiliac joints are congruent with mild degenerative change.   IMPRESSION: 1. Mild diffuse degenerative disc disease and facet hypertrophy in the lumbar spine. 2. Grade 1 anterolisthesis of L3 on L4 and L4 on L5.  PATIENT SURVEYS:  ODI:  1/50.   POSTURE: No Significant postural limitations  PALPATION: No palpable tenderness.  LUMBAR ROM:   Full active trunk flexion (though actual intervertebral movement is hypomobile).  Active extension is 20 degrees.  LOWER EXTREMITY ROM:     WNL.    LOWER EXTREMITY MMT:    WNL.  LUMBAR SPECIAL TESTS:  Equal leg lengths. (-) SLR and FABER testing.  Diminished right Achilles reflex.  FUNCTIONAL TESTS:  5 times sit to stand: 19 seconds Timed up and go (TUG): 10 seconds.    GAIT:  TREATMENT DATE: 06/07/24 ***  05/24/24   LB RT side   Nustep at level 3 x 13 minutes  Discussed HEP  SKTC , DKTC , supine HSS and hip bridges.    LT SL over bolster for positional traction x  15 mins Manual STW/ TPR to RT side LB paras as well as QL while in Positional traction LT SL                                                                                                                                  PATIENT EDUCATION:  Education details: See below. Person educated: Patient Education method: Explanation, Demonstration, Tactile cues, Verbal cues, and Handouts Education comprehension: verbalized understanding, returned demonstration, and verbal cues required  HOME EXERCISE PROGRAM: SKTC  [[id number]]  SINGLE KNEE TO CHEST STRETCH - SKTC -  Repeat 3 Repetitions, Hold 1 Minute, Complete 1 Set, Perform 3 Times a Day  DOUBLE KNEE TO CHEST STRETCH - DKTC -  Repeat 3 Repetitions, Hold 1 Minute, Complete 1 Set, Perform 3 Times a Day  Bridges -  Repeat 15 Repetitions, Hold 2 Seconds, Complete 2 Sets, Perform 2 Times a Day  HAMSTRING STRETCH - SUPINE -  Repeat 3 Repetitions, Hold 1 Minute, Complete 1 Set, Perform 3 Times a Day  ASSESSMENT:  CLINICAL IMPRESSION: *** Pt arrived today doing fairly well after last Rx and reports having several days without RT LE pain now , but with mainly RT side tightness. Pt continues to progress towards goals. She was able to continue with therex and positional traction as well as well as STW. Decreased tenderness RT QL     OBJECTIVE IMPAIRMENTS: decreased activity tolerance and pain.   PERSONAL FACTORS: Time since onset of injury/illness/exacerbation are also affecting patient's functional outcome.   REHAB POTENTIAL: Excellent  CLINICAL DECISION MAKING: Stable/uncomplicated  EVALUATION COMPLEXITY: Low   GOALS: Goals reviewed with patient? Yes  SHORT TERM GOALS:  Target date: 05/23/24  Ind with a HEP. Goal status: Met  2.  Eliminate right thigh pain.  Goal status: INITIAL   PLAN:  PT FREQUENCY/DURATION:  8 visits.  PLANNED INTERVENTIONS: 97110-Therapeutic exercises, 97530- Therapeutic activity, [id number]- Neuromuscular  re-education, 97535- Self Care, 02859- Manual therapy, G0283- Electrical stimulation (unattended), 97035- Ultrasound, 20560 (1-2 muscles), 20561 (3+ muscles)- Dry Needling, Patient/Family education, Cryotherapy, and Moist heat.  PLAN FOR NEXT SESSION: Continue with positional traction and   Modalities and STW/M as needed.     Meliana Canner April Ma L Hakan Nudelman, PT 06/07/2024, 7:47 AM

## 2024-06-22 ENCOUNTER — Ambulatory Visit: Admitting: Family Medicine

## 2024-06-22 ENCOUNTER — Encounter: Payer: Self-pay | Admitting: Family Medicine

## 2024-06-22 VITALS — BP 141/91 | HR 74 | Temp 97.3°F | Ht 61.0 in | Wt 154.1 lb

## 2024-06-22 DIAGNOSIS — M654 Radial styloid tenosynovitis [de Quervain]: Secondary | ICD-10-CM

## 2024-06-22 MED ORDER — OMEPRAZOLE 20 MG PO CPDR: 20.0000 mg | DELAYED_RELEASE_CAPSULE | Freq: Every day | ORAL | 3 refills | Status: AC

## 2024-06-22 MED ORDER — METHYLPREDNISOLONE ACETATE 80 MG/ML IJ SUSP
40.0000 mg | Freq: Once | INTRAMUSCULAR | Status: AC
  Administered 2024-06-22: 11:00:00 40 mg via INTRA_ARTICULAR

## 2024-06-22 NOTE — Addendum Note (Signed)
 Addended by: SHERRE SUZEN PARAS on: 06/22/2024 11:10 AM   Modules accepted: Orders

## 2024-06-22 NOTE — Progress Notes (Signed)
 Subjective: RR:tmpdu apin PCP: Jolinda Norene HERO, DO HPI:Catherine Mayer is a 73 y.o. female presenting to clinic today for:  She reports onset of left wrist pain 2 months ago. No injury. Has pain with gripping motions. Not constant but can be debilitating in the sense she feels like she will drop things like point things out of the oven.  She has mild soft tissue swelling over the wrist.  Points to the area just above the anatomic snuffbox on the left. No sensory changes   ROS: Per HPI  Allergies[1] Past Medical History:  Diagnosis Date   Anxiety    Arthritis    hands, knees   Closed trimalleolar fracture of right ankle 06/15/2020   Depression    GERD (gastroesophageal reflux disease)    Hyperlipidemia    Hypertension    Hypothyroidism    Thyroid  disease    Trimalleolar fracture of ankle, closed, right, initial encounter    Current Medications[2] Social History   Socioeconomic History   Marital status: Widowed    Spouse name: Catherine Mayer   Number of children: 1   Years of education: Not on file   Highest education level: Associate degree: occupational, scientist, product/process development, or vocational program  Occupational History   Occupation: control and instrumentation engineer    Comment: part time  Tobacco Use   Smoking status: Never   Smokeless tobacco: Never  Vaping Use   Vaping status: Never Used  Substance and Sexual Activity   Alcohol use: Not Currently   Drug use: Never   Sexual activity: Not on file  Other Topics Concern   Not on file  Social History Narrative   Husband passed away 25-Jul-2021   Enjoys playing with dogs and being outside.    Christian   Works at [employer]   Has a son   Social Drivers of Health   Tobacco Use: Low Risk (06/22/2024)   Patient History    Smoking Tobacco Use: Never    Smokeless Tobacco Use: Never    Passive Exposure: Not on file  Financial Resource Strain: Low Risk (06/18/2024)   Overall Financial Resource Strain (CARDIA)    Difficulty of Paying Living  Expenses: Not very hard  Food Insecurity: No Food Insecurity (06/18/2024)   Epic    Worried About Programme Researcher, Broadcasting/film/video in the Last Year: Never true    Ran Out of Food in the Last Year: Never true  Transportation Needs: No Transportation Needs (06/18/2024)   Epic    Lack of Transportation (Medical): No    Lack of Transportation (Non-Medical): No  Physical Activity: Inactive (06/18/2024)   Exercise Vital Sign    Days of Exercise per Week: 3 days    Minutes of Exercise per Session: 0 min  Stress: No Stress Concern Present (06/18/2024)   [employer] of Occupational Health - Occupational Stress Questionnaire    Feeling of Stress: Only a little  Social Connections: Moderately Isolated (06/18/2024)   Social Connection and Isolation Panel    Frequency of Communication with Friends and Family: More than three times a week    Frequency of Social Gatherings with Friends and Family: Once a week    Attends Religious Services: More than 4 times per year    Active Member of [employer] or Organizations: No    Attends Banker Meetings: Not on file    Marital Status: Widowed  Intimate Partner Violence: Not At Risk (12/14/2023)   Humiliation, Afraid, Rape, and Kick questionnaire    Fear of Current  or Ex-Partner: No    Emotionally Abused: No    Physically Abused: No    Sexually Abused: No  Depression (PHQ2-9): Medium Risk (06/22/2024)   Depression (PHQ2-9)    PHQ-2 Score: 5  Alcohol Screen: Low Risk (12/14/2023)   Alcohol Screen    Last Alcohol Screening Score (AUDIT): 0  Housing: Unknown (06/18/2024)   Epic    Unable to Pay for Housing in the Last Year: Not on file    Number of Times Moved in the Last Year: Not on file    Homeless in the Last Year: No  Utilities: Not At Risk (12/14/2023)   [employer] Utilities    Threatened with loss of utilities: No  Health Literacy: Adequate Health Literacy (12/14/2023)   B1300 Health Literacy    Frequency of need for help with medical instructions: Never    Family History  Problem Relation Age of Onset   Hypertension Mother    Hyperlipidemia Mother    Hypertension Father    Hypertension Sister    Hypertension Sister    Coronary artery disease Sister    Coronary artery disease Sister    Hypertension Sister    Heart failure Sister    Hypertension Son    Hyperlipidemia Son    Colon cancer Neg Hx    Liver disease Neg Hx    Esophageal cancer Neg Hx    Breast cancer Neg Hx     Objective: Office vital signs reviewed. BP (!) 141/91   Pulse 74   Temp (!) 97.3 F (36.3 C)   Ht 5' 1 (1.549 m)   Wt 154 lb 2 oz (69.9 kg)   SpO2 95%   BMI 29.12 kg/m   Physical Examination:  General: Awake, alert, well nourished, No acute distress MSK: Mild soft tissue swelling over the distal radial and of the left wrist.  She has positive Terrilee test.  Procedure observed by Catherine Search, FNP.  Tendon Sheath INJECTION:  Patient denies allergy to antiseptics (including iodine) and anesthetics.  Patient without h/o diabetes, frequent steroid use, use of blood thinners/ antiplatelets.  Patient was given informed consent and a signed copy has been placed in the chart. Appropriate time out was taken. Area prepped and draped in usual sterile fashion. Anatomic landmarks were identified and injection site was marked.  Ethyl chloride spray was used to numb the area and 1 cc of methylprednisolone  40 mg/ml plus  1 cc of 1% lidocaine  with epinephrine was injected into the tendon sheath of the left radial aspect of the wrise using. The patient tolerated the procedure well and there were no immediate complications. Estimated blood loss is less than 0.5 cc.  Post procedure instructions were reviewed and handout outlining these instructions were provided to patient.   Assessment/ Plan: 73 y.o. female   De Quervain's tenosynovitis, left   Treated with corticosteroid injection.  No immediate complications.  Tolerated procedure without difficulty.  Home care  instructions reviewed and reasons for reevaluation discussed.  Follow-up as needed   Norene CHRISTELLA Fielding, DO [facility] [phone]     [1] No Known Allergies [2]  Current Outpatient Medications:    amLODipine  (NORVASC ) 5 MG tablet, Take 1 tablet (5 mg total) by mouth daily., Disp: 100 tablet, Rfl: 3   diclofenac  Sodium (VOLTAREN ) 1 % GEL, Apply 2 g topically 3 (three) times daily as needed (wrist pain)., Disp: , Rfl:    escitalopram  (LEXAPRO ) 20 MG tablet, Take 1 tablet (20 mg total) by  mouth daily., Disp: 100 tablet, Rfl: 3   levothyroxine  (SYNTHROID ) 137 MCG tablet, TAKE 1 TABLET BY MOUTH  DAILY BEFORE BREAKFAST Except take 1.5 tabs Sundays, Disp: , Rfl:    LORazepam  (ATIVAN ) 0.5 MG tablet, Take 1 tablet (0.5 mg total) by mouth at bedtime as needed for anxiety (MUST LAST 90 DAYS!)., Disp: 90 tablet, Rfl: 1   potassium chloride  (KLOR-CON  M) 10 MEQ tablet, Take 1 tablet (10 mEq total) by mouth daily., Disp: 90 tablet, Rfl: 3   tiZANidine  (ZANAFLEX ) 4 MG tablet, Take 1 tablet (4 mg total) by mouth every 6 (six) hours as needed for muscle spasms., Disp: 60 tablet, Rfl: 0   valsartan -hydrochlorothiazide  (DIOVAN -HCT) 160-25 MG tablet, Take 1 tablet by mouth daily., Disp: 100 tablet, Rfl: 3   Vitamin D , Ergocalciferol , (DRISDOL ) 1.25 MG (50000 UNIT) CAPS capsule, Take 1 capsule (50,000 Units total) by mouth every 7 (seven) days., Disp: 12 capsule, Rfl: 3   omeprazole  (PRILOSEC) 20 MG capsule, Take 1 capsule (20 mg total) by mouth daily., Disp: 100 capsule, Rfl: 3

## 2024-06-22 NOTE — Patient Instructions (Signed)
 Irritation and Swelling of the Thumb (De Quervain's Syndrome): What to Know  Tommi Rumps Quervain's syndrome causes irritation and swelling of the tendon on the thumb side of the wrist. Tendons are strong tissues that connect muscle to bone.  The tendons in the hand go through a tunnel called a sheath. A slippery layer of tissue helps the tendons move through the sheath. With de Quervain's syndrome, the sheath swells or thickens. This can cause pressure and pain. De Quervain's syndrome is also called de Quervain's disease or de Quervain's tenosynovitis. What are the causes? The exact cause isn't known. It may be from overuse of the hand and wrist. What increases the risk? You're more likely to get [employer] syndrome if: You use your hands far more than normal. This includes if you do certain movements over and over, such as twisting your hand or using a tight grip. You're pregnant. You're female and middle-aged. You have rheumatoid arthritis. You have diabetes. What are the signs or symptoms? The main symptom is pain on the thumb side of your wrist. The pain may get worse when you grasp something or turn your wrist. You may also have: Pain that goes up your forearm. Swelling of your wrist and hand. Trouble moving your thumb and wrist. A feeling of snapping in the wrist. A cyst. This is a pocket of fluid. How is this diagnosed? You may be diagnosed based on your symptoms, medical history, and an exam. The exam may include a Lourena Simmonds test. This is when your health care provider pulls your thumb and wrist to see if it causes pain. You may also have tests. These may include: An X-ray. An MRI. An ultrasound. How is this treated? You may need to: Avoid doing things that: Cause pain or swelling. Cause you to make the same movements with your hand or thumb over and over. Take medicines. These may include: Medicines to help with pain. Steroids to help with irritation and swelling. Wear a  splint. Have surgery. This may be needed if other treatments don't work. Once the pain and swelling have gone down, you may start: Physical therapy. You may be given exercises to help with movement and strength in your wrist and thumb. Occupational therapy. This includes changing how you move your wrist. Follow these instructions at home: If you have a splint: Wear the splint as told. Take it off only if your provider says you can. Check the skin around it every day. Tell your provider if you see problems. Loosen the splint if your fingers tingle, are numb, or turn cold and blue. Keep the splint clean. If the splint isn't waterproof: Do not let it get wet. Cover it when you take a bath or shower. Use a cover that doesn't let any water in. Managing pain, stiffness, and swelling  Use ice or an ice pack as told. If you have a splint that you can take off, remove it only as told. Place a towel between your skin and the ice. Leave the ice on for 20 minutes, 2-3 times a day. If your skin turns red, take off the ice right away to prevent skin damage. The risk of damage is higher if you can't feel pain, heat, or cold. Move your fingers often to reduce stiffness and swelling. Raise your hand above the level of your heart while you're sitting or lying down. Use pillows as needed. General instructions Take your medicines only as told. Ask if it's OK for you to lift. Ask  when it's safe to drive if you have a splint on your hand. Ask what things are safe for you to do at home. Ask when you can go back to work or school. Where to find more information To learn more: Go to orthoinfo.aaos.org. Click "Search." Type "De Quervain's tenosynovitis" into the search bar. Contact a health care provider if: Your pain doesn't get better with medicine. Your pain gets worse. You get new symptoms. This information is not intended to replace advice given to you by your health care provider. Make sure you  discuss any questions you have with your health care provider. Document Revised: 02/16/2023 Document Reviewed: 02/16/2023 Elsevier Patient Education  2024 [employer].

## 2024-08-08 ENCOUNTER — Encounter: Payer: Self-pay | Admitting: Family Medicine

## 2024-08-09 ENCOUNTER — Telehealth: Admitting: Family Medicine

## 2024-08-09 ENCOUNTER — Encounter: Payer: Self-pay | Admitting: Family Medicine

## 2024-08-09 DIAGNOSIS — J329 Chronic sinusitis, unspecified: Secondary | ICD-10-CM

## 2024-08-09 DIAGNOSIS — Z8249 Family history of ischemic heart disease and other diseases of the circulatory system: Secondary | ICD-10-CM

## 2024-08-09 MED ORDER — AMOXICILLIN-POT CLAVULANATE 875-125 MG PO TABS: 1.0000 | ORAL_TABLET | Freq: Two times a day (BID) | ORAL | 0 refills | Status: AC

## 2024-10-17 ENCOUNTER — Ambulatory Visit: Payer: Self-pay | Admitting: Family Medicine

## 2024-12-14 ENCOUNTER — Ambulatory Visit: Payer: Self-pay
